# Patient Record
Sex: Female | Born: 1970 | Race: White | Hispanic: No | Marital: Single | Smoking: Current every day smoker
Health system: Southern US, Community
[De-identification: ages and names within clinical notes are randomized; demographics above are authoritative.]

## PROBLEM LIST (undated history)

## (undated) DIAGNOSIS — K7460 Unspecified cirrhosis of liver: Secondary | ICD-10-CM

## (undated) DIAGNOSIS — B1920 Unspecified viral hepatitis C without hepatic coma: Secondary | ICD-10-CM

## (undated) DIAGNOSIS — E785 Hyperlipidemia, unspecified: Secondary | ICD-10-CM

## (undated) DIAGNOSIS — J45909 Unspecified asthma, uncomplicated: Secondary | ICD-10-CM

## (undated) DIAGNOSIS — D649 Anemia, unspecified: Secondary | ICD-10-CM

## (undated) DIAGNOSIS — J449 Chronic obstructive pulmonary disease, unspecified: Secondary | ICD-10-CM

## (undated) DIAGNOSIS — J439 Emphysema, unspecified: Secondary | ICD-10-CM

## (undated) DIAGNOSIS — I10 Essential (primary) hypertension: Secondary | ICD-10-CM

## (undated) DIAGNOSIS — K219 Gastro-esophageal reflux disease without esophagitis: Secondary | ICD-10-CM

## (undated) DIAGNOSIS — F32A Depression, unspecified: Secondary | ICD-10-CM

## (undated) DIAGNOSIS — T7840XA Allergy, unspecified, initial encounter: Secondary | ICD-10-CM

## (undated) DIAGNOSIS — R011 Cardiac murmur, unspecified: Secondary | ICD-10-CM

## (undated) DIAGNOSIS — F419 Anxiety disorder, unspecified: Secondary | ICD-10-CM

## (undated) DIAGNOSIS — F329 Major depressive disorder, single episode, unspecified: Secondary | ICD-10-CM

## (undated) HISTORY — PX: BREAST CYST ASPIRATION: SHX578

---

## 2003-09-07 ENCOUNTER — Other Ambulatory Visit: Payer: Self-pay

## 2007-03-15 ENCOUNTER — Emergency Department: Payer: Self-pay | Admitting: Emergency Medicine

## 2007-03-18 ENCOUNTER — Emergency Department: Payer: Self-pay | Admitting: Emergency Medicine

## 2007-10-09 ENCOUNTER — Inpatient Hospital Stay: Payer: Self-pay | Admitting: Obstetrics and Gynecology

## 2007-11-01 ENCOUNTER — Ambulatory Visit: Payer: Self-pay | Admitting: Internal Medicine

## 2008-01-16 ENCOUNTER — Ambulatory Visit: Payer: Self-pay | Admitting: Obstetrics and Gynecology

## 2008-01-31 ENCOUNTER — Ambulatory Visit: Payer: Self-pay | Admitting: Obstetrics and Gynecology

## 2008-07-07 ENCOUNTER — Ambulatory Visit: Payer: Self-pay | Admitting: Internal Medicine

## 2008-07-14 ENCOUNTER — Ambulatory Visit: Payer: Self-pay | Admitting: Internal Medicine

## 2009-11-19 ENCOUNTER — Ambulatory Visit: Payer: Self-pay | Admitting: Internal Medicine

## 2009-12-01 ENCOUNTER — Ambulatory Visit: Payer: Self-pay | Admitting: Internal Medicine

## 2009-12-02 ENCOUNTER — Ambulatory Visit: Payer: Self-pay | Admitting: Internal Medicine

## 2010-01-12 ENCOUNTER — Ambulatory Visit: Payer: Self-pay | Admitting: Gastroenterology

## 2010-05-30 ENCOUNTER — Ambulatory Visit: Payer: Self-pay | Admitting: Unknown Physician Specialty

## 2010-06-10 ENCOUNTER — Ambulatory Visit: Payer: Self-pay | Admitting: Unknown Physician Specialty

## 2012-02-27 ENCOUNTER — Emergency Department: Payer: Self-pay | Admitting: Emergency Medicine

## 2012-02-27 LAB — URINALYSIS, COMPLETE
Bacteria: NONE SEEN
Bilirubin,UR: NEGATIVE
Ketone: NEGATIVE
Nitrite: NEGATIVE
RBC,UR: 1 /HPF (ref 0–5)
Specific Gravity: 1.013 (ref 1.003–1.030)
Squamous Epithelial: 3
WBC UR: 11 /HPF (ref 0–5)

## 2012-07-01 ENCOUNTER — Ambulatory Visit: Payer: Self-pay | Admitting: Internal Medicine

## 2012-07-03 ENCOUNTER — Ambulatory Visit: Payer: Self-pay | Admitting: Internal Medicine

## 2012-07-06 LAB — COMPREHENSIVE METABOLIC PANEL
Albumin: 1.8 g/dL — ABNORMAL LOW (ref 3.4–5.0)
BUN: 2 mg/dL — ABNORMAL LOW (ref 7–18)
Bilirubin,Total: 2.1 mg/dL — ABNORMAL HIGH (ref 0.2–1.0)
Chloride: 101 mmol/L (ref 98–107)
Co2: 24 mmol/L (ref 21–32)
Creatinine: 0.52 mg/dL — ABNORMAL LOW (ref 0.60–1.30)
EGFR (African American): >60
EGFR (Non-African Amer.): >60
Osmolality: 269 (ref 275–301)
Potassium: 2.2 mmol/L — CL (ref 3.5–5.1)
Sodium: 135 mmol/L — ABNORMAL LOW (ref 136–145)

## 2012-07-06 LAB — URINALYSIS, COMPLETE
Blood: NEGATIVE
Ketone: NEGATIVE
Leukocyte Esterase: NEGATIVE
Ph: 7 (ref 4.5–8.0)
Protein: NEGATIVE
RBC,UR: NONE SEEN /HPF (ref 0–5)
Squamous Epithelial: 1

## 2012-07-06 LAB — CK TOTAL AND CKMB (NOT AT ARMC)
CK, Total: 71 U/L (ref 21–215)
CK-MB: 0.6 ng/mL (ref 0.5–3.6)

## 2012-07-06 LAB — CBC
HCT: 35.2 % (ref 35.0–47.0)
MCHC: 34.4 g/dL (ref 32.0–36.0)
MCV: 111 fL — ABNORMAL HIGH (ref 80–100)
Platelet: 156 10*3/uL (ref 150–440)
RBC: 3.17 10*6/uL — ABNORMAL LOW (ref 3.80–5.20)
RDW: 14 % (ref 11.5–14.5)

## 2012-07-06 LAB — ETHANOL
Ethanol %: 0.347 % (ref 0.000–0.080)
Ethanol: 347 mg/dL

## 2012-07-06 LAB — APTT: Activated PTT: 38.7 secs — ABNORMAL HIGH (ref 23.6–35.9)

## 2012-07-06 LAB — PREGNANCY, URINE: Pregnancy Test, Urine: NEGATIVE m[IU]/mL

## 2012-07-06 LAB — MAGNESIUM: Magnesium: 1.5 mg/dL — ABNORMAL LOW

## 2012-07-06 LAB — TROPONIN I: Troponin-I: <0.02 ng/mL

## 2012-07-06 LAB — PROTIME-INR: Prothrombin Time: 15.9 secs — ABNORMAL HIGH (ref 11.5–14.7)

## 2012-07-07 ENCOUNTER — Inpatient Hospital Stay: Payer: Self-pay | Admitting: Internal Medicine

## 2012-07-07 LAB — BODY FLUID CELL COUNT WITH DIFFERENTIAL
Basophil: 0 %
Eosinophil: 0 %
Nucleated Cell Count: 66 /mm3
Other Cells BF: 0 %

## 2012-07-07 LAB — PROTEIN, BODY FLUID: Protein, Body Fluid: 0.8 g/dL

## 2012-07-07 LAB — IRON AND TIBC
Iron Bind.Cap.(Total): 162 ug/dL — ABNORMAL LOW (ref 250–450)
Iron Saturation: 57 %
Iron: 93 ug/dL (ref 50–170)
Unbound Iron-Bind.Cap.: 69 ug/dL

## 2012-07-07 LAB — FERRITIN: Ferritin (ARMC): 1097 ng/mL — ABNORMAL HIGH (ref 8–388)

## 2012-07-07 LAB — ALBUMIN, FLUID (OTHER): Body Fluid Albumin: 0.3 g/dL

## 2012-07-08 LAB — COMPREHENSIVE METABOLIC PANEL
Anion Gap: 7 (ref 7–16)
BUN: 3 mg/dL — ABNORMAL LOW (ref 7–18)
Calcium, Total: 7 mg/dL — CL (ref 8.5–10.1)
Creatinine: 0.43 mg/dL — ABNORMAL LOW (ref 0.60–1.30)
EGFR (African American): >60
EGFR (Non-African Amer.): >60
Osmolality: 268 (ref 275–301)
SGOT(AST): 184 U/L — ABNORMAL HIGH (ref 15–37)

## 2012-07-08 LAB — MAGNESIUM: Magnesium: 2.1 mg/dL

## 2012-07-09 LAB — BASIC METABOLIC PANEL
Anion Gap: 8 (ref 7–16)
BUN: 2 mg/dL — ABNORMAL LOW (ref 7–18)
Calcium, Total: 7.1 mg/dL — ABNORMAL LOW (ref 8.5–10.1)
Co2: 23 mmol/L (ref 21–32)
Creatinine: 0.45 mg/dL — ABNORMAL LOW (ref 0.60–1.30)
Osmolality: 266 (ref 275–301)

## 2012-07-09 LAB — HEPATIC FUNCTION PANEL A (ARMC)
Bilirubin, Direct: 2 mg/dL — ABNORMAL HIGH (ref 0.00–0.20)
SGOT(AST): 132 U/L — ABNORMAL HIGH (ref 15–37)
SGPT (ALT): 39 U/L (ref 12–78)
Total Protein: 5.9 g/dL — ABNORMAL LOW (ref 6.4–8.2)

## 2012-07-09 LAB — CBC WITH DIFFERENTIAL/PLATELET
Basophil #: 0.1 10*3/uL (ref 0.0–0.1)
Basophil %: 0.7 %
HCT: 27.5 % — ABNORMAL LOW (ref 35.0–47.0)
Lymphocyte #: 2.2 10*3/uL (ref 1.0–3.6)
Lymphocyte %: 25.3 %
MCV: 110 fL — ABNORMAL HIGH (ref 80–100)
Monocyte %: 7.9 %
Neutrophil %: 64.1 %
Platelet: 101 10*3/uL — ABNORMAL LOW (ref 150–440)
RBC: 2.51 10*6/uL — ABNORMAL LOW (ref 3.80–5.20)
RDW: 13.8 % (ref 11.5–14.5)
WBC: 8.8 10*3/uL (ref 3.6–11.0)

## 2012-07-09 LAB — PROTIME-INR: INR: 1.8

## 2012-07-10 LAB — CBC WITH DIFFERENTIAL/PLATELET
Eosinophil #: 0.2 10*3/uL (ref 0.0–0.7)
Eosinophil %: 2.6 %
HCT: 27.2 % — ABNORMAL LOW (ref 35.0–47.0)
Lymphocyte #: 2.3 10*3/uL (ref 1.0–3.6)
MCH: 37.2 pg — ABNORMAL HIGH (ref 26.0–34.0)
MCHC: 33.6 g/dL (ref 32.0–36.0)
MCV: 111 fL — ABNORMAL HIGH (ref 80–100)
Monocyte #: 0.6 x10 3/mm (ref 0.2–0.9)
Neutrophil #: 4.8 10*3/uL (ref 1.4–6.5)
Platelet: 110 10*3/uL — ABNORMAL LOW (ref 150–440)
RDW: 13.8 % (ref 11.5–14.5)
WBC: 8 10*3/uL (ref 3.6–11.0)

## 2012-07-10 LAB — BASIC METABOLIC PANEL
Anion Gap: 8 (ref 7–16)
Calcium, Total: 7.2 mg/dL — ABNORMAL LOW (ref 8.5–10.1)
Co2: 23 mmol/L (ref 21–32)
EGFR (African American): >60
EGFR (Non-African Amer.): >60
Glucose: 86 mg/dL (ref 65–99)
Osmolality: 266 (ref 275–301)
Sodium: 135 mmol/L — ABNORMAL LOW (ref 136–145)

## 2012-07-10 LAB — HEPATIC FUNCTION PANEL A (ARMC)
Albumin: 1.4 g/dL — ABNORMAL LOW (ref 3.4–5.0)
Alkaline Phosphatase: 152 U/L — ABNORMAL HIGH (ref 50–136)
SGOT(AST): 107 U/L — ABNORMAL HIGH (ref 15–37)
SGPT (ALT): 34 U/L (ref 12–78)
Total Protein: 5.8 g/dL — ABNORMAL LOW (ref 6.4–8.2)

## 2012-07-10 LAB — CANCER ANTIGEN 19-9: CA 19-9: 362 U/mL — ABNORMAL HIGH (ref 0–35)

## 2012-07-10 LAB — PROTIME-INR
INR: 1.7
Prothrombin Time: 19.9 secs — ABNORMAL HIGH (ref 11.5–14.7)

## 2012-07-10 LAB — CEA: CEA: 4.4 ng/mL (ref 0.0–4.7)

## 2012-07-11 LAB — AFP TUMOR MARKER: AFP-Tumor Marker: 2.6 ng/mL (ref 0.0–8.3)

## 2012-07-12 LAB — CULTURE, BLOOD (SINGLE)

## 2012-07-22 ENCOUNTER — Ambulatory Visit: Payer: Self-pay | Admitting: Emergency Medicine

## 2012-07-23 LAB — PATHOLOGY REPORT

## 2012-08-01 LAB — URINALYSIS, COMPLETE
Bilirubin,UR: NEGATIVE
Ketone: NEGATIVE
Ph: 6 (ref 4.5–8.0)
RBC,UR: 20 /HPF (ref 0–5)
Specific Gravity: 1.017 (ref 1.003–1.030)
Squamous Epithelial: 13
WBC UR: 9 /HPF (ref 0–5)

## 2012-08-01 LAB — COMPREHENSIVE METABOLIC PANEL
Anion Gap: 6 — ABNORMAL LOW (ref 7–16)
Calcium, Total: 7.8 mg/dL — ABNORMAL LOW (ref 8.5–10.1)
Chloride: 105 mmol/L (ref 98–107)
Co2: 27 mmol/L (ref 21–32)
EGFR (African American): >60
EGFR (Non-African Amer.): >60
Potassium: 4.6 mmol/L (ref 3.5–5.1)
SGOT(AST): 63 U/L — ABNORMAL HIGH (ref 15–37)
SGPT (ALT): 19 U/L (ref 12–78)
Total Protein: 7.4 g/dL (ref 6.4–8.2)

## 2012-08-01 LAB — CBC
HGB: 11.2 g/dL — ABNORMAL LOW (ref 12.0–16.0)
MCH: 34.6 pg — ABNORMAL HIGH (ref 26.0–34.0)
RBC: 3.24 10*6/uL — ABNORMAL LOW (ref 3.80–5.20)
WBC: 6.7 10*3/uL (ref 3.6–11.0)

## 2012-08-01 LAB — LIPASE, BLOOD: Lipase: 103 U/L (ref 73–393)

## 2012-08-02 ENCOUNTER — Inpatient Hospital Stay: Payer: Self-pay | Admitting: Internal Medicine

## 2012-08-03 LAB — COMPREHENSIVE METABOLIC PANEL
Albumin: 1.1 g/dL — ABNORMAL LOW (ref 3.4–5.0)
Alkaline Phosphatase: 75 U/L (ref 50–136)
BUN: 4 mg/dL — ABNORMAL LOW (ref 7–18)
Bilirubin,Total: 1.1 mg/dL — ABNORMAL HIGH (ref 0.2–1.0)
Calcium, Total: 7.3 mg/dL — ABNORMAL LOW (ref 8.5–10.1)
Co2: 28 mmol/L (ref 21–32)
EGFR (African American): >60
Glucose: 83 mg/dL (ref 65–99)
Osmolality: 275 (ref 275–301)
Potassium: 2.8 mmol/L — ABNORMAL LOW (ref 3.5–5.1)
Sodium: 140 mmol/L (ref 136–145)

## 2012-08-03 LAB — CBC WITH DIFFERENTIAL/PLATELET
Basophil %: 0.5 %
Eosinophil %: 11.2 %
HCT: 27.2 % — ABNORMAL LOW (ref 35.0–47.0)
HGB: 8.6 g/dL — ABNORMAL LOW (ref 12.0–16.0)
Lymphocyte #: 2.8 10*3/uL (ref 1.0–3.6)
Lymphocyte %: 36.7 %
Neutrophil #: 2.9 10*3/uL (ref 1.4–6.5)
Neutrophil %: 38.6 %
RDW: 14.9 % — ABNORMAL HIGH (ref 11.5–14.5)
WBC: 7.5 10*3/uL (ref 3.6–11.0)

## 2012-08-04 LAB — BASIC METABOLIC PANEL
Anion Gap: 5 — ABNORMAL LOW (ref 7–16)
Calcium, Total: 7.4 mg/dL — ABNORMAL LOW (ref 8.5–10.1)
Chloride: 105 mmol/L (ref 98–107)
Co2: 30 mmol/L (ref 21–32)
Creatinine: 0.44 mg/dL — ABNORMAL LOW (ref 0.60–1.30)
EGFR (African American): >60
EGFR (Non-African Amer.): >60
Osmolality: 276 (ref 275–301)

## 2012-08-05 LAB — BASIC METABOLIC PANEL
Anion Gap: 4 — ABNORMAL LOW (ref 7–16)
Calcium, Total: 7.5 mg/dL — ABNORMAL LOW (ref 8.5–10.1)
Co2: 30 mmol/L (ref 21–32)
EGFR (African American): >60
EGFR (Non-African Amer.): >60
Osmolality: 268 (ref 275–301)

## 2012-08-05 LAB — MAGNESIUM: Magnesium: 1.8 mg/dL

## 2012-08-06 LAB — BASIC METABOLIC PANEL
Anion Gap: 4 — ABNORMAL LOW (ref 7–16)
Calcium, Total: 7.8 mg/dL — ABNORMAL LOW (ref 8.5–10.1)
Co2: 30 mmol/L (ref 21–32)
Creatinine: 0.54 mg/dL — ABNORMAL LOW (ref 0.60–1.30)
EGFR (African American): >60
EGFR (Non-African Amer.): >60
Glucose: 97 mg/dL (ref 65–99)
Potassium: 3.5 mmol/L (ref 3.5–5.1)

## 2012-12-17 ENCOUNTER — Ambulatory Visit: Payer: Self-pay | Admitting: Gastroenterology

## 2013-03-24 ENCOUNTER — Ambulatory Visit: Payer: Self-pay | Admitting: Gastroenterology

## 2013-03-24 LAB — CBC WITH DIFFERENTIAL/PLATELET
Basophil #: 0 10*3/uL (ref 0.0–0.1)
Basophil %: 0.5 %
Eosinophil #: 0.1 10*3/uL (ref 0.0–0.7)
Eosinophil %: 0.7 %
HCT: 45.3 % (ref 35.0–47.0)
HGB: 16 g/dL (ref 12.0–16.0)
Lymphocyte #: 1.4 10*3/uL (ref 1.0–3.6)
MCV: 96 fL (ref 80–100)
Monocyte #: 0.4 x10 3/mm (ref 0.2–0.9)
Neutrophil #: 4.9 10*3/uL (ref 1.4–6.5)

## 2013-03-24 LAB — PROTIME-INR: Prothrombin Time: 14.5 secs (ref 11.5–14.7)

## 2013-04-18 LAB — PATHOLOGY REPORT

## 2013-05-10 ENCOUNTER — Emergency Department: Payer: Self-pay | Admitting: Emergency Medicine

## 2013-05-10 LAB — COMPREHENSIVE METABOLIC PANEL
Albumin: 3.5 g/dL (ref 3.4–5.0)
Anion Gap: 13 (ref 7–16)
Bilirubin,Total: 0.5 mg/dL (ref 0.2–1.0)
Calcium, Total: 8.6 mg/dL (ref 8.5–10.1)
Chloride: 103 mmol/L (ref 98–107)
Co2: 21 mmol/L (ref 21–32)
Creatinine: 0.63 mg/dL (ref 0.60–1.30)
EGFR (African American): >60
EGFR (Non-African Amer.): >60
Glucose: 92 mg/dL (ref 65–99)
SGOT(AST): 31 U/L (ref 15–37)
Sodium: 137 mmol/L (ref 136–145)
Total Protein: 7.4 g/dL (ref 6.4–8.2)

## 2013-05-10 LAB — DRUG SCREEN, URINE
Amphetamines, Ur Screen: NEGATIVE (ref ?–1000)
MDMA (Ecstasy)Ur Screen: NEGATIVE (ref ?–500)
Methadone, Ur Screen: NEGATIVE (ref ?–300)
Phencyclidine (PCP) Ur S: NEGATIVE (ref ?–25)
Tricyclic, Ur Screen: NEGATIVE (ref ?–1000)

## 2013-05-10 LAB — CBC
HCT: 40.4 % (ref 35.0–47.0)
MCH: 34.4 pg — ABNORMAL HIGH (ref 26.0–34.0)
MCHC: 35.7 g/dL (ref 32.0–36.0)
RBC: 4.2 10*6/uL (ref 3.80–5.20)
RDW: 12.7 % (ref 11.5–14.5)
WBC: 8.1 10*3/uL (ref 3.6–11.0)

## 2013-05-10 LAB — ETHANOL: Ethanol: 119 mg/dL

## 2013-12-08 ENCOUNTER — Ambulatory Visit: Payer: Self-pay | Admitting: Internal Medicine

## 2014-06-19 ENCOUNTER — Ambulatory Visit: Payer: Self-pay | Admitting: Gastroenterology

## 2014-06-19 ENCOUNTER — Ambulatory Visit: Payer: Self-pay | Admitting: Internal Medicine

## 2014-10-02 ENCOUNTER — Emergency Department: Payer: Self-pay | Admitting: Emergency Medicine

## 2014-11-24 NOTE — Consult Note (Signed)
Chief Complaint:   Subjective/Chief Complaint seen for cirrhosis/ascites.  paiten doing well, diuresing at apprpriate rate.   still some mild flank abdominal discomfort.   VITAL SIGNS/ANCILLARY NOTES: **Vital Signs.:   30-Dec-13 14:15   Vital Signs Type Routine   Temperature Temperature (F) 98.1   Celsius 36.7   Temperature Source oral   Pulse Pulse 95   Respirations Respirations 20   Systolic BP Systolic BP 282   Diastolic BP (mmHg) Diastolic BP (mmHg) 51   Mean BP 68   Pulse Ox % Pulse Ox % 93   Pulse Ox Activity Level  At rest   Oxygen Delivery Room Air/ 21 %   Brief Assessment:   Cardiac Regular    Respiratory clear BS    Gastrointestinal details normal Soft  Nontender  Nondistended  No masses palpable  Bowel sounds normal   Lab Results: Routine Chem:  30-Dec-13 05:09    Magnesium, Serum 1.8 (1.8-2.4 THERAPEUTIC RANGE: 4-7 mg/dL TOXIC: > 10 mg/dL  -----------------------)   Glucose, Serum 83   BUN  4   Creatinine (comp)  0.46   Sodium, Serum 136   Potassium, Serum  3.3   Chloride, Serum 102   CO2, Serum 30   Calcium (Total), Serum  7.5   Anion Gap  4   Osmolality (calc) 268   eGFR (African American) >60   eGFR (Non-African American) >60 (eGFR values <78m/min/1.73 m2 may be an indication of chronic kidney disease (CKD). Calculated eGFR is useful in patients with stable renal function. The eGFR calculation will not be reliable in acutely ill patients when serum creatinine is changing rapidly. It is not useful in  patients on dialysis. 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  now abstinent for about 3and a half weeks.   2) ascites, edema-hypoalbuminemia/cirrhosis    Plan 1) continue current, I anticipate d/c soon, consider physical therapy.   Electronic  Signatures: SLoistine Simas(MD)  (Signed 30-Dec-13 20:09)  Authored: Chief Complaint, VITAL SIGNS/ANCILLARY NOTES, Brief Assessment, Lab Results, Assessment/Plan   Last Updated: 30-Dec-13 20:09 by SLoistine Simas(MD)

## 2014-11-24 NOTE — Consult Note (Signed)
Chief Complaint:   Subjective/Chief Complaint continues to diurese well, less abdominal pain, mostly to the flanks.  no n/v, tolerating regular diet po.  about 6-7# wt loss c/w I+O.   VITAL SIGNS/ANCILLARY NOTES: **Vital Signs.:   28-Dec-13 06:43   Vital Signs Type Routine   Temperature Temperature (F) 99.6   Celsius 37.5   Temperature Source oral   Pulse Pulse 82   Respirations Respirations 18   Systolic BP Systolic BP 99   Diastolic BP (mmHg) Diastolic BP (mmHg) 62   Mean BP 74   Pulse Ox % Pulse Ox % 92   Pulse Ox Activity Level  At rest   Oxygen Delivery Room Air/ 21 %  *Intake and Output.:   28-Dec-13 05:05   Current Weight (lbs) (lbs) 174.4    07:51   Current Weight (lbs) (lbs) 171   Brief Assessment:   Cardiac Regular    Respiratory clear BS    Gastrointestinal details normal Soft  Bowel sounds normal  No rebound tenderness  less distended, mild fluid wave, mild flan tenderness, mostly left.   Lab Results: Hepatic:  28-Dec-13 04:24    Bilirubin, Total  1.1   Alkaline Phosphatase 75   SGPT (ALT) 12   SGOT (AST) 29   Total Protein, Serum  5.9   Albumin, Serum  1.1  Routine Chem:  28-Dec-13 04:24    Glucose, Serum 83   BUN  4   Creatinine (comp)  0.44   Sodium, Serum 140   Potassium, Serum  2.8   Chloride, Serum 103   CO2, Serum 28   Calcium (Total), Serum  7.3   Osmolality (calc) 275   eGFR (African American) >60   eGFR (Non-African American) >60 (eGFR values <29m/min/1.73 m2 may be an indication of chronic kidney disease (CKD). Calculated eGFR is useful in patients with stable renal function. The eGFR calculation will not be reliable in acutely ill patients when serum creatinine is changing rapidly. It is not useful in  patients on dialysis. The eGFR calculation may not be applicable to patients at the low and high extremes of body sizes, pregnant women, and vegetarians.)   Anion Gap 9  Routine Hem:  28-Dec-13 04:24    WBC (CBC) 7.5   RBC (CBC)   2.75   Hemoglobin (CBC)  8.6   Hematocrit (CBC)  27.2   Platelet Count (CBC) 174   MCV 99   MCH 31.4   MCHC  31.7   RDW  14.9   Neutrophil % 38.6   Lymphocyte % 36.7   Monocyte % 13.0   Eosinophil % 11.2   Basophil % 0.5   Neutrophil # 2.9   Lymphocyte # 2.8   Monocyte #  1.0   Eosinophil #  0.8   Basophil # 0.0 (Result(s) reported on 03 Aug 2012 at 05:41AM.)   Assessment/Plan:  Assessment/Plan:   Assessment 1) 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  abstinent per patient for about 3 weeks.  Patietn with ascites, marked hypoalbuminemia.    Plan 1) will change iv lasix to bid po, continue current aldactone.  recheck electrolytes in am. addition of potassium supplement noted. diuresis goal about 1+1/2 to 2 # a day.   Electronic Signatures: SLoistine Simas(MD)  (Signed 28-Dec-13 13:44)  Authored: Chief Complaint, VITAL SIGNS/ANCILLARY NOTES, Brief Assessment, Lab Results, Assessment/Plan   Last Updated: 28-Dec-13 13:44 by SLoistine Simas(MD)

## 2014-11-24 NOTE — Consult Note (Signed)
Chief Complaint:   Subjective/Chief Complaint seen for cirrhosis.  doing well, continues to diurese, less abdominal distension and discomfort. tolerating po.   VITAL SIGNS/ANCILLARY NOTES: **Vital Signs.:   29-Dec-13 05:04   Vital Signs Type Routine   Temperature Temperature (F) 98.1   Celsius 36.7   Temperature Source Oral   Pulse Pulse 82   Respirations Respirations 18   Systolic BP Systolic BP 94   Diastolic BP (mmHg) Diastolic BP (mmHg) 55   Mean BP 68   Pulse Ox % Pulse Ox % 92   Pulse Ox Activity Level  At rest   Oxygen Delivery Room Air/ 21 %  *Intake and Output.:   29-Dec-13 06:58   Current Weight (lbs) (lbs) 169.6   Brief Assessment:   Cardiac Regular    Respiratory clear BS    Gastrointestinal details normal Soft  Bowel sounds normal  No rebound tenderness  mild discomfort to palpatient in the flanks, left more than right   Lab Results: Routine Chem:  29-Dec-13 04:49    Magnesium, Serum  1.5 (1.8-2.4 THERAPEUTIC RANGE: 4-7 mg/dL TOXIC: > 10 mg/dL  -----------------------)   Glucose, Serum 85   BUN  4   Creatinine (comp)  0.44   Sodium, Serum 140   Potassium, Serum 3.8   Chloride, Serum 105   CO2, Serum 30   Calcium (Total), Serum  7.4   Anion Gap  5   Osmolality (calc) 276   eGFR (African American) >60   eGFR (Non-African American) >60 (eGFR values <40m/min/1.73 m2 may be an indication of chronic kidney disease (CKD). Calculated eGFR is useful in patients with stable renal function. The eGFR calculation will not be reliable in acutely ill patients when serum creatinine is changing rapidly. It is not useful in  patients on dialysis. The eGFR calculation may not be applicable to patients at the low and high extremes of body sizes, pregnant women, and vegetarians.)   Result Comment POTASSIUM/MAGNESIUM - Slight hemolysis, interpret results with  - caution.  Result(s) reported on 04 Aug 2012 at 05:44AM.   Assessment/Plan:  Assessment/Plan:    Assessment 1) childs pugh class b-c cirrhosis secondary to h/o etoh abuse.  diuresisng well, now on oral agents.  will increase aldactone to 100 mg po daily, continue bid lasix at current dose.    Plan 1) as above.  will need GI fu as op about 2 weeks post d/c.  following.   Electronic Signatures: SLoistine Simas(MD)  (Signed 29-Dec-13 13:45)  Authored: Chief Complaint, VITAL SIGNS/ANCILLARY NOTES, Brief Assessment, Lab Results, Assessment/Plan   Last Updated: 29-Dec-13 13:45 by SLoistine Simas(MD)

## 2014-11-24 NOTE — H&P (Signed)
PATIENT NAME:  Susan [name], Susan [name] MR#:  [id number] DAT[facility] Guest OF BIRTH:  [birth year]  DATE OF ADMISSION:  07/07/2012   PRIMARY CARE PROVIDER: [facility]   CHIEF COMPLAINT: Abdominal swelling and bilateral lower extremity swelling.   HISTORY OF PRESENT ILLNESS: The patient is a 44 year old female with history of daily alcohol use and tobacco abuse who presents with increased abdominal girth and bilateral lower extremity swelling she says for at least a month. The patient notes that prior to the increase in abdominal girth she had had a 10 to 15 pound weight loss over the preceding 6 to 7 months but has since regained all that weight in her "stomach and feet". She notes that she now has easy bruising. She admits to abdominal pain described as a tightness at the lower abdomen, nonradiating in nature, as well as some intermittent pains in the right upper quadrant. This pain is dull and intermittent in nature and again is nonradiating. She admits to some hot and cold flashes but denies any frank fevers.   Of note, she had a mammogram done recently which showed a lesion on the right breast and a follow-up ultrasound was obtained which showed a 10 mm lesion so she has been advised to get a tissue biopsy of that site.   Upon evaluation in the Emergency Department, the patient is noted to be tachycardic, mildly jaundiced. Initial abdominal ultrasound obtained showed abdominal ascites with inability to visualize the portal vein as well as multiple metabolic derangements so we are called for admission.   Regarding her drinking, the patient [facility]   PAST MEDICAL HISTORY:  1. Depression, anxiety.  2. Hyperlipidemia.  3. Asthma.   MEDICATIONS:  1. Clonazepam 1 mg twice a day.   2. Lexapro 10 mg daily.  3. Percocet.  4. Of note, the patient admits that she should be on simvastatin 20 mg at bedtime but has been noncompliant with that.  5. Trilipix. She has been noncompliant  with and actually she never started this medication.  6. Amlodipine which she notes due to normal blood pressures has been discontinued for the past year.   ALLERGIES: No known drug allergies but she does say that she had a reaction to over-the-counter flu medicine but she cannot recall the medication nor the reaction.    SOCIAL HISTORY: The patient smokes about a pack a day for at least 15 years. She drinks seven drinks weekly. She notes that it's usually vodka mixed, maybe a glass to a glass and a half daily. She denies illicits. She has two children age 574 and 4911. She is unemployed.   FAMILY HISTORY: Notable for cancer in multiple family members. Mother had lung cancer that metastasized to liver, pancreas, brain. Maternal grandmother had breast cancer. Father and uncles have skin cancer. She has aunts that have skin cancer as well. Paternal grandmother had what sounds like metastasized cancer. Paternal grandfather had possible stroke or heart attack. No family history of blood clots.   REVIEW OF SYSTEMS: CONSTITUTIONAL: Denies frank fevers although she admits to intermittent hot and cold flashes. Admits to fatigue. Admits to about 10 to 15 pound weight loss and then subsequent significant weight gain. EYES: Admits to some blurred vision. Denies eye pain. ENT: Denies tinnitus, ear pain. Admits to postnasal drip. RESPIRATORY: Admits to cough, wheezing. CARDIOVASCULAR: Denies chest pain. Admits to orthopnea, edema. Denies dyspnea on exertion. GI: Admits to nausea and one episode of emesis today,  bilious, nonbloody. Admits to abdominal pain. Denies hematemesis, melena, or rectal bleeding. Denies clay colored stools. GU: Denies dysuria, frequency. HEME: Admits to easy bruising. INTEGUMENTARY: Admits to new skin lesions, mostly bruising. MUSCULOSKELETAL: Denies muscle pain, arthralgias, or joint swelling. NEUROLOGIC: Admits to some headaches. Denies tremors. PSYCH: Admits to anxiety and depression.   PHYSICAL  EXAMINATION:   VITAL SIGNS: Temperature 98, heart rate ranged between 101 to 130, respirations 19, blood pressure 113/80.   GENERAL: Caucasian female in no apparent distress.   EYES: There is scleral icterus bilaterally. Pupils are equal, round, and reactive to light and accommodation.   ENT: Normal external ears and nares. Posterior oropharynx is clear. There is mild sublingual jaundice.   SKIN: There are telangiectasias noted over the cheeks bilaterally. She has mild ecchymoses on bilateral upper extremity particularly on the left forearm. There is no jaundice beyond her eyes and tongue.   SKIN: Warm and dry.   CARDIOVASCULAR: S1, S2, tachycardic. There is 2+ edema bilaterally.   CHEST: Clear to auscultation bilaterally. No wheezes, rales, or rhonchi. Normal effort.   ABDOMEN: Distended. There is ascites noted. No hepatomegaly appreciated.   MUSCULOSKELETAL: No clubbing. No cyanosis. Full range of motion in all extremities.   PSYCH: The patient is awake, alert, oriented x3. Judgment appears intact. She was tearful intermittently during the exam.   LABORATORY DATA: Glucose 154, BUN 2, creatinine 0.52, sodium 135, potassium 3.2, chloride 101, bicarb 24, calcium 7.4, bilirubin 2.1, alkaline phosphatase 196, ALT 57, AST 201, total protein 7.7, albumin 1.8, osmolality 269, anion gap 10, GFR greater than 60, white count 12.7, hemoglobin 12.1, hematocrit 35.2, platelet 156 with an MCV of 111. Troponin is less than 0.02. Urinalysis is negative. Magnesium is low at 1.5.   Pregnancy test is negative. Ethanol level is significantly elevated at 347. Acetaminophen is less than 2.   EKG shows sinus tachycardia at 104 beats per minute. There is low voltage diffusely.  Abdominal ultrasound shows ascites. Gallbladder is normal. Liver demonstrates increased echotexture. No flow is demonstrated in the portal vein.   Bilateral lower extremity Doppler's show no evidence of thrombus within the right or  left femoral or popliteal veins.   INR 1.2 with a PTT of 38.7.   ASSESSMENT AND PLAN: This is a 44 year old female presenting with new onset ascites as well as bilateral lower extremity swelling concerning for acute liver failure versus progressive liver disease versus malignancy.  1. New onset ascites. Will obtain CT of the abdomen as well as diagnostic and therapeutic ultrasound-guided paracentesis. Will check blood cultures. If SBP is noted on ascitic fluid studies, will start antibiotics. Hold antibiotics at this time as the patient is nontoxic and is currently afebrile. Will check a CA-19-9, CEA, and ascitic fluid cytology.  2. Concern for portal vein thrombosis. Will follow-up CT of the abdomen and if thrombosis start full dose anticoagulation and possible Oncology consult.  3. Right breast mass. Given concern for possible underlying malignancy, the patient may need inpatient tissue biopsy for work-up during this hospital stay. Will hold off for now until abdominal studies are completed.  4. Alcohol dependence. Will initiate CIWA and replace vitamins.  5. Hyperbilirubinemia. This is mild. Will follow-up the abdominal CT. 6. Transaminitis, suggests chronic alcohol use. The patient does minimize her alcohol use. Again, will continue CIWA protocol.  7. Hypokalemia. This has been repleted.  8. Hypomagnesemia, repleted. This is most likely result of chronic alcohol use.  9. Leukocytosis. There is the possibility of underlying  SBP. Will follow-up studies as above and initiate antibiotics as discussed.   10. Hypoalbuminemia.  11. Macrocytic anemia most likely secondary to chronic alcohol. Will check iron studies and replace as indicated.  12. Anxiety/depression. Resume home medications.   13. Tobacco abuse. Nicotine patch.  14. Hyperlipidemia. Statin.  15. Prophylaxis. Lovenox for DVT prophylaxis. If portal vein thrombosis is diagnosis, will change to therapeutic anticoagulation.   CODE STATUS:  FULL CODE. Surrogate decision maker is her father, Rondell Reams, phone number [phone].   Disposition. Patient is being admitted inpatient for management of new onset ascites.      TIME SPENT COORDINATING ADMISSION: Greater than 60 minutes.   ____________________________ Aurther Loft, DO aeo:drc D: 07/07/2012 01:24:08 ET T: 07/07/2012 10:29:04 ET JOB#: [id number]  cc: Aurther Loft, DO, <Dictator> [facility], [facility] Terese Heier E Lama Narayanan DO ELECTRONICALLY SIGNED 07/08/2012 4:43

## 2014-11-24 NOTE — Consult Note (Signed)
Brief Consult Note: Diagnosis: Cirrhosis, secondary to etoh abuse.   Patient was seen by consultant.   Recommend further assessment or treatment.   Comments: Patient seen and examined, full consult to follow.  Patient known to me.  She was seen in tghe outpatient clinic last week after a hospitalization in early Dec 2013, for etoh related hepatopathy and edema.  Patient has probable childs pugh class b-c cirrhosis, whis hypoalbuminemia.  She came to the er with c/o abdominal pain.  I had given her a limited rx of tramadol as o/p last week.  On examination today, she looks improved from last week, although she contin ue to have marked peripheral edema and moderate (less than last week) ascites.  No asterixis.  Patient non-compliant with medication change last week.  Continue current, will recheck electrolytes tomorrow am, discussed compliance issues.  No paracentesis at present as she is actually improved frpm the last time I saw her.   Following.  Electronic Signatures for Addendum Section:  Barnetta ChapelSkulskie, Martin (MD) (Signed Addendum 27-Dec-13 11:12)  see consult # [phone]   Electronic Signatures: Barnetta ChapelSkulskie, Martin (MD)  (Signed 27-Dec-13 10:04)  Authored: Brief Consult Note   Last Updated: 27-Dec-13 11:12 by Barnetta ChapelSkulskie, Martin (MD)

## 2014-11-24 NOTE — Consult Note (Signed)
Brief Consult Note: Diagnosis: Ascites, lower extremity swelling.   Patient was seen by consultant.   Consult note dictated.   Comments: Appreciate consult for 44 y/o caucasian woman with history of chronic daily alcohol use of 2-3 mixed drinks/d  for multiple years, for evaluation of cirrhosis and abdominal pain. Patient reports this is the first time she has ever been jaundiced and had ascities/lower extremity swelling, and has no known history of liver disease. 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 Denies illicits, foreign travel, blood transfusions, military service, history of viral hepatitis, and family history of liver disease.  Does have some elevation of liver enzymes, with AST> ALT but less than 300.  Bilirubin elevated. Platelet count normal. Some elevation to PT. Has undergone paracentesis, US, CT since admission. Paracentesis suggestive of portal htn.  Spironolactone started. patient states she is  some better since paracentesis: breathing improved, and has less abdominal pain- states it is located in the RUQ. Does report intermittent mild nausea, had vomited some at home, but this is better as well. Takes Tums sometimes for acid reflux symptoms.  States no trouble swallowing, and that she has a bowel movement most every day. Denies black/tarry/bloody stools. Also reports  fatigue, intermittent weakness. EGD 2011 with no varices.  Impression and plan. Cirrhosis: likely alcoholic. Did strongly recommend sobriety. States she has help with this at home. Use AA as adjunct. Of note, in chart review, patient w/ at least 2 prior admissions to psych regarding etoh and substance abuse.   Will also start SBP prophylaxis, daily wts, I&0, daily liver panel/pt/inr.  Will add liver serologies to include viral hepatitis, autoimmune, & inherited liver disorder, AFP.                                Ascites: Agree w/ Spironolactone therapy and 2gm Na  diet. Would maximize diuretic therapy and consider adding Lasix as patient tolerates: Needs to have electrolytes corrected & start with low dose. Received K+ twice today. May need taps in future.  Electronic Signatures: Susan [name], Susan [name] (NP)  (Signed 02-Dec-13 17:48)  Authored: Brief Consult Note   Last Updated: 02-Dec-13 17:48 by Keturah BarreLondon, Perel Hauschild [name] (NP)

## 2014-11-24 NOTE — H&P (Signed)
PATIENT NAME:  Susan [name], Susan [name] MR#:  [id number] DATE OF BIRTH:  [birth year]  DATE OF ADMISSION:  08/02/2012  REFERRING PHYSICIAN: Dr. Lenise Arena.   PRIMARY CARE PHYSICIAN: Dr. Lamonte Sakai.    CHIEF COMPLAINT: Anasarca.   HISTORY OF PRESENT ILLNESS: This is a 44 year old female with recent diagnosis of alcoholic liver disease resulting in ascites and anasarca who was discharged from [facility] December 4 status post ultrasound-guided thoracentesis with results negative for spontaneous bacterial peritonitis. The patient was discharged home. The patient reports she has increase in her swelling and prompted her to come to the ED. The patient saw Dr. Gustavo Lah on December 18 where he increased her Aldactone from 25 once a day to 50 once a day and her Lasix from 20 to 40 once a day. The patient reports she only started taking the new dosage yesterday. The patient reports she quit drinking since her last hospitalization. Since then, has no alcohol intake at all. The patient complains of mild abdominal pain. She reported this is something that has been going on for a while. It did not increase recently. The patient has mildly elevated AST at 63. The patient denies any chest pain, shortness of breath, coffee-ground emesis, nausea, vomiting, diarrhea, constipation. Reports she has chronic lower extremity swelling which has gotten worse recently. The patient had complete workup done by Dr. Gustavo Lah where she is hep C antibody negative. She has high RNP antibodies but negative Smith and other antibodies, including antiscleroderma, Sjogren A and B and antichromatin   anticentromere, and hepatitis B antibody was nonreactive, as well as negative hepatitis A antibody. Hospitalist service was requested to admit the patient for further aggressive diuresis and workup for the patient's worsening anasarca.   PAST MEDICAL HISTORY:  1. Depression.  2. Anxiety.  3. Hyperlipidemia.  4. Asthma.  5. Congenital  heart murmur.  6. Portal hypertension with imaging studies consistent with cirrhosis of liver. most likely due to alcohol.   SURGICAL HISTORY:  1. Uterine ablation.  2. Fractured pelvis.  3. C-section.  4. Tubes in her ears, myringotomy.   HOME MEDICATIONS:  1. Prilosec 40 mg oral daily.  2. Xanax 0.5 twice a day as needed.  3. Aldactone 50 mg oral daily. The patient starting taking this dose only yesterday.  4. Trilipix 145 mg oral daily.  5. Potassium chloride 20 mEq oral daily.  6. Lasix 40 mg oral daily, recently started this dose yesterday.  7. Citalopram 20 mg oral daily.  8. Cipro 750 mg oral once a week.  9. Acetaminophen/hydrocodone 325/5 one 1 tablet every 6 hours as needed for pain.   ALLERGIES: No known drug allergies.   SOCIAL HISTORY: The patient smokes 1 pack per day. The patient quit drinking during the last hospitalization. She denies any illicit drug use.   FAMILY HISTORY: Significant for cancer in multiple family members, lung cancer in her mother, father and uncles have skin cancer.   REVIEW OF SYSTEMS:  CONSTITUTIONAL: The patient denies any fever or chills. Complains of generalized weakness and fatigue.  EYES: Denies blurry vision, double vision or pain.  ENT: Denies tinnitus, ear pain, hearing loss.  RESPIRATORY: Denies cough, wheezing, hemoptysis or dyspnea.  CARDIOVASCULAR: Denies chest pain, arrhythmia, palpitations, syncope. Has lower extremity edema.  GASTROINTESTINAL: Denies nausea, vomiting, diarrhea, hematemesis, melena, jaundice. Complains of mild abdominal pain.  GENITOURINARY: Denies dysuria, hematuria, renal colic.  ENDOCRINE: Denies polyuria, polydipsia, heat or cold intolerance.  HEMATOLOGY: Denies anemia, easy bruising, bleeding diathesis.  INTEGUMENTARY: Denies acne, rash or skin lesions.  MUSCULOSKELETAL: Denies gout, limited activity, arthritis or cramps.  NEUROLOGICAL: Denies numbness, dysarthria, epilepsy, tremors, vertigo, CVA, TIA or  seizures.  PSYCHIATRIC: Denies insomnia, schizophrenia, nervousness. Has history of depression. Had alcohol abuse but none currently.   PHYSICAL EXAMINATION:  VITAL SIGNS: Temperature 98.1, pulse 95, respiratory rate 20, blood pressure 104/53, saturating 97% on room air.  GENERAL: Well-nourished female, looks comfortable with no apparent distress.  HEENT: Head atraumatic, normocephalic. Pupils equal, reactive to light. Pink conjunctivae. Anicteric sclerae. Moist oral mucosa.  NECK: Supple. No thyromegaly. No JVD.  CHEST: Good air entry bilaterally. No wheezing, rales, rhonchi.  CARDIOVASCULAR: S1, S2 heard. No rubs, murmurs or gallops.  ABDOMEN: Has ascites. No organomegaly. Good bowel sounds. No tenderness. No rebound. No guarding.  EXTREMITIES: Edema +3 bilaterally.  PSYCHIATRIC: Appropriate affect. Awake, alert, oriented x3. Intact judgment and insight. NEUROLOGIC: Cranial nerves grossly intact. Motor 5/5 in all extremities.  SKIN: Normal skin turgor. Warm and dry.   PERTINENT LABS: Glucose 95, BUN 4, creatinine 0.57, sodium 138, potassium 4.6, chloride 105, CO2 27. Total protein 7.4. Albumin 1.5. Total bili 1, alk phos 97, AST 63, ALT 19. White blood cells 6.7, hemoglobin 11.2, hematocrit 32.8, platelets 205. Urinalysis was contaminated as showing epithelial cells 13.    ASSESSMENT AND PLAN: A 44 year old female who presents with worsening anasarca and ascites. Noncompliance with recently increased diuretic dosage. She is known to have portal hypertension and alcoholic liver disease.  1. Anasarca and ascites: This is due to her alcoholic liver disease. The patient will be continued on 50 mg of Aldactone daily. Will start her on intravenous Lasix 40 mg every 12 hours. It is unlikely the patient is having spontaneous bacterial peritonitis as she is afebrile, has no leukocytosis, abdominal examination is benign and had recent paracentesis with negative workup for spontaneous bacterial peritonitis  (SBP). The patient will be on strict ins and outs and will consult Dr. Gustavo Lah for gastroenterology to see if the patient needs paracentesis or not.  2. Tobacco use: The patient was counseled. Will start on nicotine patch.  3. History of alcohol abuse: The patient to quit drinking during her last hospitalization earlier this month.  4.depression: Continue with home meds.  5. Hyperlipidemia: Continue with Trilipix.  6. Deep vein thrombosis prophylaxis: Continue with sequential compression device for the time being. Will avoid starting any subcutaneous heparin as she might need ultrasound-guided paracentesis once evaluated by gastroenterology.   CODE STATUS: The patient is FULL CODE.   TOTAL TIME SPENT ON ADMISSION AND PATIENT CARE: 45 minutes.   ____________________________ Albertine Patricia, MD dse:gb D: 08/02/2012 02:20:02 ET T: 08/02/2012 06:00:06 ET JOB#: [id number]  cc: Albertine Patricia, MD, <Dictator> Mako Pelfrey Graciela Husbands MD ELECTRONICALLY SIGNED 08/03/2012 0:35

## 2014-11-24 NOTE — Discharge Summary (Signed)
PATIENT NAME:  Susan [name], [name] MR#:  [id number] DATE OF BIRTH:  [birth year]  DATE OF ADMISSION:  07/07/2012 DATE OF DISCHARGE:  07/10/2012   DISCHARGE DIAGNOSES:  1. Alcoholic liver cirrhosis.  2. Portal hypertension.  3. Ascites.  4. Electrolyte abnormalities.   PROCEDURES:  1. Abdominal paracentesis, ultrasound-guided.  2. Abdominal ultrasound.  3. CT scan of the abdomen and pelvis x2.   CONSULTATION: Gastroenterology, Dr. Marva PandaSkulskie    DISCHARGE MEDICATIONS: The patient is to resume home medications. Additional medications prescribed are:  1. Aldactone 25 mg daily.  2. Ciprofloxacin 500 mg once weekly.  3. Lasix 10 mg daily.  4. Potassium chloride 20 mEq twice a day x3 days.   HOSPITAL COURSE: This lady was admitted through the Emergency Room where she presented with abdominal pain, distention, and bilateral leg swelling. Please refer to the history and physical for full details. She was essentially diagnosed with ascites, liver cirrhosis, and admitted to the hospital by the hospitalist service for further treatment. She underwent ultrasound-guided paracentesis during which a liter of fluid was removed. Subsequent biochemical cytological analysis was negative for spontaneous bacterial peritonitis as suggested portal hypertension as the etiology. The patient'Soley Harriss symptoms improved following her paracentesis. I subsequently added spironolactone for additional diuresis. I consulted Gastroenterology, Dr. Marva PandaSkulskie, whose clinical impression was alcoholic liver cirrhosis with portal hypertension. Recommendations were to add Lasix and agreed with the line of management. The patient continued to improve clinically. She did exhibit marked hypokalemia which was addressed with potassium supplementation without any complications. The patient was advised on withdrawal from alcohol consumption to which she agreed. She states that she had started seeing [facility] prior to her hospitalization. The  patient was discharged home in satisfactory condition.   DIET: Low sodium.   ACTIVITY: No restrictions.   FOLLOW-UP:  1. Follow-up with Dr. Yves DillNeelam Khan in 1 to 2 weeks. 2. Follow-up with Dr. Marva PandaSkulskie in 1 to 2 weeks.   DISCHARGE PROCESS TIME SPENT: 35 minutes.   ____________________________ Silas FloodSheikh A. Ellsworth Lennoxejan-Sie, MD sat:drc D: 07/10/2012 13:46:36 ET T: 07/10/2012 14:08:18 ET JOB#: [id number]  cc: Marland McalpineSheikh A. Ellsworth Lennoxejan-Sie, MD, <Dictator> Charlesetta GaribaldiSHEIKH A TEJAN-SIE MD ELECTRONICALLY SIGNED 07/13/2012 8:58

## 2014-11-24 NOTE — Consult Note (Signed)
Chief Complaint:   Subjective/Chief Complaint seen for cirrhosis.  doing well, continues to diurese.  no change, tolerating po.   VITAL SIGNS/ANCILLARY NOTES: **Vital Signs.:   31-Dec-13 13:24   Vital Signs Type Routine   Temperature Temperature (F) 98.1   Celsius 36.7   Temperature Source AdultAxillary   Pulse Pulse 85   Respirations Respirations 18   Systolic BP Systolic BP 100   Diastolic BP (mmHg) Diastolic BP (mmHg) 66   Mean BP 77   Pulse Ox % Pulse Ox % 95   Pulse Ox Activity Level  At rest   Oxygen Delivery Room Air/ 21 %   Brief Assessment:   Cardiac Regular    Respiratory clear BS    Gastrointestinal details normal Soft  Nondistended  No masses palpable  Bowel sounds normal  minimal tenderness on abdominal flanks.   Assessment/Plan:  Assessment/Plan:   Assessment 1)  childs pugh class b-c cirrhosis-ascites, hypoalbuminemia.  diuresing.  2) h/o etoh abuse-no etoh for about 4 weeks.    Plan 1) continue current diuretics, ok to d/c, may need Physical therapy.  FU GI in 1-2 weeks, will need to check electrolytes.   Electronic Signatures: Barnetta ChapelSkulskie, Jamari Diana (MD)  (Signed 31-Dec-13 14:14)  Authored: Chief Complaint, VITAL SIGNS/ANCILLARY NOTES, Brief Assessment, Assessment/Plan   Last Updated: 31-Dec-13 14:14 by Barnetta ChapelSkulskie, Man Bonneau (MD)

## 2014-11-24 NOTE — Consult Note (Signed)
Chief Complaint:   Subjective/Chief Complaint stable, some diarrhea today,  probably from CT contrast.  abdomen still distended with ascites.  less discomfort.   VITAL SIGNS/ANCILLARY NOTES: **Vital Signs.:   03-Dec-13 13:34   Vital Signs Type Routine   Temperature Temperature (F) 98   Celsius 36.6   Temperature Source Oral   Pulse Pulse 94   Respirations Respirations 18   Systolic BP Systolic BP 96   Diastolic BP (mmHg) Diastolic BP (mmHg) 68   Mean BP 77   Pulse Ox % Pulse Ox % 98   Pulse Ox Activity Level  At rest   Oxygen Delivery Room Air/ 21 %  *Intake and Output.:   03-Dec-13 15:05   Current Weight (lbs) (lbs) 161.7   Brief Assessment:   Cardiac Regular    Respiratory clear BS    Gastrointestinal details normal No rebound tenderness  protuberant, postiive fluid wave, bs positive, mild discomfort to palpation.   Lab Results: Hepatic:  03-Dec-13 04:33    Bilirubin, Total  3.6   Bilirubin, Direct  2.0 (Result(s) reported on 09 Jul 2012 at 05:24AM.)   Alkaline Phosphatase  158   SGPT (ALT) 39   SGOT (AST)  132   Total Protein, Serum  5.9   Albumin, Serum  1.4  General Ref:  02-Dec-13 18:31    ANA Comprehensive Panel ========== TEST NAME ==========  ========= RESULTS =========  = REFERENCE RANGE =  ANA COMPREHENSIVE PANEL  ANA Comprehensive Panel Anti-DNA (DS) Ab Qn             [   2 IU/mL              ]               0-9               Negative      <5                                                   Equivocal  5 - 9                                                   Positive      >9 RNP Antibodies                  [H  3.1 AI               ]           0.0-0.9 Smith Antibodies                [   <0.2 AI              ]           0.0-0.9 Antiscleroderma-70 Antibodies   [   <0.2 AI              ]           0.0-0.9 Sjogren's Anti-SS-A             [   <0.2 AI              ]  0.0-0.9 Sjogren's Anti-SS-B          [   0.2 AI               ]            0.0-0.9 Antichromatin Antibodies        [   <0.2 AI              ]           0.0-0.9 Anti-Jo-1                       [   <0.2 AI              ]           0.0-0.9 Anti-Centromere B Antibodies    [   <0.2 AI              ]           0.0-0.9               [facility]            No: [id number]           [address], [place], [place] [address]           Lindon Romp, MD         [phone]   Result(s) reported on 09 Jul 2012 at 02:48PM.   Anti-Smooth Muscle Antibody ========== TEST NAME ==========  ========= RESULTS =========  = REFERENCE RANGE =  ANTI-SMOOTH MUSCLE ABS  Actin (Smooth Muscle) Antibody Actin (Smooth Muscle) Antibody  [H  26 Units             ]              0-19   Negative                     0 - 19                                 Weak positive               20 - 30                                 Moderate to strong positive     >30                                                                     .              Actin Antibodies are found in 52-85% of patients with                 autoimmune hepatitis or chronic active hepatitis and                 in 22% of patients with primary biliary cirrhosis.               [facility] No: [id number]           [address],  [place], [place] [address]           Lindon Romp, MD         [phone]   Result(s) reported on 09 Jul 2012 at 02:48PM.   Mitochondrial (M2) Antibody ========== TEST NAME ==========  ========= RESULTS =========  = REFERENCE RANGE =  MITOCHONDRIAL (M2) AB  Mitochondrial (M2) Antibody Mitochondrial (M2) Antibody     [   7.6 Units            ]          0.0-20.0              Negative    0.0 - 20.0                                                Equivocal  20.1 - 24.9                                                Positive         >24.9                                                                    .              Mitochondrial (M2) Antibodies are found in 90-96% of                 patients with primary biliary cirrhosis.               [facility]            No: [id number]           [address] [place], [place] [address] Lindon Romp, MD         [phone]   Result(s) reported on 09 Jul 2012 at 02:48PM.   Alpha-1 Antitrypsin Phenotyping ========== TEST NAME ==========  ========= RESULTS =========  = REFERENCE RANGE =  ALPHA1-ANTITRYPSIN PHENO  Alpha-1-Antitrypsin Phenotyp Alpha-1-Antitrypsin, Serum      [   125 mg/dL            ]            90-200 Phenotype (PI)  [   Result Pending       ]                                 [facility]            No: [id number]           [address], [place], [place] [address]           Lindon Romp, MD         [phone]   Result(s) reported on 09 Jul 2012 at 07:48AM.   Hepatitis A Antibody, Total ========== TEST NAME ==========  ========= RESULTS =========  = REFERENCE RANGE =  HEPATITIS A ANTIBDY,TOTL  Hep A  Ab, Total Hep A Ab, Total                 [   Negative             ]          Negative               [facility]   No: [id number]           [address], [place], [place] [address]           Lindon Romp, MD         [phone]   Result(s) reported on 09 Jul 2012 at 12:22PM.   Hepatitis B Surface Antibody, Qual ========== TEST NAME ==========  ========= RESULTS =========  = REFERENCE RANGE =  HEPATITIS B SURF.AB,QUAL  Hep B Surface Ab Hep B Surface Ab, Qual          [   Non Reactive         ]                                                Non Reactive: Inconsistent with immunity,                                            less than 10 mIU/mL                              Reactive:     Consistent with immunity,                                            greater than 9.9 mIU/mL               [facility]            No: [id number]           [address], [place], [place] [address]           Lindon Romp, MD          [phone]   Result(s) reported on 09 Jul 2012 at 12:22PM.   HBsAg ========== TEST NAME ==========  ========= RESULTS =========  = REFERENCE RANGE =  HEPATITIS B SURFACE AG  HBsAg Screen HBsAg Screen                    [   Negative             ]          Negative               [facility]            No: [id number]           [address], [place],  [address]           Lindon Romp, MD         [phone]   Result(s) reported on 09 Jul 2012 at 12:22PM.   Hepatitis C Virus Antibody ========== TEST NAME ==========  ========= RESULTS =========  =  REFERENCE RANGE =  HCV ANTIBODY  HCV Antibody Hep C Virus Ab                  [   0.1 s/co ratio       ]           0.0-0.9                                                  Negative:     < 0.8                                              Indeterminate 0.8 - 0.9                                                  Positive:     > 0.9                                                                      .                  In order to reduce the incidence of a false positive                  result, the CDC recommends that all s/co ratios                  between 1.0 and 10.9 be confirmed by a more specific                  supplemental or PCR testing. [facility] offers HCV Ab                 w/Reflex to Verification test [phone].               [facility]            No: [id number]           [address], [place], [place] [address]           Lindon Romp, MD         [phone]   Result(s) reportedon [phone] Dec 2013 at 12:22PM.  Routine Chem:  03-Dec-13 04:33    Glucose, Serum 92   BUN  2   Creatinine (comp)  0.45   Sodium, Serum  135   Potassium, Serum  3.0   Chloride, Serum 104   CO2, Serum 23   Calcium (Total), Serum  7.1   Anion Gap 8   Osmolality (calc) 266   eGFR (African American) >60   eGFR (Non-African American) >60 (eGFR values <22m/min/1.73 m2 may be an indication of chronic kidney  disease (CKD). Calculated eGFR is useful in patients with stable renal function. The eGFR calculation will not be reliable in acutely ill patients when serum creatinine is  changing rapidly. It is not useful in  patients on dialysis. The eGFR calculation may not be applicable to patients at the low and high extremes of body sizes, pregnant women, and vegetarians.)  Routine Coag:  03-Dec-13 04:33    Prothrombin  20.9   INR 1.8 (INR reference interval applies to patients on anticoagulant therapy. A single INR therapeutic range for coumarins is not optimal for all indications; however, the suggested range for most indications is 2.0 - 3.0. Exceptions to the INR Reference Range may include: Prosthetic heart valves, acute myocardial infarction, prevention of myocardial infarction, and combinations of aspirin and anticoagulant. The need for a higher or lower target INR must be assessed individually. Reference: The Pharmacology and Management of the Vitamin K  antagonists: the seventh ACCP Conference on Antithrombotic and Thrombolytic Therapy. TFTDD.2202 Sept:126 (3suppl): N9146842. A HCT value >55% may artifactually increase the PT.  In one study,  the increase was an average of 25%. Reference:  "Effect on Routine and Special Coagulation Testing Values of Citrate Anticoagulant Adjustment in Patients with High HCT Values." American Journal of Clinical Pathology 2006;126:400-405.)  Routine Hem:  03-Dec-13 04:33    WBC (CBC) 8.8   RBC (CBC)  2.51   Hemoglobin (CBC)  9.1   Hematocrit (CBC)  27.5   Platelet Count (CBC)  101   MCV  110   MCH  36.5   MCHC 33.2   RDW 13.8   Neutrophil % 64.1   Lymphocyte % 25.3   Monocyte % 7.9   Eosinophil % 2.0   Basophil % 0.7   Neutrophil # 5.7   Lymphocyte # 2.2   Monocyte # 0.7   Eosinophil # 0.2   Basophil # 0.1 (Result(s) reported on 09 Jul 2012 at 05:24AM.)   Assessment/Plan:  Assessment/Plan:   Assessment 1) childs pugh classb-c  cirrhosis.  etiology likely due to etoh.  lab testing negative so far, slight elevation of asma probably secondary to cirrhosis/liver inflammation.   2) etoh related hepatopathy.    Plan 1) continue current.  may be able to increase aldactone in a week or 2.  minimal peripheral edema.  counselled etoh abstinance.   Electronic Signatures: Loistine Simas (MD)  (Signed 03-Dec-13 17:01)  Authored: Chief Complaint, VITAL SIGNS/ANCILLARY NOTES, Brief Assessment, Lab Results, Assessment/Plan   Last Updated: 03-Dec-13 17:01 by Loistine Simas (MD)

## 2014-11-24 NOTE — Consult Note (Signed)
Chief Complaint:   Subjective/Chief Complaint Patient seen and exaomine, chart reviewed.  Please see full Gi consult.   Patient admitted with new onset ascites in the setting of daily long term etoh use.  Multiple labs drawn to further assess etiology.  Abdomen more comfortable after paracentesis earlier today.  Ascitic fluid c/w portal htn.    Labs c/w etoh use.  No evidence of portal vein thrombosis on repeat ct.  Counseled cessation of etoh. Following.   VITAL SIGNS/ANCILLARY NOTES: **Vital Signs.:   02-Dec-13 17:15   Vital Signs Type Q 4hr   Temperature Temperature (F) 98.1   Celsius 36.7   Temperature Source Oral   Pulse Pulse 95   Respirations Respirations 18   Systolic BP Systolic BP 95   Diastolic BP (mmHg) Diastolic BP (mmHg) 66   Mean BP 75   Pulse Ox % Pulse Ox % 97   Pulse Ox Activity Level  At rest   Oxygen Delivery Room Air/ 21 %   Electronic Signatures: Barnetta ChapelSkulskie, Amarisa Wilinski (MD)  (Signed 02-Dec-13 19:29)  Authored: Chief Complaint, VITAL SIGNS/ANCILLARY NOTES   Last Updated: 02-Dec-13 19:29 by Barnetta ChapelSkulskie, Kiley Solimine (MD)

## 2014-11-24 NOTE — Consult Note (Signed)
PATIENT NAME:  Susan [name], Susan [name] MR#:  [id number] DATE OF BIRTH:  Aug 22, 1970  DATE OF CONSULTATION:  08/02/2012  REFERRING PHYSICIAN:  Bluford MainSheikh Tejan-Sie, MD CONSULTING PHYSICIAN:  Christena DeemMartin U. Jannine Abreu, MD  REASON FOR CONSULTATION: Cirrhosis of the liver.   HISTORY OF PRESENT ILLNESS: Ms. Susan [name] is a 44 year old Caucasian female with whom I am familiar. I saw her initially in consultation at [facility] in regards to ascites and abnormal liver enzymes approximately 3 to 4 weeks ago. Subsequent to that hospitalization, I saw her in consultation follow-up in the office last week. At that time, she presented with 2+ lower extremity edema as well as ascites. I had increased her diuretics, both Aldactone and Lasix, at that time. However, the patient did not fill those until the day before coming to the hospital. She called with complaints of abdominal pain and was sent to the ER for further evaluation. Currently she is feeling much better. She denies any nausea or vomiting. There is some lateral muscle wall discomfort in the abdomen. Her abdomen is actually less tense than it was last week. Her lower extremity edema is less than it was last week. She states that the abdominal pain is a 7 out of 10. Her last bowel movement was this morning that was soft. She has seen no black stools, blood in the stools or slimy stools. There are no problems with dysphagia or heartburn. It is of note that the patient denies the use of any alcohol since her last hospitalization.   PAST MEDICAL HISTORY: 1. Depression.  2. Anxiety.  3. Hyperlipidemia.  4. Uterine ablation.  5. Asthma.  6. History of fractured pelvis.  7. C-section x 1.  8. Myringotomy.  9. Congenital heart murmur.  10. History of portal hypertension. 11. Imaging studies consistent with cirrhosis of the liver. Evaluation on her last hospitalization was negative for other etiologies with the exception of chronic alcohol abuse.     OUTPATIENT  MEDICATIONS: 1. Acetaminophen/hydrocodone 325/5 mg one every 6 hours p.r.n. for pain. 2. Cipro 500 mg once a week. 3. Escitalopram 20 mg daily. 4. Lasix. She was possibly taking 20 mg a day however should have been taking 40 mg.  5. Potassium chloride 20 mEq daily. 6. Prilosec 40 mg a day. 7. Spironolactone. She was possibly taking 25 mg and should have been taking 50 mg.  8. Trilipix 45 mg once a day.  9. Xanax 0.5 mg twice a day.   ALLERGIES: She has no known drug allergies.   PHYSICAL EXAMINATION:  VITAL SIGNS: Temperature is 98.8, pulse 93, respirations 20, blood pressure 97/63 and pulse oximetry 98%.   GENERAL: She is a 44 year old Caucasian female in no acute distress. She seems to be more mentally alert than she has been at any other occasion that I have seen her.   HEAD/FACE: Normocephalic, atraumatic.   EYES: Anicteric.   NOSE: Septum midline. No lesions.   OROPHARYNX: Fair dentition.   NECK: Supple. No JVD.   HEART: Regular rate and rhythm.   LUNGS: Clear.   ABDOMEN: Soft. She does have ascites. There is a mild fluid wave, however, it is not near as tense as she has been in the past. Bowel sounds positive, normoactive. There is some minimal discomfort to palpation, particularly on the flanks. There are no masses or rebound.   EXTREMITIES: 1+ lower extremity edema.   NEUROLOGICAL: Cranial nerves II through XII grossly intact. It is of note that she shows pitting edema on the lower torso  posteriorly and down the backs of her legs more so.   NEUROLOGICAL: Cranial nerves II through XII grossly intact. Muscle strength bilaterally equal and symmetric, 5 out 5. DTRs bilaterally equal and symmetric. There is no asterixis.  LABS:  Last night on admission to the hospital she had a glucose of 95, BUN 4, creatinine 0.57, sodium 138, potassium 4.6, chloride 105, bicarbonate 27, calcium 7.8 and lipase 103. There was slight hemolysis noted on her sample. She had a hemogram with a  white count of 6.7, hemoglobin and hematocrit 11.2/32.8, platelet count of 205, MCV is 101 and RDW 14.6.   Urinalysis showed trace leukocyte esterase, 20 red cells per high-power field and 9 white cells per high-power field.   There were no repeat labs done this morning, no imaging studies.   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 She has had extensive laboratory evaluation, on her last evaluation as well as outpatient. Alpha-fetoprotein has been normal and she has been negative for other etiologies such as chronic hepatitis or autoimmune diseases. She claims abstinence from alcohol over the past 3 to 4 weeks. We discussed extensively the need to continue abstinence from alcohol and also the need to be compliant with medications as prescribed. The patient is currently doing better than on admission and certainly even seems to be doing somewhat better than a week ago when I saw her in the office. Would continue current medications. Will need to drop the Lasix a bit to a p.o. dosing prior to discharge. We will need to do daily weights and strict I and O for the next day or so. She will need close followup in the GI outpatient clinic and encouragement to continue alcohol abstinence and medication compliance. At this point, I would not pursue           paracentesis as her ascitic volume has decreased markedly. She does not have tense ascites. Previous paracentesis was consistent with portal hypertension.  We will follow with you. Thank you for this consult and assistance. ____________________________ Christena Deem, MD mus:sb D: 08/02/2012 11:13:20 ET T: 08/02/2012 11:52:54 ET JOB#: [id number]  cc: Christena Deem, MD, <Dictator> Christena Deem MD ELECTRONICALLY SIGNED 08/04/2012 17:28

## 2014-11-24 NOTE — Consult Note (Signed)
PATIENT NAME:  Susan [name], Susan [name] MR#:  [id number] DATE OF BIRTH:  01/10/1971  DATE OF CONSULTATION:  07/08/2012  REFERRING PHYSICIAN:  Dr. Ellsworth Lennoxejan-Sie CONSULTING PHYSICIAN:  Keturah Barrehristiane H. Selwyn Reason, NP  HISTORY OF PRESENT ILLNESS: I appreciate consult from Dr. Ellsworth Lennoxejan-Sie regarding a 44 year old Caucasian woman with a history of chronic daily alcohol use of at least 2 to 3 mixed drinks a day for multiple years for evaluation of cirrhosis and abdominal pain. The patient reports this is the first time she has ever been jaundiced and had ascites and lower extremity swelling and has no known history of liver disease. 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 She denies illicits, foreign travel, blood transfusions, military service, history of viral hepatitis, and family history of liver disease. She also does have some elevation of liver enzymes with AST greater than ALT but less than 300, bilirubin elevated, platelet count normal, some elevation to the PT. She has undergone paracentesis, ultrasound, and CT since admission. Paracentesis is suggestive of portal hypertension. Spironolactone has been started.  She states she is feeling some better since her paracentesis. Her breathing has improved. She has less abdominal pain and states it is now just mostly in the right upper quadrant and is not nearly as severe. She does report intermittent mild nausea and had vomited some at home, but this is better as well. She takes TUMS sometimes for acid reflux symptoms. No trouble swallowing. States she has a bowel movement most every other day. Denies black, tarry, bloody stools. Also reports fatigue and intermittent weakness. She did undergo EGD in 2011 with no varices, had a colonoscopy in 2011 with a couple of polyps.   PAST MEDICAL HISTORY:  1. Depression.  2. Anxiety.  3. Hyperlipidemia.  4. Asthma.  5. Uterine ablation.  6. Fractured pelvis.   7. C-section. 8. Tubes in ears.  9. Congenital heart murmur.   MEDICATIONS:  1. Clonazepam 1 mg p.o. b.i.d.  2. Lexapro 10 mg p.o. daily.   ALLERGIES: Over-the-counter flu medicine. She cannot recall the name of the medication.   SOCIAL HISTORY: 15 pack-year cigarette smoking history, 2 to 3 mixed alcohol drinks most every day. Denies illicits. Currently unemployed.   FAMILY HISTORY: Mother with lung cancer, metastasized to the liver, pancreas, and brain. Significant also for breast cancer, skin cancer, stroke or heart attack. No known history of liver disease. No known history of colorectal cancer.   REVIEW OF SYSTEMS: Ten-point review negative other than what is noted above with the exception of fluctuating weight, intermittent hot and cold flashes. Reports some blurry vision at times.  MOST RECENT LAB WORK: Glucose 91, serum iron 93, bilirubin 3, creatinine 0.43, sodium 136, potassium 2.8, folic acid 3.9, chloride 101, GFR greater than 60, TIBC 162, iron saturation 57%. Calcium 7, magnesium 2.1, ferritin 1097, total serum protein 6.3, albumin 1.4, total bilirubin 2.8, alkaline phosphatase 152, AST 184, ALT 48, PT 15.9, INR 1.2.   Paracentesis with white-yellow clear fluid, 66 nucleated cells, 2 neutrophils, 37 lymphocytes, 61 macrophage, no eosinophils, basophils, or other cells. Albumin 0.3, glucose 139, protein 0.8. No growth to blood cultures so far. No growth to paracentesis fluid culture so far. Did note 0.347 EtOH content on admission. B12 greater than 1999. CT  12/01 with a shrunken, dense heterogenous liver. No discrete mass or ductal dilation, large amount of ascites. Portal vein opacifies well. Splenic vein appears normal. Spleen is not enlarged.  No evidence of small or large bowel obstruction or other intraabdominal/pelvic mass or lymphadenopathy noted. Ultrasound on 11/30 was with ascites, normal gallbladder, increased echotexture of the liver.   PHYSICAL EXAMINATION:  VITAL  SIGNS: Most recent vital signs: Temperature 98.1, pulse 95, respiratory rate 18, blood pressure 95/66, oxygen saturation 97% on room air. She has been in normal sinus rhythm per monitor.   GENERAL: Pleasant Caucasian woman sitting in bed in no acute distress.   HEENT: Normocephalic, atraumatic. Sclerae with mild icterus bilaterally. No redness, drainage, or erythema to the nares, mouth, or eyes.    NECK: No abnormalities. No lymphadenopathy, JVD, or thyromegaly.   CHEST: Respirations eupneic. Lungs with faint crackles to the bottom on inspiration, otherwise without abnormality.   CARDIAC: S1, S2. Regular rate and rhythm. Grade 2/6 murmur to the pulmonic area. No gallop. 1+ edema to bilateral lower extremities. Peripheral pulses 2+.   ABDOMEN: Protuberant, consistent with ascites, soft. Bowel sounds times four. Mild tenderness over the right upper quadrant. No hepatosplenomegaly, masses, rebound tenderness, peritoneal signs, guarding, or rigidity appreciated.   RECTAL: Deferred.   GU: Deferred.   SKIN: Warm, dry, and pink. A few scattered bruises. No erythema, lesion, or rash.   EXTREMITIES: Moves all extremities well times four. Gait steady. No clubbing or cyanosis.   PSYCHIATRIC: Cooperative. Limited insight into her alcohol behaviors, pleasant.   NEURO: Alert, oriented times three. Speech clear. No facial droop. Cranial nerves II through XII grossly intact. Mild asterixis.   IMPRESSION AND PLAN:  1. Cirrhosis: Likely alcoholic, however, we will assess for other etiology as well due to elevated bilirubin and ALT.  I did strongly recommend sobriety. She states she has help with this at home. May use AA as adjunct. Of note, in chart review the patient is with at least two prior admissions to psych regarding alcohol and substance abuse. We will also start SBP prophylaxis, daily weights, strict Is and Os, daily liver panel, and PT-INR. We will also add liver serologies to include viral  hepatitis, autoimmune and inherited liver disorders, and AFP. 2. Ascites: Agree with spironolactone therapy and 2-gram sodium diet. Would maximize  diuretic therapy and consider adding Lasix if the patient tolerates. She should have her electrolytes corrected. Start with low-dose. She may need paracentesis in the future to help alleviate the ascites.   Thank you for this consult. We will follow with you. These services were provided by Vevelyn Pat, MSN, [facility] in collaboration with Barnetta Chapel, M.D. with whom I have discussed this patient in full.     ____________________________ Keturah Barre, NP chl:bjt D: 07/08/2012 18:01:00 ET T: 07/09/2012 06:08:08 ET JOB#: [id number]  cc: Keturah Barre, NP, <Dictator> Eustaquio Maize Beaumont Austad FNP ELECTRONICALLY SIGNED 07/09/2012 7:26

## 2014-11-27 NOTE — Discharge Summary (Signed)
PATIENT NAME:  Susan [name], Susan [name] MR#:  [id number] DATE OF BIRTH:  [birth year]  DATE OF ADMISSION:  08/02/2012 DATE OF DISCHARGE:  08/06/2012  PRIMARY CARE PHYSICIAN:  Yves DillNeelam Khan, MD  DISCHARGE DIAGNOSES: 1.  Alcoholic liver cirrhosis.  2.  Ascites.  3.  Electrolyte abnormalities.   DISCHARGE MEDICATIONS: Spironolactone 100 mg daily. Trilipix 45 mg 1 daily. Prilosec 40 mg daily.  Potassium chloride 20 mEq twice a day. Cipro 500 mg once a week on Saturdays. Escitalopram 20 mg daily.  Furosemide 40 mg daily.  Xanax 0.5 mg b.i.d.  Vicodin 5/225 p.r.n. q.6 hours.  HOSPITAL COURSE: The patient was admitted through the Emergency Room where she presented with worsening abdominal distention, abdominal pain and perceived increased peripheral edema. Please refer to the history and physical for full details. The patient'[name] evaluation revealed deterioration in her ascites and peripheral edema. She was admitted for diuresis. Gastroenterology was consulted and their evaluation was that her ascites did not require paracentesis and could be managed by increased diuresis. Her spironolactone was increased to 100 mg daily. She was briefly placed on intravenous Lasix with significant weight loss and improvement in her symptoms. Subsequent to her brisk diuresis, the patient did experience hypokalemia, hypomagnesemia that were corrected with potassium and magnesium supplementation, respectively. The patient states that she has abstained from alcohol for the last several weeks and reports that this will remain permanent. She was once again advised on the  benefits of avoiding alcohol due to stage B liver cirrhosis.   CONDITION ON DISCHARGE:  The patient was discharged to home in satisfactory condition with home health services.   DIET: Low sodium.   ACTIVITY: As tolerated.   FOLLOWUP: Follow up with Dr. Ellsworth Lennoxejan-Sie in 1 to 2 weeks, with Dr. Marva PandaSkulskie 1 to 2 weeks.   Discharge process time spent: 37 minutes.      ____________________________ Silas FloodSheikh A. Ellsworth Lennoxejan-Sie, MD sat:ct D: 08/17/2012 12:01:20 ET T: 08/18/2012 11:30:29 ET JOB#: [id number]  cc: Sheikh A. Ellsworth Lennoxejan-Sie, MD, <Dictator> Charlesetta GaribaldiSHEIKH A TEJAN-SIE MD ELECTRONICALLY SIGNED 08/21/2012 9:24

## 2014-12-02 ENCOUNTER — Other Ambulatory Visit: Payer: Self-pay | Admitting: Gastroenterology

## 2014-12-02 DIAGNOSIS — K7031 Alcoholic cirrhosis of liver with ascites: Secondary | ICD-10-CM

## 2014-12-07 ENCOUNTER — Ambulatory Visit: Payer: Medicaid Other

## 2014-12-09 ENCOUNTER — Encounter: Payer: Self-pay | Admitting: Emergency Medicine

## 2014-12-09 ENCOUNTER — Observation Stay
Admission: EM | Admit: 2014-12-09 | Discharge: 2014-12-12 | Disposition: A | Discharge status: Home / Self Care | Payer: Medicaid Other | Attending: Internal Medicine | Admitting: Internal Medicine

## 2014-12-09 ENCOUNTER — Emergency Department: Payer: Medicaid Other

## 2014-12-09 DIAGNOSIS — F1010 Alcohol abuse, uncomplicated: Secondary | ICD-10-CM | POA: Diagnosis not present

## 2014-12-09 DIAGNOSIS — I10 Essential (primary) hypertension: Secondary | ICD-10-CM | POA: Diagnosis not present

## 2014-12-09 DIAGNOSIS — R1084 Generalized abdominal pain: Secondary | ICD-10-CM | POA: Diagnosis not present

## 2014-12-09 DIAGNOSIS — K649 Unspecified hemorrhoids: Secondary | ICD-10-CM | POA: Insufficient documentation

## 2014-12-09 DIAGNOSIS — K709 Alcoholic liver disease, unspecified: Principal | ICD-10-CM | POA: Insufficient documentation

## 2014-12-09 DIAGNOSIS — Z888 Allergy status to other drugs, medicaments and biological substances status: Secondary | ICD-10-CM | POA: Diagnosis not present

## 2014-12-09 DIAGNOSIS — R1013 Epigastric pain: Secondary | ICD-10-CM | POA: Insufficient documentation

## 2014-12-09 DIAGNOSIS — R197 Diarrhea, unspecified: Secondary | ICD-10-CM | POA: Diagnosis not present

## 2014-12-09 DIAGNOSIS — K625 Hemorrhage of anus and rectum: Secondary | ICD-10-CM | POA: Insufficient documentation

## 2014-12-09 DIAGNOSIS — K219 Gastro-esophageal reflux disease without esophagitis: Secondary | ICD-10-CM | POA: Insufficient documentation

## 2014-12-09 DIAGNOSIS — F419 Anxiety disorder, unspecified: Secondary | ICD-10-CM | POA: Insufficient documentation

## 2014-12-09 DIAGNOSIS — K766 Portal hypertension: Secondary | ICD-10-CM | POA: Diagnosis not present

## 2014-12-09 DIAGNOSIS — J45909 Unspecified asthma, uncomplicated: Secondary | ICD-10-CM | POA: Diagnosis not present

## 2014-12-09 DIAGNOSIS — Z86010 Personal history of colonic polyps: Secondary | ICD-10-CM | POA: Insufficient documentation

## 2014-12-09 DIAGNOSIS — R1033 Periumbilical pain: Secondary | ICD-10-CM | POA: Insufficient documentation

## 2014-12-09 DIAGNOSIS — F17210 Nicotine dependence, cigarettes, uncomplicated: Secondary | ICD-10-CM | POA: Insufficient documentation

## 2014-12-09 DIAGNOSIS — K7030 Alcoholic cirrhosis of liver without ascites: Secondary | ICD-10-CM | POA: Insufficient documentation

## 2014-12-09 DIAGNOSIS — Z91040 Latex allergy status: Secondary | ICD-10-CM | POA: Diagnosis not present

## 2014-12-09 DIAGNOSIS — L299 Pruritus, unspecified: Secondary | ICD-10-CM | POA: Diagnosis not present

## 2014-12-09 DIAGNOSIS — K859 Acute pancreatitis, unspecified: Secondary | ICD-10-CM | POA: Diagnosis present

## 2014-12-09 DIAGNOSIS — E785 Hyperlipidemia, unspecified: Secondary | ICD-10-CM | POA: Diagnosis not present

## 2014-12-09 DIAGNOSIS — Z881 Allergy status to other antibiotic agents status: Secondary | ICD-10-CM | POA: Diagnosis not present

## 2014-12-09 DIAGNOSIS — J439 Emphysema, unspecified: Secondary | ICD-10-CM | POA: Insufficient documentation

## 2014-12-09 DIAGNOSIS — K921 Melena: Secondary | ICD-10-CM | POA: Diagnosis present

## 2014-12-09 DIAGNOSIS — F329 Major depressive disorder, single episode, unspecified: Secondary | ICD-10-CM | POA: Insufficient documentation

## 2014-12-09 DIAGNOSIS — R109 Unspecified abdominal pain: Secondary | ICD-10-CM | POA: Diagnosis present

## 2014-12-09 DIAGNOSIS — K7460 Unspecified cirrhosis of liver: Secondary | ICD-10-CM

## 2014-12-09 DIAGNOSIS — J449 Chronic obstructive pulmonary disease, unspecified: Secondary | ICD-10-CM

## 2014-12-09 HISTORY — DX: Major depressive disorder, single episode, unspecified: F32.9

## 2014-12-09 HISTORY — DX: Cardiac murmur, unspecified: R01.1

## 2014-12-09 HISTORY — DX: Allergy, unspecified, initial encounter: T78.40XA

## 2014-12-09 HISTORY — DX: Emphysema, unspecified: J43.9

## 2014-12-09 HISTORY — DX: Chronic obstructive pulmonary disease, unspecified: J44.9

## 2014-12-09 HISTORY — DX: Anemia, unspecified: D64.9

## 2014-12-09 HISTORY — DX: Gastro-esophageal reflux disease without esophagitis: K21.9

## 2014-12-09 HISTORY — DX: Unspecified asthma, uncomplicated: J45.909

## 2014-12-09 HISTORY — DX: Depression, unspecified: F32.A

## 2014-12-09 HISTORY — DX: Essential (primary) hypertension: I10

## 2014-12-09 HISTORY — DX: Unspecified cirrhosis of liver: K74.60

## 2014-12-09 HISTORY — DX: Anxiety disorder, unspecified: F41.9

## 2014-12-09 HISTORY — DX: Hyperlipidemia, unspecified: E78.5

## 2014-12-09 LAB — CBC WITH DIFFERENTIAL/PLATELET
BASOS PCT: 0 %
Basophils Absolute: 0 10*3/uL (ref 0–0.1)
EOS ABS: 0.2 10*3/uL (ref 0–0.7)
EOS PCT: 2 %
HCT: 45.9 % (ref 35.0–47.0)
Hemoglobin: 15.8 g/dL (ref 12.0–16.0)
Lymphocytes Relative: 39 %
Lymphs Abs: 3.7 10*3/uL — ABNORMAL HIGH (ref 1.0–3.6)
MCH: 33.2 pg (ref 26.0–34.0)
MCHC: 34.5 g/dL (ref 32.0–36.0)
MCV: 96.2 fL (ref 80.0–100.0)
Monocytes Absolute: 0.7 10*3/uL (ref 0.2–0.9)
Monocytes Relative: 7 %
Neutro Abs: 5 10*3/uL (ref 1.4–6.5)
Neutrophils Relative %: 52 %
PLATELETS: 207 10*3/uL (ref 150–440)
RBC: 4.77 MIL/uL (ref 3.80–5.20)
RDW: 13.7 % (ref 11.5–14.5)
WBC: 9.7 10*3/uL (ref 3.6–11.0)

## 2014-12-09 LAB — URINALYSIS COMPLETE WITH MICROSCOPIC (ARMC ONLY)
Bacteria, UA: NONE SEEN
Bilirubin Urine: NEGATIVE
Glucose, UA: NEGATIVE mg/dL
Hgb urine dipstick: NEGATIVE
Ketones, ur: NEGATIVE mg/dL
LEUKOCYTES UA: NEGATIVE
Nitrite: NEGATIVE
Protein, ur: NEGATIVE mg/dL
RBC / HPF: NONE SEEN RBC/hpf (ref 0–5)
Specific Gravity, Urine: 1.002 — ABNORMAL LOW (ref 1.005–1.030)
Squamous Epithelial / LPF: NONE SEEN
WBC, UA: NONE SEEN WBC/hpf (ref 0–5)
pH: 6 (ref 5.0–8.0)

## 2014-12-09 LAB — COMPREHENSIVE METABOLIC PANEL
ALBUMIN: 4.2 g/dL (ref 3.5–5.0)
ALT: 35 U/L (ref 14–54)
AST: 45 U/L — ABNORMAL HIGH (ref 15–41)
Alkaline Phosphatase: 80 U/L (ref 38–126)
Anion gap: 11 (ref 5–15)
BUN: 7 mg/dL (ref 6–20)
CHLORIDE: 98 mmol/L — AB (ref 101–111)
CO2: 23 mmol/L (ref 22–32)
CREATININE: 0.55 mg/dL (ref 0.44–1.00)
Calcium: 8.7 mg/dL — ABNORMAL LOW (ref 8.9–10.3)
GFR calc non Af Amer: >60 mL/min (ref >60)
GLUCOSE: 101 mg/dL — AB (ref 65–99)
Potassium: 4 mmol/L (ref 3.5–5.1)
Sodium: 132 mmol/L — ABNORMAL LOW (ref 135–145)
Total Bilirubin: 0.9 mg/dL (ref 0.3–1.2)
Total Protein: 7.9 g/dL (ref 6.5–8.1)

## 2014-12-09 LAB — LIPASE, BLOOD: Lipase: 118 U/L — ABNORMAL HIGH (ref 22–51)

## 2014-12-09 LAB — HEMOGLOBIN: Hemoglobin: 14.8 g/dL (ref 12.0–16.0)

## 2014-12-09 MED ORDER — MORPHINE SULFATE 4 MG/ML IJ SOLN
INTRAMUSCULAR | Status: AC
  Administered 2014-12-09: 4 mg via INTRAVENOUS
  Filled 2014-12-09: qty 1

## 2014-12-09 MED ORDER — MORPHINE SULFATE 4 MG/ML IJ SOLN
4.0000 mg | Freq: Once | INTRAMUSCULAR | Status: AC
  Administered 2014-12-09: 4 mg via INTRAVENOUS

## 2014-12-09 MED ORDER — NICOTINE 10 MG IN INHA
1.0000 | RESPIRATORY_TRACT | Status: DC | PRN
  Administered 2014-12-09: 20:00:00 1 via RESPIRATORY_TRACT
  Filled 2014-12-09 (×2): qty 33

## 2014-12-09 MED ORDER — CLONAZEPAM 1 MG PO TABS
1.0000 mg | ORAL_TABLET | Freq: Two times a day (BID) | ORAL | Status: DC | PRN
  Administered 2014-12-09 – 2014-12-11 (×5): 1 mg via ORAL
  Filled 2014-12-09 (×5): qty 1

## 2014-12-09 MED ORDER — ALBUTEROL SULFATE (2.5 MG/3ML) 0.083% IN NEBU: 2.5000 mg | INHALATION_SOLUTION | RESPIRATORY_TRACT | Status: DC | PRN

## 2014-12-09 MED ORDER — LORATADINE 10 MG PO TABS
10.0000 mg | ORAL_TABLET | Freq: Every day | ORAL | Status: DC
  Administered 2014-12-10 – 2014-12-12 (×3): 10 mg via ORAL
  Filled 2014-12-09 (×3): qty 1

## 2014-12-09 MED ORDER — ESCITALOPRAM OXALATE 10 MG PO TABS
20.0000 mg | ORAL_TABLET | Freq: Every day | ORAL | Status: DC
  Administered 2014-12-09 – 2014-12-12 (×4): 20 mg via ORAL
  Filled 2014-12-09 (×4): qty 2

## 2014-12-09 MED ORDER — OXYCODONE HCL 5 MG PO TABS
10.0000 mg | ORAL_TABLET | Freq: Three times a day (TID) | ORAL | Status: AC | PRN
  Administered 2014-12-09 – 2014-12-11 (×6): 10 mg via ORAL
  Filled 2014-12-09 (×3): qty 2
  Filled 2014-12-09: qty 1
  Filled 2014-12-09 (×7): qty 2
  Filled 2014-12-09: qty 1
  Filled 2014-12-09: qty 2

## 2014-12-09 MED ORDER — SPIRONOLACTONE 25 MG PO TABS
25.0000 mg | ORAL_TABLET | Freq: Every day | ORAL | Status: DC
  Administered 2014-12-10 – 2014-12-12 (×3): 25 mg via ORAL
  Filled 2014-12-09 (×3): qty 1

## 2014-12-09 MED ORDER — PANTOPRAZOLE SODIUM 40 MG PO TBEC
40.0000 mg | DELAYED_RELEASE_TABLET | Freq: Every day | ORAL | Status: DC
  Administered 2014-12-09 – 2014-12-12 (×4): 40 mg via ORAL
  Filled 2014-12-09 (×4): qty 1

## 2014-12-09 MED ORDER — IOHEXOL 240 MG/ML SOLN
25.0000 mL | Freq: Once | INTRAMUSCULAR | Status: AC | PRN
  Administered 2014-12-09: 25 mL via ORAL

## 2014-12-09 MED ORDER — PROMETHAZINE HCL 25 MG/ML IJ SOLN
INTRAMUSCULAR | Status: AC
  Administered 2014-12-09: 25 mg via INTRAVENOUS
  Filled 2014-12-09: qty 1

## 2014-12-09 MED ORDER — IOHEXOL 350 MG/ML SOLN
100.0000 mL | Freq: Once | INTRAVENOUS | Status: AC | PRN
  Administered 2014-12-09: 100 mL via INTRAVENOUS

## 2014-12-09 MED ORDER — ALBUTEROL SULFATE HFA 108 (90 BASE) MCG/ACT IN AERS: 1.0000 | INHALATION_SPRAY | RESPIRATORY_TRACT | Status: DC | PRN

## 2014-12-09 MED ORDER — BUDESONIDE-FORMOTEROL FUMARATE 80-4.5 MCG/ACT IN AERO
2.0000 | INHALATION_SPRAY | Freq: Two times a day (BID) | RESPIRATORY_TRACT | Status: DC
  Administered 2014-12-09 – 2014-12-12 (×6): 2 via RESPIRATORY_TRACT
  Filled 2014-12-09: qty 6.9

## 2014-12-09 MED ORDER — PROMETHAZINE HCL 25 MG/ML IJ SOLN
25.0000 mg | Freq: Once | INTRAMUSCULAR | Status: AC
  Administered 2014-12-09: 25 mg via INTRAVENOUS

## 2014-12-09 NOTE — ED Provider Notes (Signed)
[facility] Emergency Department Provider Note    ____________________________________________  Time seen: 9:30 AM  I have reviewed the triage vital signs and the nursing notes.   HISTORY  Chief Complaint Diarrhea       HPI Susan [name] is a 44 y.o. female who complains of abdominal pain and bloody diarrhea for about 2 weeks. Symptoms have been worsening over that time. She has not seen her primary care doctor for this. Does report a history of external hemorrhoids although they are not getting her problem at this time. Pain in abdomen is diffuse. Does not report any fevers. She is reported bright red blood with loose bowel movements. She does have a history of cirrhosis and bruises easily.     Past Medical History  Diagnosis Date  . Hypertension   . Cirrhosis   . Hyperlipidemia     There are no active problems to display for this patient.   History reviewed. No pertinent past surgical history.  No current outpatient prescriptions on file.  Allergies Doxycycline and Latex  History reviewed. No pertinent family history.  Social History History  Substance Use Topics  . Smoking status: Current Every Day Smoker -- 0.50 packs/day    Types: Cigarettes  . Smokeless tobacco: Not on file  . Alcohol Use: Yes    Review of Systems  Constitutional: Negative for fever. Eyes: Negative for visual changes. ENT: Negative for sore throat. Cardiovascular: Negative for chest pain. Respiratory: Negative for shortness of breath. Gastrointestinal: Referred history of present illness Genitourinary: Negative for dysuria. Musculoskeletal: Negative for pain. Skin: Negative for rash. Neurological: Negative for headaches, focal weakness or numbness.   10-point ROS otherwise negative.  ____________________________________________   PHYSICAL EXAM:  VITAL SIGNS: ED Triage Vitals  Enc Vitals Group     BP 12/09/14 0924 126/80 mmHg     Pulse Rate  12/09/14 0924 106     Resp 12/09/14 0924 17     Temp 12/09/14 0924 98.4 F (36.9 C)     Temp Source 12/09/14 0924 Oral     SpO2 12/09/14 0924 93 %     Weight 12/09/14 0924 160 lb (72.576 kg)     Height 12/09/14 0924 5\' 2"  (1.575 m)     Head Cir --      Peak Flow --      Pain Score 12/09/14 0929 8     Pain Loc --      Pain Edu? --      Excl. in GC? --      Constitutional: Alert and oriented. Well appearing and in no distress. Eyes: Conjunctivae are normal. PERRL. Normal extraocular movements. ENT   Head: Normocephalic and atraumatic.   Nose: No congestion/rhinnorhea.   Mouth/Throat: Mucous membranes are moist.   Neck: No stridor. Hematological/Lymphatic/Immunilogical: No obvious bruising Cardiovascular: Normal rate, regular rhythm. Normal and symmetric distal pulses are present in all extremities. No murmurs, rubs, or gallops. Respiratory: Normal respiratory effort without tachypnea nor retractions. Breath sounds are clear and equal bilaterally. No wheezes/rales/rhonchi. Gastrointestinal: Soft and diffusely tender moderate. No distention. No abdominal bruits. Mildly tender rectal exam 1 non-irritated external hemorrhoid. Secretions tested heme positive mild Genitourinary: Deferred Musculoskeletal: Nontender with normal range of motion in all extremities. No joint effusions.  No lower extremity tenderness nor edema. Neurologic:  Normal speech and language. No gross focal neurologic deficits are appreciated. Speech is normal. No gait instability. Skin:  Skin is warm, dry and intact. No rash noted. Psychiatric: Mood and  affect are normal. Speech and behavior are normal. Patient exhibits appropriate insight and judgment.  ____________________________________________    LABS (pertinent positives/negatives)  Lipase is 118 Sodium 132 Urinalysis negative WBC and hemoglobin are unremarkable  _________________________ Normal sinus rhythm 93 bpm narrow QRS normal axis  normal ST and T-wave  ____________________________________________    RADIOLOGY  Reviewed radiology results the CT scan shows chronic cirrhosis with no inflammatory or acute process found  ____________________________________________   PROCEDURES  Procedure(s) performed: None  Critical Care performed: No  ____________________________________________   INITIAL IMPRESSION / ASSESSMENT AND PLAN / ED COURSE  Pertinent labs & imaging results that were available during my care of the patient were reviewed by me and considered in my medical decision making (see chart for details).  Patient's condition appears to be due to pancreatitis. Patient is alert and oxycodone at home and states she is having continued pain I'll give her additional IV pain medication and she'll need to come into the hospital for observation and management. I discussed with the hospitalist for admission.  ____________________________________________   FINAL CLINICAL IMPRESSION(S) / ED DIAGNOSES  Final diagnoses:  Acute pancreatitis, unspecified pancreatitis type     Governor Rooksebecca Deannie Resetar, MD 12/09/14 1436

## 2014-12-09 NOTE — ED Notes (Signed)
Patient is resting comfortably. 

## 2014-12-09 NOTE — ED Notes (Signed)
Pt reports diarrhea for two weeks and blood in stool for two days. Pt also states "white things" in her stool.

## 2014-12-09 NOTE — ED Notes (Signed)
Protocols initiated.

## 2014-12-09 NOTE — ED Notes (Signed)
Pt finished contrast and ct notified.

## 2014-12-09 NOTE — H&P (Addendum)
[facility] - [place] at [facility]   PATIENT NAME: Susan [name]    MR#:  [id number]  DATE OF BIRTH:  [birth year]  DATE OF ADMISSION:  12/09/2014  PRIMARY CARE PHYSICIAN: Luna Fuse, MD   REQUESTING/REFERRING PHYSICIAN: Governor Rooks  Primary GI - Dr. Ricki Rodriguez  CHIEF COMPLAINT:   Chief Complaint  Patient presents with  . Diarrhea    HISTORY OF PRESENT ILLNESS: Susan [name]  is a 44 y.o. female with a known history of Liver cirrhosis, COPD, Hyperlipidemia- had diarrhea for last 2 weeks have diarrhea- but since yesterday- noted blood on tissues papers on wiping. Also have diffuse abdominal pain- 7-8/10. Feels little better after getting injection in ER. No similar complains in past. No vomiting. No fever.  In ER- Lipase mild high, CT abd- without ac findings.  PAST MEDICAL HISTORY:   Past Medical History  Diagnosis Date  . Hypertension   . Cirrhosis   . Hyperlipidemia   . COPD (chronic obstructive pulmonary disease) 12/09/2014    PAST SURGICAL HISTORY: History reviewed. No pertinent past surgical history.  SOCIAL HISTORY:  History  Substance Use Topics  . Smoking status: Current Every Day Smoker -- 1.00 packs/day    Types: Cigarettes  . Smokeless tobacco: Not on file  . Alcohol Use: No     Comment: Quit drinking few years ago.    FAMILY HISTORY:  Family History  Problem Relation Age of Onset  . Other Mother   . Lung cancer Mother   . Skin cancer Father     DRUG ALLERGIES:  Allergies  Allergen Reactions  . Doxycycline Hives  . Latex Hives  . Promethazine Diarrhea  . Ranitidine Nausea Only  . Zofran [Ondansetron Hcl] Itching    REVIEW OF SYSTEMS:   CONSTITUTIONAL: No fever, fatigue or weakness.  EYES: No blurred or double vision.  EARS, NOSE, AND THROAT: No tinnitus or ear pain.  RESPIRATORY: No cough, shortness of breath, wheezing or hemoptysis.  CARDIOVASCULAR: No chest pain, orthopnea, edema.  GASTROINTESTINAL:  mild nausea, No vomiting, have diarrhea with some blood and abdominal pain.  GENITOURINARY: No dysuria, hematuria.  ENDOCRINE: No polyuria, nocturia,  HEMATOLOGY: No anemia, easy bruising or bleeding SKIN: No rash or lesion. MUSCULOSKELETAL: No joint pain or arthritis.   NEUROLOGIC: No tingling, numbness, weakness.  PSYCHIATRY: No anxiety or depression.   MEDICATIONS AT HOME:  Prior to Admission medications   Medication Sig Start Date End Date Taking? Authorizing Provider  albuterol (PROVENTIL HFA;VENTOLIN HFA) 108 (90 BASE) MCG/ACT inhaler Inhale 1 puff into the lungs every 4 (four) hours as needed.  11/16/14  Yes Historical Provider, MD  budesonide-formoterol (SYMBICORT) 80-4.5 MCG/ACT inhaler Inhale 2 puffs into the lungs daily. 2 (two) times daily. 04/21/14  Yes Historical Provider, MD  ciprofloxacin (CIPRO) 250 MG tablet Take 250 mg by mouth once a week.  11/12/14  Yes Historical Provider, MD  clonazePAM (KLONOPIN) 1 MG tablet Take 1 mg by mouth 2 (two) times daily as needed.    Yes Historical Provider, MD  escitalopram (LEXAPRO) 20 MG tablet Take 20 mg by mouth daily.    Yes Historical Provider, MD  furosemide (LASIX) 40 MG tablet TAKE 1 TABLET BY MOUTH TWICE DAILY FOR FLUID 01/13/14  Yes Historical Provider, MD  ipratropium-albuterol (DUONEB) 0.5-2.5 (3) MG/3ML SOLN once daily as needed. 08/18/14  Yes Historical Provider, MD  loratadine (CLARITIN) 10 MG tablet Take 10 mg by mouth daily.    Yes Historical Provider, MD  omeprazole (  PRILOSEC) 40 MG capsule Take 40 mg by mouth daily.    Yes Historical Provider, MD  Oxycodone HCl 10 MG TABS Take 10 mg by mouth 3 (three) times daily as needed.  11/16/14  Yes Historical Provider, MD  Potassium Chloride ER 20 MEQ TBCR Take 20 mEq by mouth daily.  09/28/14  Yes Historical Provider, MD  spironolactone (ALDACTONE) 100 MG tablet TAKE 1 TABLET BY MOUTH EVERY DAY 11/13/14  Yes Historical Provider, MD      PHYSICAL EXAMINATION:   VITAL SIGNS: Blood  pressure 99/66, pulse 77, temperature 98.4 F (36.9 C), temperature source Oral, resp. rate 18, height 5\' 2"  (1.575 m), weight 160 lb (72.576 kg), SpO2 96 %.  GENERAL:  44 y.o.-year-old patient lying in the bed with no acute distress.  EYES: Pupils equal, round, reactive to light and accommodation. No scleral icterus. Extraocular muscles intact.  HEENT: Head atraumatic, normocephalic. Oropharynx and nasopharynx clear.  NECK:  Supple, no jugular venous distention. No thyroid enlargement, no tenderness.  LUNGS: Normal breath sounds bilaterally, no wheezing, rales,rhonchi or crepitation. No use of accessory muscles of respiration.  CARDIOVASCULAR: S1, S2 normal. No murmurs, rubs, or gallops.  ABDOMEN: Soft, mild tender- generalized , nondistended. Bowel sounds present. No organomegaly or mass.  EXTREMITIES: No pedal edema, cyanosis, or clubbing.  NEUROLOGIC: Cranial nerves II through XII are intact. Muscle strength 5/5 in all extremities. Sensation intact. Gait not checked.  PSYCHIATRIC: The patient is alert and oriented x 3.  SKIN: No obvious rash, lesion, or ulcer.   LABORATORY PANEL:   CBC  Recent Labs Lab 12/09/14 0947  WBC 9.7  HGB 15.8  HCT 45.9  PLT 207  MCV 96.2  MCH 33.2  MCHC 34.5  RDW 13.7  LYMPHSABS 3.7*  MONOABS 0.7  EOSABS 0.2  BASOSABS 0.0   ------------------------------------------------------------------------------------------------------------------  Chemistries   Recent Labs Lab 12/09/14 1155  NA 132*  K 4.0  CL 98*  CO2 23  GLUCOSE 101*  BUN 7  CREATININE 0.55  CALCIUM 8.7*  AST 45*  ALT 35  ALKPHOS 80  BILITOT 0.9   ------------------------------------------------------------------------------------------------------------------ estimated creatinine clearance is 86.3 mL/min (by C-G formula based on Cr of 0.55). ------------------------------------------------------------------------------------------------------------------ No results for  input(s): TSH, T4TOTAL, T3FREE, THYROIDAB in the last 72 hours.  Invalid input(s): FREET3   Coagulation profile No results for input(s): INR, PROTIME in the last 168 hours. ------------------------------------------------------------------------------------------------------------------- No results for input(s): DDIMER in the last 72 hours. -------------------------------------------------------------------------------------------------------------------  Cardiac Enzymes No results for input(s): CKMB, TROPONINI, MYOGLOBIN in the last 168 hours.  Invalid input(s): CK ------------------------------------------------------------------------------------------------------------------ Invalid input(s): POCBNP  ---------------------------------------------------------------------------------------------------------------  Urinalysis    Component Value Date/Time   COLORURINE COLORLESS* 12/09/2014 1030   APPEARANCEUR CLEAR* 12/09/2014 1030   LABSPEC 1.002* 12/09/2014 1030   PHURINE 6.0 12/09/2014 1030   GLUCOSEU NEGATIVE 12/09/2014 1030   HGBUR NEGATIVE 12/09/2014 1030   BILIRUBINUR NEGATIVE 12/09/2014 1030   KETONESUR NEGATIVE 12/09/2014 1030   PROTEINUR NEGATIVE 12/09/2014 1030   NITRITE NEGATIVE 12/09/2014 1030   LEUKOCYTESUR NEGATIVE 12/09/2014 1030     RADIOLOGY: Ct Abdomen Pelvis W Contrast  12/09/2014   CLINICAL DATA:  44 year old female with rectal bleeding diarrhea and right greater than left periumbilical pain. Symptoms for 2 weeks. Initial encounter.  EXAM: CT ABDOMEN AND PELVIS WITH CONTRAST  TECHNIQUE: Multidetector CT imaging of the abdomen and pelvis was performed using the standard protocol following bolus administration of intravenous contrast.  CONTRAST:  100mL OMNIPAQUE IOHEXOL 350 MG/ML SOLN  COMPARISON:  CT  Abdomen and Pelvis 07/08/2012. Abdomen ultrasound 06/19/2014.  FINDINGS: Negative lung bases.  No pericardial or pleural effusion.  No acute osseous abnormality  identified. Chronic pubic rami fractures. Progressed right hip joint degeneration with subchondral sclerosis and lucency since 2013.  No pelvic free fluid. Negative uterus and adnexa. Numerous chronic pelvic phleboliths. Negative distal colon. Unremarkable bladder.  Mildly redundant but otherwise negative sigmoid colon. Fluid level in the distal descending colon. Oral contrast has reached the descending colon. No descending or transverse colonic inflammation. Negative hepatic flexure. Negative right colon and appendix. Negative terminal ileum. No dilated or inflamed small bowel is identified.  Negative stomach except for mild perigastric varices. Minimal distal esophageal varices. Negative duodenum.  Nodular liver contour in keeping with cirrhosis. No discrete liver lesion. Small recannulized paraumbilical vein. Negative gallbladder. No splenomegaly. Negative pancreas and adrenal glands. The portal venous system is patent. Aortoiliac calcified atherosclerosis noted. Major arterial structures are patent. No abdominal free fluid. Renal enhancement and contrast excretion within normal limits. No lymphadenopathy.  IMPRESSION: Chronic cirrhosis with no acute or inflammatory process identified in the abdomen or pelvis.   Electronically Signed   By: Odessa FlemingH  Hall M.D.   On: 12/09/2014 13:30    EKG: Orders placed or performed during the hospital encounter of 12/09/14  . ED EKG  . ED EKG    IMPRESSION AND PLAN:  * Diarrhea   Likely ac gastroenteritis- as CT abdomen negative  * GI bleed   Likely hemorrhoidal bleed- as she had in past.    Hb stable    WIll check Hb 8 hrly, called GI as she have cirrhosis also.  * COPD   Stable- no wheezing. Cont home inhalers.  * Cirrhosis   Stable, Follows with Dr. Ricki RodriguezSkulski.  * abdominal pain   Likely due to above   Oxycodon to help pain.  * Smoker-     Councelled for 4 in to quit.    Nicotin inhalers.  All the records are reviewed and case discussed with ED  provider. Management plans discussed with the patient, family and they are in agreement.  CODE STATUS: full code    TOTAL TIME TAKING CARE OF THIS PATIENT: 50 minutes.    Altamese DillingVACHHANI, Aaren Krog M.D on 12/09/2014 at 3:39 PM  Between 7am to 6pm - Pager - [phone]  After 6pm go to [email or web address] - password [email or web address] [facility]  [facility]  Office  [phone]  CC: Primary care physician; Luna FuseEJAN-SIE, SHEIKH AHMED, MD

## 2014-12-09 NOTE — Plan of Care (Signed)
Problem: Discharge Progression Outcomes Goal: Discharge plan in place and appropriate Outcome: Progressing 1. Likes to be called Susan [name] 2. Lives at home with 2 children 3. H/O hypertension, hyperlipidemia, COPD and cirrhosis controlled by meds at home;  4. Adm dx diarrhea; followed MD orders in [facility], review results; monitor pt's stool output

## 2014-12-09 NOTE — ED Notes (Signed)
MD at bedside. 

## 2014-12-09 NOTE — ED Notes (Signed)
Vital signs stable. 

## 2014-12-10 ENCOUNTER — Ambulatory Visit: Admission: RE | Admit: 2014-12-10 | Payer: Medicaid Other | Source: Ambulatory Visit

## 2014-12-10 LAB — C DIFFICILE QUICK SCREEN W PCR REFLEX
C DIFFICILE (CDIFF) TOXIN: NEGATIVE
C Diff antigen: NEGATIVE
C Diff interpretation: NEGATIVE

## 2014-12-10 LAB — BASIC METABOLIC PANEL WITH GFR
Anion gap: 8 (ref 5–15)
BUN: 10 mg/dL (ref 6–20)
CO2: 26 mmol/L (ref 22–32)
Calcium: 9.3 mg/dL (ref 8.9–10.3)
Chloride: 99 mmol/L — ABNORMAL LOW (ref 101–111)
Creatinine, Ser: 0.58 mg/dL (ref 0.44–1.00)
GFR calc Af Amer: >60 mL/min
GFR calc non Af Amer: >60 mL/min
Glucose, Bld: 99 mg/dL (ref 65–99)
Potassium: 3.8 mmol/L (ref 3.5–5.1)
Sodium: 133 mmol/L — ABNORMAL LOW (ref 135–145)

## 2014-12-10 LAB — HEMOGLOBIN
Hemoglobin: 14.4 g/dL (ref 12.0–16.0)
Hemoglobin: 14 g/dL (ref 12.0–16.0)
Hemoglobin: 13.5 g/dL (ref 12.0–16.0)

## 2014-12-10 LAB — CBC
HCT: 42.2 % (ref 35.0–47.0)
Hemoglobin: 14.4 g/dL (ref 12.0–16.0)
MCH: 33 pg (ref 26.0–34.0)
MCHC: 34 g/dL (ref 32.0–36.0)
MCV: 97.1 fL (ref 80.0–100.0)
PLATELETS: 147 10*3/uL — AB (ref 150–440)
RBC: 4.35 MIL/uL (ref 3.80–5.20)
RDW: 13.5 % (ref 11.5–14.5)
WBC: 7.8 10*3/uL (ref 3.6–11.0)

## 2014-12-10 MED ORDER — NICOTINE 14 MG/24HR TD PT24
14.0000 mg | MEDICATED_PATCH | Freq: Every day | TRANSDERMAL | Status: DC
  Administered 2014-12-10 – 2014-12-12 (×3): 14 mg via TRANSDERMAL
  Filled 2014-12-10 (×3): qty 1

## 2014-12-10 MED ORDER — HYDROCORTISONE ACETATE 25 MG RE SUPP
25.0000 mg | Freq: Two times a day (BID) | RECTAL | Status: DC
  Administered 2014-12-10 – 2014-12-12 (×5): 25 mg via RECTAL
  Filled 2014-12-10 (×9): qty 1

## 2014-12-10 NOTE — H&P (Addendum)
[facility] - [place] at [facility]  Progress note by me for May 5th,not H&P PATIENT NAME: Susan CapriceChristy Nodarse    MR#:  [id number]  DATE OF BIRTH:  11/06/1970  SUBJECTIVE:  CHIEF COMPLAINT:   Chief Complaint  Patient presents with  . Diarrhea   admitted for diarrhea, blood in her stool. Two Episodes of diarrhea since admission but no blood is noted. Denies any nausea or vomiting. Hemodynamic is stable.  patient has history of fall, with liver cirrhosis, COPD, hyperlipidemia. Sees Dr. Marva PandaSkulskie  as an outpatient. The patient saw Dr. Marva PandaSkulskie on April 27th . She is on Cipro once a week for SBP . Prophylaxis, on Lasix and Aldactone at home. Patient has been having diarrhea for 2 weeks associated with some midepigastric abdominal pain. No nausea no vomiting no fever. Patient noticed some blood in urine or blood in her stool but her hemoglobin has been stable. GI consult is pending.  REVIEW OF SYSTEMS:    Review of Systems  Constitutional: Negative for fever, chills, weight loss, malaise/fatigue and diaphoresis.  HENT: Positive for hearing loss. Negative for congestion.   Eyes: Negative for blurred vision and pain.  Respiratory: Negative for cough.   Cardiovascular: Negative for chest pain and leg swelling.  Gastrointestinal: Positive for abdominal pain. Negative for nausea, vomiting and diarrhea.  Genitourinary: Negative for dysuria.  Musculoskeletal: Negative for back pain.  Skin: Negative for itching and rash.  Neurological: Negative for dizziness, tremors, weakness and headaches.  Endo/Heme/Allergies: Does not bruise/bleed easily.  Psychiatric/Behavioral: Negative for depression and memory loss.    Nutrition: Tolerating Liquid diet, she is tolerating it and we will advance to soft diet. Tolerating Diet: Tolerating PT:      DRUG ALLERGIES:   Allergies  Allergen Reactions  . Doxycycline Hives  . Latex Hives  . Promethazine Diarrhea  . Ranitidine Nausea  Only  . Zofran [Ondansetron Hcl] Itching    VITALS:  Blood pressure 107/45, pulse 63, temperature 97.7 F (36.5 C), temperature source Oral, resp. rate 20, height 5\' 2"  (1.575 m), weight 72.576 kg (160 lb), SpO2 97 %.  PHYSICAL EXAMINATION:   Physical Exam  GENERAL:  44 y.o.-year-old patient lying in the bed with no acute distress.  EYES: Pupils equal, round, reactive to light and accommodation. No scleral icterus. Extraocular muscles intact.  HEENT: Head atraumatic, normocephalic. Oropharynx and nasopharynx clear.  NECK:  Supple, no jugular venous distention. No thyroid enlargement, no tenderness.  LUNGS: Normal breath sounds bilaterally, no wheezing, rales,rhonchi or crepitation. No use of accessory muscles of respiration.  CARDIOVASCULAR: S1, S2 normal. No murmurs, rubs, or gallops.  ABDOMEN: Midepigastric tenderness present no rebound tenderness nondistended . Bowel sounds present. No organomegaly or mass.  EXTREMITIES: No pedal edema, cyanosis, or clubbing.  NEUROLOGIC: Cranial nerves II through XII are intact. Muscle strength 5/5 in all extremities. Sensation intact. Gait not checked.  PSYCHIATRIC: The patient is alert and oriented x 3.  SKIN: No obvious rash, lesion, or ulcer.    LABORATORY PANEL:   CBC  Recent Labs Lab 12/10/14 0241  WBC 7.8  HGB 14.4  14.4  HCT 42.2  PLT 147*   ------------------------------------------------------------------------------------------------------------------  Chemistries   Recent Labs Lab 12/09/14 1155 12/10/14 0241  NA 132* 133*  K 4.0 3.8  CL 98* 99*  CO2 23 26  GLUCOSE 101* 99  BUN 7 10  CREATININE 0.55 0.58  CALCIUM 8.7* 9.3  AST 45*  --   ALT 35  --  ALKPHOS 80  --   BILITOT 0.9  --    ------------------------------------------------------------------------------------------------------------------  Cardiac Enzymes No results for input(s): TROPONINI in the last 168  hours. ------------------------------------------------------------------------------------------------------------------  RADIOLOGY:  Ct Abdomen Pelvis W Contrast  12/09/2014   CLINICAL DATA:  44 year old female with rectal bleeding diarrhea and right greater than left periumbilical pain. Symptoms for 2 weeks. Initial encounter.  EXAM: CT ABDOMEN AND PELVIS WITH CONTRAST  TECHNIQUE: Multidetector CT imaging of the abdomen and pelvis was performed using the standard protocol following bolus administration of intravenous contrast.  CONTRAST:  100mL OMNIPAQUE IOHEXOL 350 MG/ML SOLN  COMPARISON:  CT Abdomen and Pelvis 07/08/2012. Abdomen ultrasound 06/19/2014.  FINDINGS: Negative lung bases.  No pericardial or pleural effusion.  No acute osseous abnormality identified. Chronic pubic rami fractures. Progressed right hip joint degeneration with subchondral sclerosis and lucency since 2013.  No pelvic free fluid. Negative uterus and adnexa. Numerous chronic pelvic phleboliths. Negative distal colon. Unremarkable bladder.  Mildly redundant but otherwise negative sigmoid colon. Fluid level in the distal descending colon. Oral contrast has reached the descending colon. No descending or transverse colonic inflammation. Negative hepatic flexure. Negative right colon and appendix. Negative terminal ileum. No dilated or inflamed small bowel is identified.  Negative stomach except for mild perigastric varices. Minimal distal esophageal varices. Negative duodenum.  Nodular liver contour in keeping with cirrhosis. No discrete liver lesion. Small recannulized paraumbilical vein. Negative gallbladder. No splenomegaly. Negative pancreas and adrenal glands. The portal venous system is patent. Aortoiliac calcified atherosclerosis noted. Major arterial structures are patent. No abdominal free fluid. Renal enhancement and contrast excretion within normal limits. No lymphadenopathy.  IMPRESSION: Chronic cirrhosis with no acute or  inflammatory process identified in the abdomen or pelvis.   Electronically Signed   By: Odessa FlemingH  Hall M.D.   On: 12/09/2014 13:30     ASSESSMENT AND PLAN:   Chronic diarrhea: Likely due to chronic liver disease. Her stool cultures are pending., Advance her diet to soft diet today. GI consult with Dr.Skulskie requested  2. LIVER cirrhosis. Supposed to get ultrasound of the outpatient we will request GI to decide on that. Continue Lasix, Aldactone, weekly Cipro. Patient noted to have mild portal hypertension by CT abdomen. #3 history: Of alcohol abuse cut  down drinking but still using alcohol. #4 history of anxiety, depression continue Klonopin, Lexapro. #5. history of COPD continue Symbicort patient is not at  wheezingthis time. possible discharge tomorrow.     All the records are reviewed and case discussed with Care Management/Social Workerr. Management plans discussed with the patient, family and they are in agreement.  CODE STATUS:full code  TOTAL TIME TAKING CARE OF THIS PATIENT: 35  minutes.   POSSIBLE D/C IN 1-2  DAYS, DEPENDING ON CLINICAL CONDITION.   Katha HammingKONIDENA,Denise Washburn M.D on 12/10/2014 at 9:56 AM  Between 7am to 6pm - Pager - [phone]  After 6pm go to [email or web address] - password [email or web address] [facility]  [facility]  Office  [phone]  CC: Primary care physician; Luna FuseEJAN-SIE, SHEIKH AHMED, MD

## 2014-12-10 NOTE — Consult Note (Signed)
Consultation  Referring Provider: Dr. Desiree HaneVachani  Primary Care Physician:  Luna FuseEJAN-SIE, SHEIKH AHMED, MD Consulting  Gastroenterologist: Dr. Lynnae Prudeobert Elliott MD Reason for Consultation: Bloody diarrhea          HPI:   Susan [name] is a 44 y.o. female with known alcoholic liver disease. She reports mild diarrhea that started last week described as  once or twice a day. She noted bright red blood per rectum 48 hours ago. She had blood on the toilet tissue similar to her menstrual cycle. She started to see some blood in the water when she voided. She had 3-4 episodes of rectal bleeding Tuesday, less yesterday. She  had some crampiness relieved with bowel movement. Today she had one small bowel movement no blood was seen. She denies any abdominal pain, rectal pain, positive some rectal burning. No use of Lactulose or laxatives.  She denies constipation.  She underwent a colonoscopy 03/24/2013 for personal history of colon polyps. There was one small tubular adenomatous polyp, no rectal or anal abnormalities. No mention of diverticulosis or hemorrhoids.  Past Medical History  Diagnosis Date  . Hypertension   . Cirrhosis   . Hyperlipidemia   . COPD (chronic obstructive pulmonary disease) 12/09/2014  . Allergy   . Anemia   . Anxiety   . Asthma   . Depression   . Emphysema of lung   . GERD (gastroesophageal reflux disease)   . Heart murmur     Past Surgical History  Procedure Laterality Date  . Cesarean section  2009    Family History  Problem Relation Age of Onset  . Other Mother   . Lung cancer Mother   . Skin cancer Father      History  Substance Use Topics  . Smoking status: Current Every Day Smoker -- 1.00 packs/day for 20 years    Types: Cigarettes  . Smokeless tobacco: Not on file  . Alcohol Use: No     Comment: Quit drinking few years ago.    Prior to Admission medications   Medication Sig Start Date End Date Taking? Authorizing Provider  albuterol (PROVENTIL  HFA;VENTOLIN HFA) 108 (90 BASE) MCG/ACT inhaler Inhale 1 puff into the lungs every 4 (four) hours as needed.  11/16/14  Yes Historical Provider, MD  budesonide-formoterol (SYMBICORT) 80-4.5 MCG/ACT inhaler Inhale 2 puffs into the lungs daily. 2 (two) times daily. 04/21/14  Yes Historical Provider, MD  ciprofloxacin (CIPRO) 250 MG tablet Take 250 mg by mouth once a week.  11/12/14  Yes Historical Provider, MD  clonazePAM (KLONOPIN) 1 MG tablet Take 1 mg by mouth 2 (two) times daily as needed.    Yes Historical Provider, MD  escitalopram (LEXAPRO) 20 MG tablet Take 20 mg by mouth daily.    Yes Historical Provider, MD  furosemide (LASIX) 40 MG tablet TAKE 1 TABLET BY MOUTH TWICE DAILY FOR FLUID 01/13/14  Yes Historical Provider, MD  ipratropium-albuterol (DUONEB) 0.5-2.5 (3) MG/3ML SOLN once daily as needed. 08/18/14  Yes Historical Provider, MD  loratadine (CLARITIN) 10 MG tablet Take 10 mg by mouth daily.    Yes Historical Provider, MD  omeprazole (PRILOSEC) 40 MG capsule Take 40 mg by mouth daily.    Yes Historical Provider, MD  Oxycodone HCl 10 MG TABS Take 10 mg by mouth 3 (three) times daily as needed.  11/16/14  Yes Historical Provider, MD  Potassium Chloride ER 20 MEQ TBCR Take 20 mEq by mouth daily.  09/28/14  Yes Historical Provider, MD  spironolactone (  ALDACTONE) 100 MG tablet TAKE 1 TABLET BY MOUTH EVERY DAY 11/13/14  Yes Historical Provider, MD    Current Facility-Administered Medications  Medication Dose Route Frequency Provider Last Rate Last Dose  . albuterol (PROVENTIL) (2.5 MG/3ML) 0.083% nebulizer solution 2.5 mg  2.5 mg Nebulization Q4H PRN Altamese Dilling, MD      . budesonide-formoterol (SYMBICORT) 80-4.5 MCG/ACT inhaler 2 puff  2 puff Inhalation BID Altamese Dilling, MD   2 puff at 12/10/14 1000  . clonazePAM (KLONOPIN) tablet 1 mg  1 mg Oral BID PRN Altamese Dilling, MD   1 mg at 12/10/14 1333  . escitalopram (LEXAPRO) tablet 20 mg  20 mg Oral Daily Altamese Dilling,  MD   20 mg at 12/10/14 0957  . hydrocortisone (ANUSOL-HC) suppository 25 mg  25 mg Rectal BID Theadore Nan, NP      . loratadine (CLARITIN) tablet 10 mg  10 mg Oral Daily Altamese Dilling, MD   10 mg at 12/10/14 0957  . nicotine (NICODERM CQ - dosed in mg/24 hours) patch 14 mg  14 mg Transdermal Daily Katha Hamming, MD   14 mg at 12/10/14 1323  . nicotine (NICOTROL) 10 MG inhaler 1 continuous puffing  1 continuous puffing Inhalation PRN Altamese Dilling, MD   1 continuous puffing at 12/09/14 1946  . oxyCODONE (Oxy IR/ROXICODONE) immediate release tablet 10 mg  10 mg Oral TID PRN Altamese Dilling, MD   10 mg at 12/10/14 1049  . pantoprazole (PROTONIX) EC tablet 40 mg  40 mg Oral Daily Altamese Dilling, MD   40 mg at 12/10/14 0957  . spironolactone (ALDACTONE) tablet 25 mg  25 mg Oral Daily Altamese Dilling, MD   25 mg at 12/10/14 0957    Allergies as of 12/09/2014 - Review Complete 12/09/2014  Allergen Reaction Noted  . Doxycycline Hives 12/09/2014  . Latex Hives 12/09/2014  . Promethazine Diarrhea 12/09/2014  . Ranitidine Nausea Only 12/09/2014  . Zofran [ondansetron hcl] Itching 12/09/2014     Review of Systems:    A 12 system review was obtained and positive for pruritus that is chronic. Her son has a rash between his legs. She lives with one cat and five kittens. Denies change in medication. She takes  chronic oxycodone for leg and back pain. She has been off of all alcohol for at least one year. Denies NSAID use. She denies illicit drug use. Remaining systems all negative except where noted in HPI.    Physical Exam:  Vital signs in last 24 hours: Temp:  [97.5 F (36.4 C)-98.4 F (36.9 C)] 97.6 F (36.4 C) (05/05 1339) Pulse Rate:  [63-80] 73 (05/05 1339) Resp:  [18-20] 18 (05/05 1339) BP: (93-107)/(45-68) 93/54 mmHg (05/05 1339) SpO2:  [95 %-97 %] 97 % (05/05 1339) Last BM Date: 12/09/14  General:  Well-developed, well-nourished and in no  acute distress. She appears disheveled  Head:  Head without obvious abnormality, atraumatic  Eyes:   Conjunctiva is injecte, sclera anicteric   ENT:   Mouth free of lesions, mucosa moist, tongue pink, no thrush noted, poor dentition  Neck:   Supple w/o thyromegaly or mass, trachea midline, no adenopathy  Lungs: Clear to auscultation bilaterally, respirations unlabored Heart:     Normal S1S2, no rubs, murmurs, gallops. Abdomen: Soft, non-tender, slight  Hepatomegaly, no splenomegaly, no hernia, or mass and BS normal Rectal: Deferred Lymph:  No cervical or supraclavicular adenopathy. Extremities:   No edema, cyanosis, or clubbing Skin  Skin color sallow, constantly scratching her  arms with tiny red dots and scratch marks on arms- question scabies, versus liver disease. Bilirubin is not high. She takes chronic narcotics which may be the cause. Back with scratch marks- no drug rash.  Neuro:  A&O x 3. CNII-XII intact, normal strength Psych:  Appropriate mood and affect, but looks a little anxious. Denies any recent alcohol use in 1 year.   Data Reviewed:  LAB RESULTS:  Recent Labs  12/09/14 0947  12/10/14 0241 12/10/14 0951  WBC 9.7  --  7.8  --   HGB 15.8  < > 14.4  14.4 14.0  HCT 45.9  --  42.2  --   PLT 207  --  147*  --   < > = values in this interval not displayed. BMET  Recent Labs  12/09/14 1155 12/10/14 0241  NA 132* 133*  K 4.0 3.8  CL 98* 99*  CO2 23 26  GLUCOSE 101* 99  BUN 7 10  CREATININE 0.55 0.58  CALCIUM 8.7* 9.3   LFT  Recent Labs  12/09/14 1155  PROT 7.9  ALBUMIN 4.2  AST 45*  ALT 35  ALKPHOS 80  BILITOT 0.9   PT/INR No results for input(s): LABPROT, INR in the last 72 hours.  STUDIES: Ct Abdomen Pelvis W Contrast  12/09/2014   CLINICAL DATA:  44 year old female with rectal bleeding diarrhea and right greater than left periumbilical pain. Symptoms for 2 weeks. Initial encounter.  EXAM: CT ABDOMEN AND PELVIS WITH CONTRAST  TECHNIQUE:  Multidetector CT imaging of the abdomen and pelvis was performed using the standard protocol following bolus administration of intravenous contrast.  CONTRAST:  100mL OMNIPAQUE IOHEXOL 350 MG/ML SOLN  COMPARISON:  CT Abdomen and Pelvis 07/08/2012. Abdomen ultrasound 06/19/2014.  FINDINGS: Negative lung bases.  No pericardial or pleural effusion.  No acute osseous abnormality identified. Chronic pubic rami fractures. Progressed right hip joint degeneration with subchondral sclerosis and lucency since 2013.  No pelvic free fluid. Negative uterus and adnexa. Numerous chronic pelvic phleboliths. Negative distal colon. Unremarkable bladder.  Mildly redundant but otherwise negative sigmoid colon. Fluid level in the distal descending colon. Oral contrast has reached the descending colon. No descending or transverse colonic inflammation. Negative hepatic flexure. Negative right colon and appendix. Negative terminal ileum. No dilated or inflamed small bowel is identified.  Negative stomach except for mild perigastric varices. Minimal distal esophageal varices. Negative duodenum.  Nodular liver contour in keeping with cirrhosis. No discrete liver lesion. Small recannulized paraumbilical vein. Negative gallbladder. No splenomegaly. Negative pancreas and adrenal glands. The portal venous system is patent. Aortoiliac calcified atherosclerosis noted. Major arterial structures are patent. No abdominal free fluid. Renal enhancement and contrast excretion within normal limits. No lymphadenopathy.  IMPRESSION: Chronic cirrhosis with no acute or inflammatory process identified in the abdomen or pelvis.   Electronically Signed   By: Odessa FlemingH  Hall M.D.   On: 12/09/2014 13:30     Assessment:  Susan CannerChristy P [name] is a 44 y.o. with history of alcoholic cirrhosis and adenomatous colon polyps, resents with right red blood and mild diarrhea. It is no abdominal pain or tenderness. He has decreased. She does have a history of colonoscopy and upper  endoscopy 2014. Etiology of her bleeding could certainly be from internal hemorrhoids. She also has pruritus with red  rash on her arms.  Plan:  Continue to monitor with serial hemoglobin. With Anusol before meals suppository recommended a dermatology consult for her pruritus is out of proportion to her bilirubin. No recommendation for  luminal evaluation at this time.   This case was discussed with Dr. Scot Jun in collaboration of care. Thank you for the consultation.  These services provided by Amedeo Kinsman RN, MSN, ANP-BC under collaborative practice agreement with Scot Jun, MD.  12/10/2014, 3:29 PM

## 2014-12-11 LAB — HEMOGLOBIN: Hemoglobin: 13.3 g/dL (ref 12.0–16.0)

## 2014-12-11 MED ORDER — FLUCONAZOLE IN SODIUM CHLORIDE 200-0.9 MG/100ML-% IV SOLN
200.0000 mg | INTRAVENOUS | Status: DC
  Filled 2014-12-11: qty 100

## 2014-12-11 MED ORDER — FLUCONAZOLE IN SODIUM CHLORIDE 200-0.9 MG/100ML-% IV SOLN
200.0000 mg | INTRAVENOUS | Status: DC
Start: 2014-12-11 — End: 2014-12-11
  Filled 2014-12-11 (×2): qty 100

## 2014-12-11 MED ORDER — FLUCONAZOLE IN SODIUM CHLORIDE 200-0.9 MG/100ML-% IV SOLN
200.0000 mg | INTRAVENOUS | Status: DC
  Administered 2014-12-11: 15:00:00 200 mg via INTRAVENOUS
  Filled 2014-12-11 (×2): qty 100

## 2014-12-11 MED ORDER — OXYCODONE HCL 5 MG PO TABS
10.0000 mg | ORAL_TABLET | Freq: Three times a day (TID) | ORAL | Status: DC | PRN
  Administered 2014-12-11 – 2014-12-12 (×3): 10 mg via ORAL
  Filled 2014-12-11 (×3): qty 2

## 2014-12-11 MED ORDER — HYDROXYZINE HCL 25 MG PO TABS
25.0000 mg | ORAL_TABLET | Freq: Three times a day (TID) | ORAL | Status: DC | PRN
Start: 2014-12-11 — End: 2014-12-12
  Administered 2014-12-11 – 2014-12-12 (×3): 25 mg via ORAL
  Filled 2014-12-11 (×4): qty 1

## 2014-12-11 NOTE — H&P (Signed)
[facility] [place] [facility]  Progress note by me for May 5th,not H&P PATIENT NAME: Susan [name]    MR#:  [id number]  DATE OF BIRTH:  07/11/1971  SUBJECTIVE:  CHIEF COMPLAINT:   Chief Complaint  Patient presents with  . Diarrhea   admitted for diarrhea, blood in her stool. Still has loose  Stools but no blood.c/o vaginal itching and yeat like symptoms.has general;ized pruritus.  patient has history of alcoholic , with liver cirrhosis, COPD, hyperlipidemia. Sees Dr. Marva PandaSkulskie  as an outpatient. The patient saw Dr. Marva PandaSkulskie on April 27th . She is on Cipro once a week for SBP . Prophylaxis, on Lasix and Aldactone at home. Patient has been having diarrhea for 2 weeks associated with some midepigastric abdominal pain. No nausea no vomiting no fever. Patient noticed some blood in urine or blood in her stool but her hemoglobin has been stable. GI consult is pending.  REVIEW OF SYSTEMS:    Review of Systems  Constitutional: Negative for fever and chills.  HENT: Negative for ear pain and hearing loss.   Eyes: Negative for blurred vision and photophobia.  Respiratory: Positive for cough. Negative for hemoptysis and sputum production.   Cardiovascular: Negative for chest pain and palpitations.  Gastrointestinal: Positive for diarrhea. Negative for heartburn, nausea and vomiting.  Genitourinary: Negative for dysuria.  Musculoskeletal: Negative for myalgias and neck pain.  Neurological: Negative for dizziness, tingling and headaches.  Endo/Heme/Allergies: Does not bruise/bleed easily.  Psychiatric/Behavioral: Negative for depression and suicidal ideas.    Nutrition: Tolerating Liquid diet, she is tolerating it and we will advance to soft diet. Tolerating Diet: Tolerating PT:      DRUG ALLERGIES:   Allergies  Allergen Reactions  . Doxycycline Hives  . Latex Hives  . Promethazine Diarrhea  . Ranitidine Nausea Only  . Zofran [Ondansetron Hcl] Itching     VITALS:  Blood pressure 110/66, pulse 77, temperature 97.9 F (36.6 C), temperature source Oral, resp. rate 18, height 5\' 2"  (1.575 m), weight 72.576 kg (160 lb), SpO2 100 %.  PHYSICAL EXAMINATION:   Physical Exam  GENERAL:  44 y.o.-year-old patient lying in the bed with no acute distress.  EYES: Pupils equal, round, reactive to light and accommodation. No scleral icterus. Extraocular muscles intact.  HEENT: Head atraumatic, normocephalic. Oropharynx and nasopharynx clear.  NECK:  Supple, no jugular venous distention. No thyroid enlargement, no tenderness.  LUNGS: Normal breath sounds bilaterally, no wheezing, rales,rhonchi or crepitation. No use of accessory muscles of respiration.  CARDIOVASCULAR: S1, S2 normal. No murmurs, rubs, or gallops.  ABDOMEN: Midepigastric tenderness present no rebound tenderness nondistended . Bowel sounds present. No organomegaly or mass.  EXTREMITIES: No pedal edema, cyanosis, or clubbing.  NEUROLOGIC: Cranial nerves II through XII are intact. Muscle strength 5/5 in all extremities. Sensation intact. Gait not checked.  PSYCHIATRIC: The patient is alert and oriented x 3.  SKIN: No obvious rash, lesion, or ulcer.    LABORATORY PANEL:   CBC  Recent Labs Lab 12/10/14 0241  12/11/14 0045  WBC 7.8  --   --   HGB 14.4  14.4  < > 13.3  HCT 42.2  --   --   PLT 147*  --   --   < > = values in this interval not displayed. ------------------------------------------------------------------------------------------------------------------  Chemistries   Recent Labs Lab 12/09/14 1155 12/10/14 0241  NA 132* 133*  K 4.0 3.8  CL 98* 99*  CO2 23 26  GLUCOSE 101* 99  BUN 7 10  CREATININE 0.55 0.58  CALCIUM 8.7* 9.3  AST 45*  --   ALT 35  --   ALKPHOS 80  --   BILITOT 0.9  --    ------------------------------------------------------------------------------------------------------------------  Cardiac Enzymes No results for input(s): TROPONINI  in the last 168 hours. ------------------------------------------------------------------------------------------------------------------  RADIOLOGY:  Ct Abdomen Pelvis W Contrast  12/09/2014   CLINICAL DATA:  44 year old female with rectal bleeding diarrhea and right greater than left periumbilical pain. Symptoms for 2 weeks. Initial encounter.  EXAM: CT ABDOMEN AND PELVIS WITH CONTRAST  TECHNIQUE: Multidetector CT imaging of the abdomen and pelvis was performed using the standard protocol following bolus administration of intravenous contrast.  CONTRAST:  100mL OMNIPAQUE IOHEXOL 350 MG/ML SOLN  COMPARISON:  CT Abdomen and Pelvis 07/08/2012. Abdomen ultrasound 06/19/2014.  FINDINGS: Negative lung bases.  No pericardial or pleural effusion.  No acute osseous abnormality identified. Chronic pubic rami fractures. Progressed right hip joint degeneration with subchondral sclerosis and lucency since 2013.  No pelvic free fluid. Negative uterus and adnexa. Numerous chronic pelvic phleboliths. Negative distal colon. Unremarkable bladder.  Mildly redundant but otherwise negative sigmoid colon. Fluid level in the distal descending colon. Oral contrast has reached the descending colon. No descending or transverse colonic inflammation. Negative hepatic flexure. Negative right colon and appendix. Negative terminal ileum. No dilated or inflamed small bowel is identified.  Negative stomach except for mild perigastric varices. Minimal distal esophageal varices. Negative duodenum.  Nodular liver contour in keeping with cirrhosis. No discrete liver lesion. Small recannulized paraumbilical vein. Negative gallbladder. No splenomegaly. Negative pancreas and adrenal glands. The portal venous system is patent. Aortoiliac calcified atherosclerosis noted. Major arterial structures are patent. No abdominal free fluid. Renal enhancement and contrast excretion within normal limits. No lymphadenopathy.  IMPRESSION: Chronic cirrhosis with  no acute or inflammatory process identified in the abdomen or pelvis.   Electronically Signed   By: Odessa FlemingH  Hall M.D.   On: 12/09/2014 13:30     ASSESSMENT AND PLAN:   Chronic diarrhea: Likely due to chronic liver disease. Her stool cultures are pending., Advance her diet to  Regular diet today. Seen by GI  ,follow stool cultures 2. LIVER cirrhosis. Ultrasound liver in am ordered,, decide on that. Continue Lasix, Aldactone, weekly Cipro. Patient noted to have mild portal hypertension by CT abdomen. #3 history: Of alcohol abuse cut  down drinking but still using alcohol.,has pruritus;likley due to liver disease;dermatology consult pending #4 history of anxiety, depression continue Klonopin, Lexapro. #5. history of COPD continue Symbicort patient is not at  wheezingthis time.  possible discharge tomorrow.  likely d/c am after ultrasound 6.vaginits/yeast;add diflucan    All the records are reviewed and case discussed with Care Management/Social Workerr. Management plans discussed with the patient, family and they are in agreement.  CODE STATUS:full code  TOTAL TIME TAKING CARE OF THIS PATIENT: 35  minutes.   POSSIBLE D/C IN 1-2  DAYS, DEPENDING ON CLINICAL CONDITION.   Katha HammingKONIDENA,Jecenia Leamer M.D on 12/11/2014 at 12:21 PM  Between 7am to 6pm - Pager - [phone]  After 6pm go to [email or web address] - password [email or web address]  [facility]  Office  [phone]  CC: Primary care physician; Luna FuseEJAN-SIE, SHEIKH AHMED, MD

## 2014-12-11 NOTE — Consult Note (Signed)
Pt VSS afeb, O2 sat 100%, hgb 13.3, C. Diff neg. 2 stools during the night, described as brown, some what loose, and had mucus in it.  none today. I think she could go home today or tomorrow and follow up as out pt.

## 2014-12-11 NOTE — Progress Notes (Signed)
Patient requested for oxycodone 10 mg oral  Prn for  Right upper quadrant  abdominal pain. On re-assessment, patient denied pain.  Patient is now resting quietly at this time with her eyes closed, respirations even and unlabored.  Patient is requested for Clonazepam 1 mg for anxiety which was effective per patient.

## 2014-12-11 NOTE — Plan of Care (Signed)
Problem: Discharge Progression Outcomes Goal: Other Discharge Outcomes/Goals Outcome: Progressing Pain meds x2 today. No stools. Possible discharge tomorrow after U/S abd

## 2014-12-12 ENCOUNTER — Observation Stay: Payer: Medicaid Other

## 2014-12-12 DIAGNOSIS — K7460 Unspecified cirrhosis of liver: Secondary | ICD-10-CM

## 2014-12-12 LAB — HEMOGLOBIN
Hemoglobin: 13 g/dL (ref 12.0–16.0)
Hemoglobin: 13.4 g/dL (ref 12.0–16.0)

## 2014-12-12 MED ORDER — HYDROXYZINE HCL 25 MG PO TABS: 25.0000 mg | ORAL_TABLET | Freq: Three times a day (TID) | ORAL | Status: DC | PRN

## 2014-12-12 MED ORDER — HYDROCORTISONE ACETATE 25 MG RE SUPP: 25.0000 mg | Freq: Two times a day (BID) | RECTAL | Status: DC

## 2014-12-12 NOTE — Discharge Summary (Signed)
[facility] - [place] at [facility]  DISCHARGE SUMMARY   PATIENT NAME: Susan [name]    MR#:  [id number]  DATE OF BIRTH:  02/14/1971  DATE OF ADMISSION:  12/09/2014 ADMITTING PHYSICIAN: Altamese DillingVaibhavkumar Vachhani, MD  DATE OF DISCHARGE: 12/12/2014  PRIMARY CARE PHYSICIAN: Luna FuseEJAN-SIE, SHEIKH AHMED, MD    ADMISSION DIAGNOSIS:  Acute pancreatitis, unspecified pancreatitis type [K85.9]  DISCHARGE DIAGNOSIS:  Principal Problem:   Diarrhea Active Problems:   Blood in stool   Abdominal pain   Hepatic cirrhosis  SECONDARY DIAGNOSIS:   Past Medical History  Diagnosis Date  . Hypertension   . Cirrhosis   . Hyperlipidemia   . COPD (chronic obstructive pulmonary disease) 12/09/2014  . Allergy   . Anemia   . Anxiety   . Asthma   . Depression   . Emphysema of lung   . GERD (gastroesophageal reflux disease)   . Heart murmur     HOSPITAL COURSE:   1) Chronic diarrhea:  -  Likely due to chronic liver disease.  - C. Diff negative - tolerating regular diet - GI ok with discharge, she will follow up with Dr. Marva PandaSkulskie as outpatient - on day of dc is having normal BM's and tolerating regular diet  2. LIVER cirrhosis.  - Ultrasound liver today, will review results with Dr. Ricki RodriguezSkulski at follow up - Continue Lasix, Aldactone, weekly Cipro.  - does have mild portal hypertension by CT abdomen. - atarax is helping with has pruritus;likley due to liver disease  3  Alcohol abuse -  cut down drinking but still using alcohol  4  anxiety,  - depression continue Klonopin, Lexapro.  5.COPD  - continue Symbicort patient is not at wheezingthis time.   6.vaginits/yeast - treated with diflucan    DISCHARGE CONDITIONS:   Stable  CONSULTS OBTAINED:    Gastroenterology Dr Markham JordanElliot  DRUG ALLERGIES:   Allergies  Allergen Reactions  . Doxycycline Hives  . Latex Hives  . Promethazine Diarrhea  . Ranitidine Nausea Only  . Zofran [Ondansetron Hcl] Itching     DISCHARGE MEDICATIONS:   Current Discharge Medication List    START taking these medications   Details  hydrocortisone (ANUSOL-HC) 25 MG suppository Place 1 suppository (25 mg total) rectally 2 (two) times daily. Qty: 12 suppository, Refills: 0    hydrOXYzine (ATARAX/VISTARIL) 25 MG tablet Take 1 tablet (25 mg total) by mouth 3 (three) times daily as needed (itching). Qty: 30 tablet, Refills: 0      CONTINUE these medications which have NOT CHANGED   Details  albuterol (PROVENTIL HFA;VENTOLIN HFA) 108 (90 BASE) MCG/ACT inhaler Inhale 1 puff into the lungs every 4 (four) hours as needed.     budesonide-formoterol (SYMBICORT) 80-4.5 MCG/ACT inhaler Inhale 2 puffs into the lungs daily. 2 (two) times daily.    ciprofloxacin (CIPRO) 250 MG tablet Take 250 mg by mouth once a week.  Refills: 5    clonazePAM (KLONOPIN) 1 MG tablet Take 1 mg by mouth 2 (two) times daily as needed.     escitalopram (LEXAPRO) 20 MG tablet Take 20 mg by mouth daily.     furosemide (LASIX) 40 MG tablet TAKE 1 TABLET BY MOUTH TWICE DAILY FOR FLUID    ipratropium-albuterol (DUONEB) 0.5-2.5 (3) MG/3ML SOLN once daily as needed.    loratadine (CLARITIN) 10 MG tablet Take 10 mg by mouth daily.     omeprazole (PRILOSEC) 40 MG capsule Take 40 mg by mouth daily.     Oxycodone HCl 10  MG TABS Take 10 mg by mouth 3 (three) times daily as needed.  Refills: 0    Potassium Chloride ER 20 MEQ TBCR Take 20 mEq by mouth daily.  Refills: 1    spironolactone (ALDACTONE) 100 MG tablet TAKE 1 TABLET BY MOUTH EVERY DAY         DISCHARGE INSTRUCTIONS:   If you experience worsening of your admission symptoms, develop shortness of breath, life threatening emergency, suicidal or homicidal thoughts you must seek medical attention immediately by calling 911 or calling your MD immediately  if symptoms less severe.  You Must read complete instructions/literature along with all the possible adverse reactions/side effects  for all the Medicines you take and that have been prescribed to you. Take any new Medicines after you have completely understood and accept all the possible adverse reactions/side effects.   Please note  You were cared for by a hospitalist during your hospital stay. If you have any questions about your discharge medications or the care you received while you were in the hospital after you are discharged, you can call the unit and asked to speak with the hospitalist on call if the hospitalist that took care of you is not available. Once you are discharged, your primary care physician will handle any further medical issues. Please note that NO REFILLS for any discharge medications will be authorized once you are discharged, as it is imperative that you return to your primary care physician (or establish a relationship with a primary care physician if you do not have one) for your aftercare needs so that they can reassess your need for medications and monitor your lab values.    Today   CHIEF COMPLAINT:   Chief Complaint  Patient presents with  . Diarrhea    HISTORY OF PRESENT ILLNESS:  HISTORY OF PRESENT ILLNESS: Susan [name] is a 44 y.o. female with a known history of Liver cirrhosis, COPD, Hyperlipidemia- had diarrhea for last 2 weeks have diarrhea- but since yesterday- noted blood on tissues papers on wiping. Also have diffuse abdominal pain- 7-8/10. Feels little better after getting injection in ER. No similar complains in past. No vomiting. No fever.  In ER- Lipase mild high, CT abd- without ac findings.   VITAL SIGNS:  Blood pressure 108/82, pulse 69, temperature 98.3 F (36.8 C), temperature source Oral, resp. rate 16, height 5\' 2"  (1.575 m), weight 72.576 kg (160 lb), SpO2 98 %.  I/O:   Intake/Output Summary (Last 24 hours) at 12/12/14 1320 Last data filed at 12/12/14 0900  Gross per 24 hour  Intake    240 ml  Output      0 ml  Net    240 ml    PHYSICAL EXAMINATION:   GENERAL:  44 y.o.-year-old patient lying in the bed with no acute distress, walking in hall earlier this morning LUNGS: Normal breath sounds bilaterally, no wheezing, rales,rhonchi or crepitation. No use of accessory muscles of respiration.  CARDIOVASCULAR: S1, S2 normal. No murmurs, rubs, or gallops.  ABDOMEN: Soft, non-tender, non-distended. Bowel sounds present. No organomegaly or mass.  EXTREMITIES: No pedal edema, cyanosis, or clubbing.  NEUROLOGIC: Cranial nerves II through XII are intact. Muscle strength 5/5 in all extremities. Sensation intact. Gait not checked.  PSYCHIATRIC: The patient is alert and oriented x 3.  SKIN: No obvious rash, lesion, or ulcer.   DATA REVIEW:   CBC  Recent Labs Lab 12/10/14 0241  12/12/14 0916  WBC 7.8  --   --  HGB 14.4  14.4  < > 13.4  HCT 42.2  --   --   PLT 147*  --   --   < > = values in this interval not displayed.  Chemistries   Recent Labs Lab 12/09/14 1155 12/10/14 0241  NA 132* 133*  K 4.0 3.8  CL 98* 99*  CO2 23 26  GLUCOSE 101* 99  BUN 7 10  CREATININE 0.55 0.58  CALCIUM 8.7* 9.3  AST 45*  --   ALT 35  --   ALKPHOS 80  --   BILITOT 0.9  --     Cardiac Enzymes No results for input(s): TROPONINI in the last 168 hours.  Microbiology Results  Results for orders placed or performed during the hospital encounter of 12/09/14  C difficile quick scan w PCR reflex [facility])     Status: None   Collection Time: 12/10/14 12:37 AM  Result Value Ref Range Status   C Diff antigen NEGATIVE  Final   C Diff toxin NEGATIVE  Final   C Diff interpretation NEGATIVE  Final    RADIOLOGY:  US Abdomen Limited Ruq  12/12/2014   CLINICAL DATA:  Alcoholic cirrhosis of the liver.  EXAM: US ABDOMEN LIMITED - RIGHT UPPER QUADRANT  COMPARISON:  Abdomen CT dated 12/09/2014.  FINDINGS: Gallbladder:  No gallstones or wall thickening visualized. No sonographic Murphy sign noted.  Common bile duct:  Diameter: 2.9 mm  Liver:  Nodular contours and  mildly heterogeneous.  IMPRESSION: Mild changes of cirrhosis of the liver.  No ascites.   Electronically Signed   By: Beckie Salts M.D.   On: 12/12/2014 09:16    EKG:   Orders placed or performed during the hospital encounter of 12/09/14  . ED EKG  . ED EKG      Management plans discussed with the patient, family and they are in agreement.  CODE STATUS:     Code Status Orders        Start     Ordered   12/09/14 1714  Full code   Continuous     12/09/14 1713      TOTAL TIME TAKING CARE OF THIS PATIENT: 40 minutes.    Elby Showers M.D on 12/12/2014 at 1:20 PM  Between 7am to 6pm - Pager - [phone]  After 6pm go to [email or web address] - password [email or web address] [facility]  [place] [facility]  Office  [phone]  CC: Primary care physician; Luna Fuse, MD

## 2014-12-12 NOTE — Plan of Care (Signed)
Problem: Discharge Progression Outcomes Goal: Discharge plan in place and appropriate Individualization 1. Likes to be called Susan [name] 2. Lives at home with 2 children 3. H/O hypertension, hyperlipidemia, COPD and cirrhosis controlled by meds at home;   4. Adm dx diarrhea; followed MD orders in [facility], review results; monitor pt's stool output Goal: Other Discharge Outcomes/Goals Outcome: Progressing Plan of Care progressing to goals: -C/o generalized all over/back pain, relieved with PRN pain meds -c/o anxiety, improved with PRN antianxiety medication -c/o itching, improved with PRN anti-itch medication. - Pt voiding up to BR.  -NPO for abd US scheduled for today.

## 2014-12-13 LAB — AFP TUMOR MARKER: AFP-Tumor Marker: 3.3 ng/mL (ref 0.0–8.3)

## 2014-12-15 ENCOUNTER — Other Ambulatory Visit: Payer: Medicaid Other

## 2015-04-02 ENCOUNTER — Inpatient Hospital Stay
Admission: EM | Admit: 2015-04-02 | Discharge: 2015-04-07 | DRG: 433 | Disposition: A | Discharge status: Home / Self Care | Payer: Medicaid Other | Attending: Internal Medicine | Admitting: Internal Medicine

## 2015-04-02 ENCOUNTER — Encounter: Payer: Self-pay | Admitting: Emergency Medicine

## 2015-04-02 ENCOUNTER — Emergency Department: Payer: Medicaid Other

## 2015-04-02 DIAGNOSIS — F329 Major depressive disorder, single episode, unspecified: Secondary | ICD-10-CM | POA: Diagnosis present

## 2015-04-02 DIAGNOSIS — Z91040 Latex allergy status: Secondary | ICD-10-CM

## 2015-04-02 DIAGNOSIS — Z7951 Long term (current) use of inhaled steroids: Secondary | ICD-10-CM | POA: Diagnosis not present

## 2015-04-02 DIAGNOSIS — K7030 Alcoholic cirrhosis of liver without ascites: Secondary | ICD-10-CM

## 2015-04-02 DIAGNOSIS — Z79891 Long term (current) use of opiate analgesic: Secondary | ICD-10-CM

## 2015-04-02 DIAGNOSIS — F10239 Alcohol dependence with withdrawal, unspecified: Secondary | ICD-10-CM | POA: Diagnosis present

## 2015-04-02 DIAGNOSIS — F419 Anxiety disorder, unspecified: Secondary | ICD-10-CM | POA: Diagnosis present

## 2015-04-02 DIAGNOSIS — E876 Hypokalemia: Secondary | ICD-10-CM | POA: Diagnosis not present

## 2015-04-02 DIAGNOSIS — K219 Gastro-esophageal reflux disease without esophagitis: Secondary | ICD-10-CM | POA: Diagnosis present

## 2015-04-02 DIAGNOSIS — I10 Essential (primary) hypertension: Secondary | ICD-10-CM | POA: Diagnosis present

## 2015-04-02 DIAGNOSIS — R740 Nonspecific elevation of levels of transaminase and lactic acid dehydrogenase [LDH]: Secondary | ICD-10-CM | POA: Diagnosis present

## 2015-04-02 DIAGNOSIS — J449 Chronic obstructive pulmonary disease, unspecified: Secondary | ICD-10-CM | POA: Diagnosis present

## 2015-04-02 DIAGNOSIS — Z79899 Other long term (current) drug therapy: Secondary | ICD-10-CM | POA: Diagnosis not present

## 2015-04-02 DIAGNOSIS — F1020 Alcohol dependence, uncomplicated: Secondary | ICD-10-CM

## 2015-04-02 DIAGNOSIS — F17210 Nicotine dependence, cigarettes, uncomplicated: Secondary | ICD-10-CM | POA: Diagnosis present

## 2015-04-02 DIAGNOSIS — E785 Hyperlipidemia, unspecified: Secondary | ICD-10-CM | POA: Diagnosis present

## 2015-04-02 DIAGNOSIS — K7010 Alcoholic hepatitis without ascites: Secondary | ICD-10-CM | POA: Diagnosis present

## 2015-04-02 DIAGNOSIS — R7989 Other specified abnormal findings of blood chemistry: Secondary | ICD-10-CM | POA: Diagnosis present

## 2015-04-02 DIAGNOSIS — R1084 Generalized abdominal pain: Secondary | ICD-10-CM | POA: Diagnosis present

## 2015-04-02 DIAGNOSIS — R945 Abnormal results of liver function studies: Secondary | ICD-10-CM

## 2015-04-02 DIAGNOSIS — R197 Diarrhea, unspecified: Secondary | ICD-10-CM | POA: Diagnosis present

## 2015-04-02 DIAGNOSIS — K766 Portal hypertension: Secondary | ICD-10-CM | POA: Diagnosis present

## 2015-04-02 DIAGNOSIS — Z888 Allergy status to other drugs, medicaments and biological substances status: Secondary | ICD-10-CM | POA: Diagnosis not present

## 2015-04-02 DIAGNOSIS — Z881 Allergy status to other antibiotic agents status: Secondary | ICD-10-CM | POA: Diagnosis not present

## 2015-04-02 DIAGNOSIS — R748 Abnormal levels of other serum enzymes: Secondary | ICD-10-CM

## 2015-04-02 DIAGNOSIS — R7401 Elevation of levels of liver transaminase levels: Secondary | ICD-10-CM

## 2015-04-02 LAB — ACETAMINOPHEN LEVEL

## 2015-04-02 LAB — URINALYSIS COMPLETE WITH MICROSCOPIC (ARMC ONLY)
BACTERIA UA: NONE SEEN
Bilirubin Urine: NEGATIVE
GLUCOSE, UA: NEGATIVE mg/dL
HGB URINE DIPSTICK: NEGATIVE
KETONES UR: NEGATIVE mg/dL
LEUKOCYTES UA: NEGATIVE
NITRITE: NEGATIVE
PROTEIN: NEGATIVE mg/dL
RBC / HPF: NONE SEEN RBC/hpf (ref 0–5)
SPECIFIC GRAVITY, URINE: 1.003 — AB (ref 1.005–1.030)
Squamous Epithelial / LPF: NONE SEEN
WBC UA: NONE SEEN WBC/hpf (ref 0–5)
pH: 6 (ref 5.0–8.0)

## 2015-04-02 LAB — CBC WITH DIFFERENTIAL/PLATELET
Basophils Absolute: 0.1 10*3/uL (ref 0–0.1)
Basophils Relative: 1 %
EOS PCT: 8 %
Eosinophils Absolute: 0.7 10*3/uL (ref 0–0.7)
HCT: 47.9 % — ABNORMAL HIGH (ref 35.0–47.0)
HEMOGLOBIN: 16.2 g/dL — AB (ref 12.0–16.0)
LYMPHS ABS: 3 10*3/uL (ref 1.0–3.6)
Lymphocytes Relative: 34 %
MCH: 32.9 pg (ref 26.0–34.0)
MCHC: 33.8 g/dL (ref 32.0–36.0)
MCV: 97.4 fL (ref 80.0–100.0)
Monocytes Absolute: 0.9 10*3/uL (ref 0.2–0.9)
Monocytes Relative: 11 %
Neutro Abs: 4.1 10*3/uL (ref 1.4–6.5)
Neutrophils Relative %: 46 %
PLATELETS: 203 10*3/uL (ref 150–440)
RBC: 4.92 MIL/uL (ref 3.80–5.20)
RDW: 13.1 % (ref 11.5–14.5)
WBC: 8.7 10*3/uL (ref 3.6–11.0)

## 2015-04-02 LAB — COMPREHENSIVE METABOLIC PANEL
ALK PHOS: 248 U/L — AB (ref 38–126)
ALT: 777 U/L — AB (ref 14–54)
ANION GAP: 14 (ref 5–15)
AST: 813 U/L — ABNORMAL HIGH (ref 15–41)
Albumin: 4.6 g/dL (ref 3.5–5.0)
BUN: 6 mg/dL (ref 6–20)
CO2: 24 mmol/L (ref 22–32)
CREATININE: 0.64 mg/dL (ref 0.44–1.00)
Calcium: 9.1 mg/dL (ref 8.9–10.3)
Chloride: 95 mmol/L — ABNORMAL LOW (ref 101–111)
GFR calc Af Amer: >60 mL/min (ref >60)
Glucose, Bld: 120 mg/dL — ABNORMAL HIGH (ref 65–99)
Potassium: 3.8 mmol/L (ref 3.5–5.1)
Sodium: 133 mmol/L — ABNORMAL LOW (ref 135–145)
Total Bilirubin: 1.7 mg/dL — ABNORMAL HIGH (ref 0.3–1.2)
Total Protein: 8.5 g/dL — ABNORMAL HIGH (ref 6.5–8.1)

## 2015-04-02 LAB — AMMONIA: Ammonia: 51 umol/L — ABNORMAL HIGH (ref 9–35)

## 2015-04-02 LAB — PROTIME-INR
INR: 0.85
Prothrombin Time: 11.8 seconds (ref 11.4–15.0)

## 2015-04-02 LAB — LIPASE, BLOOD: LIPASE: 20 U/L — AB (ref 22–51)

## 2015-04-02 LAB — ETHANOL: Alcohol, Ethyl (B): 85 mg/dL — ABNORMAL HIGH (ref <5)

## 2015-04-02 MED ORDER — LORAZEPAM 2 MG PO TABS
0.0000 mg | ORAL_TABLET | Freq: Two times a day (BID) | ORAL | Status: AC
Start: 2015-04-04 — End: 2015-04-06
  Administered 2015-04-05: 1 mg via ORAL
  Administered 2015-04-05: 2 mg via ORAL
  Filled 2015-04-02 (×2): qty 1

## 2015-04-02 MED ORDER — CIPROFLOXACIN HCL 250 MG PO TABS
250.0000 mg | ORAL_TABLET | ORAL | Status: DC
  Administered 2015-04-02: 250 mg via ORAL
  Filled 2015-04-02: qty 1

## 2015-04-02 MED ORDER — MORPHINE SULFATE (PF) 4 MG/ML IV SOLN
4.0000 mg | Freq: Once | INTRAVENOUS | Status: AC
  Administered 2015-04-02: 4 mg via INTRAVENOUS
  Filled 2015-04-02: qty 1

## 2015-04-02 MED ORDER — CLONAZEPAM 1 MG PO TABS
1.0000 mg | ORAL_TABLET | Freq: Two times a day (BID) | ORAL | Status: DC | PRN
  Administered 2015-04-02 – 2015-04-05 (×6): 1 mg via ORAL
  Filled 2015-04-02 (×6): qty 1

## 2015-04-02 MED ORDER — LORAZEPAM 2 MG/ML IJ SOLN: 1.0000 mg | Freq: Four times a day (QID) | INTRAMUSCULAR | Status: AC | PRN

## 2015-04-02 MED ORDER — SPIRONOLACTONE 25 MG PO TABS
100.0000 mg | ORAL_TABLET | Freq: Every day | ORAL | Status: DC
  Administered 2015-04-03 – 2015-04-06 (×4): 100 mg via ORAL
  Filled 2015-04-02 (×5): qty 4

## 2015-04-02 MED ORDER — VITAMIN B-1 100 MG PO TABS
100.0000 mg | ORAL_TABLET | Freq: Every day | ORAL | Status: DC
  Administered 2015-04-02 – 2015-04-07 (×5): 100 mg via ORAL
  Filled 2015-04-02 (×6): qty 1

## 2015-04-02 MED ORDER — SODIUM CHLORIDE 0.9 % IV SOLN
Freq: Once | INTRAVENOUS | Status: AC
  Administered 2015-04-02: 09:00:00 via INTRAVENOUS

## 2015-04-02 MED ORDER — LORAZEPAM 2 MG PO TABS
0.0000 mg | ORAL_TABLET | Freq: Four times a day (QID) | ORAL | Status: AC
  Administered 2015-04-03 – 2015-04-04 (×3): 1 mg via ORAL

## 2015-04-02 MED ORDER — LORATADINE 10 MG PO TABS
10.0000 mg | ORAL_TABLET | Freq: Every day | ORAL | Status: DC
  Administered 2015-04-03 – 2015-04-07 (×5): 10 mg via ORAL
  Filled 2015-04-02 (×5): qty 1

## 2015-04-02 MED ORDER — NICOTINE 10 MG IN INHA
1.0000 | RESPIRATORY_TRACT | Status: DC | PRN
  Administered 2015-04-02 – 2015-04-07 (×9): 1 via RESPIRATORY_TRACT
  Filled 2015-04-02 (×2): qty 36

## 2015-04-02 MED ORDER — LORAZEPAM 1 MG PO TABS
1.0000 mg | ORAL_TABLET | Freq: Four times a day (QID) | ORAL | Status: AC | PRN
  Administered 2015-04-04 – 2015-04-05 (×3): 1 mg via ORAL
  Filled 2015-04-02 (×6): qty 1

## 2015-04-02 MED ORDER — SENNOSIDES-DOCUSATE SODIUM 8.6-50 MG PO TABS: 1.0000 | ORAL_TABLET | Freq: Every evening | ORAL | Status: DC | PRN

## 2015-04-02 MED ORDER — MORPHINE SULFATE (PF) 4 MG/ML IV SOLN
4.0000 mg | Freq: Once | INTRAVENOUS | Status: AC
  Administered 2015-04-02: 4 mg via INTRAVENOUS

## 2015-04-02 MED ORDER — MORPHINE SULFATE (PF) 4 MG/ML IV SOLN
INTRAVENOUS | Status: AC
  Administered 2015-04-02: 4 mg via INTRAVENOUS
  Filled 2015-04-02: qty 1

## 2015-04-02 MED ORDER — BUDESONIDE-FORMOTEROL FUMARATE 80-4.5 MCG/ACT IN AERO
2.0000 | INHALATION_SPRAY | Freq: Two times a day (BID) | RESPIRATORY_TRACT | Status: DC
  Administered 2015-04-02 – 2015-04-03 (×2): 2 via RESPIRATORY_TRACT
  Filled 2015-04-02: qty 6.9

## 2015-04-02 MED ORDER — ESCITALOPRAM OXALATE 10 MG PO TABS
20.0000 mg | ORAL_TABLET | Freq: Every day | ORAL | Status: DC
  Administered 2015-04-02 – 2015-04-07 (×6): 20 mg via ORAL
  Filled 2015-04-02 (×6): qty 2

## 2015-04-02 MED ORDER — SODIUM CHLORIDE 0.9 % IV SOLN
INTRAVENOUS | Status: DC
  Administered 2015-04-02 – 2015-04-03 (×2): via INTRAVENOUS

## 2015-04-02 MED ORDER — PANTOPRAZOLE SODIUM 40 MG PO TBEC
40.0000 mg | DELAYED_RELEASE_TABLET | Freq: Every day | ORAL | Status: DC
  Administered 2015-04-03 – 2015-04-07 (×5): 40 mg via ORAL
  Filled 2015-04-02 (×5): qty 1

## 2015-04-02 MED ORDER — HEPARIN SODIUM (PORCINE) 5000 UNIT/ML IJ SOLN
5000.0000 [IU] | Freq: Three times a day (TID) | INTRAMUSCULAR | Status: DC
  Administered 2015-04-02 – 2015-04-07 (×14): 5000 [IU] via SUBCUTANEOUS
  Filled 2015-04-02 (×14): qty 1

## 2015-04-02 MED ORDER — NICOTINE 14 MG/24HR TD PT24
14.0000 mg | MEDICATED_PATCH | Freq: Every day | TRANSDERMAL | Status: DC
  Administered 2015-04-02 – 2015-04-07 (×6): 14 mg via TRANSDERMAL
  Filled 2015-04-02 (×6): qty 1

## 2015-04-02 MED ORDER — ONDANSETRON HCL 4 MG/2ML IJ SOLN: 4.0000 mg | Freq: Four times a day (QID) | INTRAMUSCULAR | Status: DC | PRN

## 2015-04-02 MED ORDER — ALUM & MAG HYDROXIDE-SIMETH 200-200-20 MG/5ML PO SUSP: 30.0000 mL | Freq: Four times a day (QID) | ORAL | Status: DC | PRN

## 2015-04-02 MED ORDER — OXYCODONE HCL 5 MG PO TABS
10.0000 mg | ORAL_TABLET | Freq: Three times a day (TID) | ORAL | Status: DC | PRN
  Administered 2015-04-02 – 2015-04-07 (×13): 10 mg via ORAL
  Filled 2015-04-02 (×13): qty 2

## 2015-04-02 MED ORDER — HYDROCODONE-ACETAMINOPHEN 5-325 MG PO TABS
1.0000 | ORAL_TABLET | ORAL | Status: DC | PRN
  Filled 2015-04-02: qty 2

## 2015-04-02 MED ORDER — THIAMINE HCL 100 MG/ML IJ SOLN
100.0000 mg | Freq: Every day | INTRAMUSCULAR | Status: DC
  Administered 2015-04-06: 100 mg via INTRAVENOUS
  Filled 2015-04-02: qty 2

## 2015-04-02 MED ORDER — FOLIC ACID 1 MG PO TABS
1.0000 mg | ORAL_TABLET | Freq: Every day | ORAL | Status: DC
  Administered 2015-04-02 – 2015-04-07 (×6): 1 mg via ORAL
  Filled 2015-04-02 (×6): qty 1

## 2015-04-02 MED ORDER — ONDANSETRON HCL 4 MG PO TABS: 4.0000 mg | ORAL_TABLET | Freq: Four times a day (QID) | ORAL | Status: DC | PRN

## 2015-04-02 MED ORDER — ADULT MULTIVITAMIN W/MINERALS CH
1.0000 | ORAL_TABLET | Freq: Every day | ORAL | Status: DC
  Administered 2015-04-02 – 2015-04-07 (×6): 1 via ORAL
  Filled 2015-04-02 (×6): qty 1

## 2015-04-02 NOTE — ED Notes (Signed)
Gave pt a soda.  

## 2015-04-02 NOTE — ED Notes (Signed)
Pt to ed with c/o upper abd pain intermittent x 1 year.  Pt states last few days much worse and increasing in frequency.  Pt reports hx of cirrhosis.  Reports vomiting of mucus, and diarrhea.

## 2015-04-02 NOTE — Progress Notes (Signed)
Pt requesting for a nicotine patch and smokes 1.5 packs/day. Dr. Cherlynn Kaiser ordered nicotine patch  daily.

## 2015-04-02 NOTE — ED Provider Notes (Signed)
[facility] Emergency Department Provider Note     Time seen: ----------------------------------------- 8:30 AM on 04/02/2015 -----------------------------------------    I have reviewed the triage vital signs and the nursing notes.   HISTORY  Chief Complaint Abdominal Pain    HPI Susan [name] is a 44 y.o. female who presents ER with upper abdominal pain for the last year. Patient states she has history of cirrhosis of liver in the last few days the abdominal pain has been worse with increased frequency. She reports vomiting up some mucus, diarrhea. Patient states she called her GI doctor today who advised her to get an ultrasound or CT of her abdomen. She denies any fever or chills.     Past Medical History  Diagnosis Date  . Hypertension   . Cirrhosis   . Hyperlipidemia   . COPD (chronic obstructive pulmonary disease) 12/09/2014  . Allergy   . Anemia   . Anxiety   . Asthma   . Depression   . Emphysema of lung   . GERD (gastroesophageal reflux disease)   . Heart murmur     Patient Active Problem List   Diagnosis Date Noted  . Hepatic cirrhosis 12/12/2014  . Diarrhea 12/09/2014  . Blood in stool 12/09/2014  . Abdominal pain 12/09/2014  . COPD (chronic obstructive pulmonary disease) 12/09/2014    Past Surgical History  Procedure Laterality Date  . Cesarean section  2009    Allergies Doxycycline; Latex; Promethazine; Ranitidine; and Zofran  Social History Social History  Substance Use Topics  . Smoking status: Current Every Day Smoker -- 1.00 packs/day for 20 years    Types: Cigarettes  . Smokeless tobacco: None  . Alcohol Use: No     Comment: Quit drinking few years ago.    Review of Systems Constitutional: Negative for fever. Eyes: Negative for visual changes. ENT: Negative for sore throat. Cardiovascular: Negative for chest pain. Respiratory: Negative for shortness of breath. Gastrointestinal: Positive for abdominal  pain, vomiting and diarrhea Genitourinary: Negative for dysuria. Musculoskeletal: Negative for back pain. Skin: Negative for rash. Neurological: Negative for headaches, positive for weakness  10-point ROS otherwise negative.  ____________________________________________   PHYSICAL EXAM:  VITAL SIGNS: ED Triage Vitals  Enc Vitals Group     BP 04/02/15 0822 140/80 mmHg     Pulse Rate 04/02/15 0822 120     Resp 04/02/15 0822 20     Temp 04/02/15 0822 98.2 F (36.8 C)     Temp Source 04/02/15 0822 Oral     SpO2 04/02/15 0822 96 %     Weight 04/02/15 0822 160 lb (72.576 kg)     Height 04/02/15 0822 5\' 3"  (1.6 m)     Head Cir --      Peak Flow --      Pain Score 04/02/15 0823 8     Pain Loc --      Pain Edu? --      Excl. in GC? --     Constitutional: Alert and oriented. Somewhat drowsy, no acute distress.. Eyes: Conjunctivae are normal. PERRL. Normal extraocular movements. ENT   Head: Normocephalic and atraumatic.   Nose: No congestion/rhinnorhea.   Mouth/Throat: Mucous membranes are moist.   Neck: No stridor. Cardiovascular: Normal rate, regular rhythm. Normal and symmetric distal pulses are present in all extremities. No murmurs, rubs, or gallops. Respiratory: Normal respiratory effort without tachypnea nor retractions. Breath sounds are clear and equal bilaterally. No wheezes/rales/rhonchi. Gastrointestinal: Abdomen is distended, mild right upper quadrant  tenderness. Normal bowel sounds. Musculoskeletal: Nontender with normal range of motion in all extremities. No joint effusions.  No lower extremity tenderness nor edema. Neurologic:  Normal speech and language. No gross focal neurologic deficits are appreciated. Speech is normal. No gait instability. Skin:  Skin is warm, dry and intact. No rash noted. Psychiatric: Mood and affect are normal. Speech and behavior are normal. Patient exhibits appropriate insight and  judgment. ____________________________________________  ED COURSE:  Pertinent labs & imaging results that were available during my care of the patient were reviewed by me and considered in my medical decision making (see chart for details). Patient with likely progressive cirrhosis. Patient states she no longer drinks. ____________________________________________    LABS (pertinent positives/negatives)  Labs Reviewed  CBC WITH DIFFERENTIAL/PLATELET - Abnormal; Notable for the following:    Hemoglobin 16.2 (*)    HCT 47.9 (*)    All other components within normal limits  COMPREHENSIVE METABOLIC PANEL - Abnormal; Notable for the following:    Sodium 133 (*)    Chloride 95 (*)    Glucose, Bld 120 (*)    Total Protein 8.5 (*)    AST 813 (*)    ALT 777 (*)    Alkaline Phosphatase 248 (*)    Total Bilirubin 1.7 (*)    All other components within normal limits  LIPASE, BLOOD - Abnormal; Notable for the following:    Lipase 20 (*)    All other components within normal limits  URINALYSIS COMPLETEWITH MICROSCOPIC ([facility] ONLY) - Abnormal; Notable for the following:    Color, Urine STRAW (*)    APPearance CLEAR (*)    Specific Gravity, Urine 1.003 (*)    All other components within normal limits  AMMONIA - Abnormal; Notable for the following:    Ammonia 51 (*)    All other components within normal limits  ETHANOL - Abnormal; Notable for the following:    Alcohol, Ethyl (B) 85 (*)    All other components within normal limits  ACETAMINOPHEN LEVEL - Abnormal; Notable for the following:    Acetaminophen (Tylenol), Serum <10 (*)    All other components within normal limits  PROTIME-INR  HEPATITIS PANEL, ACUTE    RADIOLOGY Images were viewed by me  Abdominal ultrasound IMPRESSION: No acute hepatobiliary abnormality is observed. There are parenchymal changes in the liver compatible with known cirrhosis. ____________________________________________  FINAL ASSESSMENT AND  PLAN  Abdominal pain, vomiting and diarrhea, cirrhosis, chronic alcoholism  Plan: Patient with labs and imaging as dictated above. Patient with market LFT elevation. Acute hepatitis panel was sent off. Case is discussed with Dr. Vilinda Blanks who recommends observation and recheck of her labs with possible liver biopsy. Patient agrees with plan.  Emily Filbert, MD   Emily Filbert, MD 04/02/15 [phone]

## 2015-04-02 NOTE — H&P (Signed)
[facility] - [place] at [facility]   PATIENT NAME: Susan [name]    MR#:  [id number]  DATE OF BIRTH:  05/22/1971  DATE OF ADMISSION:  04/02/2015  PRIMARY CARE PHYSICIAN: Luna Fuse, MD   REQUESTING/REFERRING PHYSICIAN: Dr. Mayford Knife  CHIEF COMPLAINT:  Abdominal pain HISTORY OF PRESENT ILLNESS:  Susan [name]  is a 44 y.o. female with a known history of liver cirrhosis and COPD with ongoing tobacco abuse and alcohol abuse who presents with above complaint. Patient reports over the past 3 days she's had generalized abdominal pain which has increased in frequency. She also reports nausea and vomiting some mucus. She denies diarrhea. She called her GI physician this morning and they recommended that she come to the emergency room for further evaluation. In the emergency room she was noted to have elevated LFTs including AST, ALT and bilirubin. Her abdominal ultrasound shows no evidence of gallstones. She says she drinks 8-9 cans of beer a week.  PAST MEDICAL HISTORY:   Past Medical History  Diagnosis Date  . Hypertension   . Cirrhosis   . Hyperlipidemia   . COPD (chronic obstructive pulmonary disease) 12/09/2014  . Allergy   . Anemia   . Anxiety   . Asthma   . Depression   . Emphysema of lung   . GERD (gastroesophageal reflux disease)   . Heart murmur     PAST SURGICAL HISTORY:   Past Surgical History  Procedure Laterality Date  . Cesarean section  2009    SOCIAL HISTORY:   Social History  Substance Use Topics  . Smoking status: Current Every Day Smoker -- 1.00 packs/day for 20 years    Types: Cigarettes  . Smokeless tobacco: Not on file  . Alcohol Use: No     Comment: Quit drinking few years ago.    FAMILY HISTORY:   Family History  Problem Relation Age of Onset  . Other Mother   . Lung cancer Mother   . Skin cancer Father     DRUG ALLERGIES:   Allergies  Allergen Reactions  . Doxycycline Hives  . Latex Hives  .  Promethazine Diarrhea  . Ranitidine Nausea Only  . Zofran [Ondansetron Hcl] Itching     REVIEW OF SYSTEMS:  CONSTITUTIONAL: No fever, fatigue or weakness.  EYES: No blurred or double vision.  EARS, NOSE, AND THROAT: No tinnitus or ear pain.  RESPIRATORY: No cough, shortness of breath, wheezing or hemoptysis.  CARDIOVASCULAR: No chest pain, orthopnea, edema.  GASTROINTESTINAL: ++ nausea, vomiting, NOdiarrhea ++ right sided abdominal pain.  GENITOURINARY: No dysuria, hematuria.  ENDOCRINE: No polyuria, nocturia,  HEMATOLOGY: No anemia, easy bruising or bleeding SKIN: No rash or lesion. MUSCULOSKELETAL: No joint pain or arthritis.   NEUROLOGIC: No tingling, numbness, weakness.  PSYCHIATRY: ++ anxiety/ depression.   MEDICATIONS AT HOME:   Prior to Admission medications   Medication Sig Start Date End Date Taking? Authorizing Provider  albuterol (PROVENTIL HFA;VENTOLIN HFA) 108 (90 BASE) MCG/ACT inhaler Inhale 1 puff into the lungs every 4 (four) hours as needed.  11/16/14  Yes Historical Provider, MD  budesonide-formoterol (SYMBICORT) 80-4.5 MCG/ACT inhaler Inhale 2 puffs into the lungs daily. 2 (two) times daily. 04/21/14  Yes Historical Provider, MD  ciprofloxacin (CIPRO) 250 MG tablet Take 250 mg by mouth once a week.  11/12/14  Yes Historical Provider, MD  clonazePAM (KLONOPIN) 1 MG tablet Take 1 mg by mouth 2 (two) times daily as needed.    Yes Historical Provider, MD  escitalopram (LEXAPRO) 20 MG tablet Take 20 mg by mouth daily.    Yes Historical Provider, MD  furosemide (LASIX) 40 MG tablet TAKE 1 TABLET BY MOUTH TWICE DAILY FOR FLUID 01/13/14  Yes Historical Provider, MD  ipratropium-albuterol (DUONEB) 0.5-2.5 (3) MG/3ML SOLN once daily as needed. 08/18/14  Yes Historical Provider, MD  loratadine (CLARITIN) 10 MG tablet Take 10 mg by mouth daily.    Yes Historical Provider, MD  omeprazole (PRILOSEC) 40 MG capsule Take 40 mg by mouth daily.    Yes Historical Provider, MD  Oxycodone HCl  10 MG TABS Take 10 mg by mouth 3 (three) times daily as needed.  11/16/14  Yes Historical Provider, MD  Potassium Chloride ER 20 MEQ TBCR Take 40 mEq by mouth daily.  09/28/14  Yes Historical Provider, MD  spironolactone (ALDACTONE) 100 MG tablet TAKE 1 TABLET BY MOUTH EVERY DAY 11/13/14  Yes Historical Provider, MD  hydrocortisone (ANUSOL-HC) 25 MG suppository Place 1 suppository (25 mg total) rectally 2 (two) times daily. 12/12/14   Gale Journey, MD  hydrOXYzine (ATARAX/VISTARIL) 25 MG tablet Take 1 tablet (25 mg total) by mouth 3 (three) times daily as needed (itching). 12/12/14   Gale Journey, MD      VITAL SIGNS:  Blood pressure 104/72, pulse 80, temperature 98.2 F (36.8 C), temperature source Oral, resp. rate 14, height 5\' 3"  (1.6 m), weight 72.576 kg (160 lb), SpO2 96 %.  PHYSICAL EXAMINATION:  GENERAL:  44 y.o.-year-old patient lying in the bed with no acute distress. anxious EYES: Pupils equal, round, reactive to light and accommodation. No scleral icterus. Extraocular muscles intact.  HEENT: Head atraumatic, normocephalic. Oropharynx and nasopharynx clear.  NECK:  Supple, no jugular venous distention. No thyroid enlargement, no tenderness.  LUNGS: Normal breath sounds bilaterally, no wheezing, rales,rhonchi or crepitation. No use of accessory muscles of respiration.  CARDIOVASCULAR: S1, S2 normal. No murmurs, rubs, or gallops.  ABDOMEN: soft, mild RUQ tenderness without guarding rebound. Bowel sounds present. No organomegaly or mass.  EXTREMITIES: No pedal edema, cyanosis, or clubbing.  NEUROLOGIC: Cranial nerves II through XII are grossly intact. No focal deficits. PSYCHIATRIC: The patient is alert and oriented x 3.  SKIN: No obvious rash, lesion, or ulcer.   LABORATORY PANEL:   CBC  Recent Labs Lab 04/02/15 0850  WBC 8.7  HGB 16.2*  HCT 47.9*  PLT 203    ------------------------------------------------------------------------------------------------------------------  Chemistries   Recent Labs Lab 04/02/15 0850  NA 133*  K 3.8  CL 95*  CO2 24  GLUCOSE 120*  BUN 6  CREATININE 0.64  CALCIUM 9.1  AST 813*  ALT 777*  ALKPHOS 248*  BILITOT 1.7*   ------------------------------------------------------------------------------------------------------------------  Cardiac Enzymes No results for input(s): TROPONINI in the last 168 hours. ------------------------------------------------------------------------------------------------------------------  RADIOLOGY:  US Abdomen Limited Ruq  04/02/2015    IMPRESSION: No acute hepatobiliary abnormality is observed. There are parenchymal changes in the liver compatible with known cirrhosis.   Electronically Signed   By: David  [place] M.D.   On: 04/02/2015 09:23    EKG:    IMPRESSION AND PLAN:  44 year old female with history of liver cirrhosis and alcohol and tobacco dependence who presents with abdominal pain and found to have elevated LFTs.   1. Elevated LFTs including transaminitis and elevated bilirubin: INR is normal along with Tylenol level. Abdominal ultrasound shows no acute hepato-biliary abnormality. She may need further investigation with a liver biopsy. GI has been consulted for further evaluation and management. Hepatitis panel was  ordered in the emergency room.   2. Abdominal pain, right side: This is likely secondary to her elevated LFTs. Further recommendations and management as per GI.  3. Liver cirrhosis: Patient will continue on Aldactone and ciprofloxacin.  4. Tobacco dependence: Patient counseled for 3 minutes regarding stopping smoking. She does want nicotine patch which has been ordered.  5.EtOh abuse: Patient will be placed on CIWA protocol   6. Anxiety: Continue clonazepam when necessary.     All the records are reviewed and case discussed with ED  provider. Management plans discussed with the patient and she is in agreement.  CODE STATUS: full  TOTAL TIME TAKING CARE OF THIS PATIENT: 45 minutes.    Kaidan Harpster M.D on 04/02/2015 at 12:31 PM  Between 7am to 6pm - Pager - [phone] After 6pm go to [email or web address] - password [email or web address] [facility]  [place] [facility]  Office  [phone]  CC: Primary care physician; Luna Fuse, MD

## 2015-04-02 NOTE — Consult Note (Signed)
GI Inpatient Consult Note  Reason for Consult: Elevated LFTs   Attending Requesting Consult: Mody  History of Present Illness: Susan [name] is a 44 y.o. female with h/o HTN, hyperlipidemia, COPD, GERD, and cirrhosis admitted  for abdominal pain.  Patient reported abdominal pain 1 year, worsening x 3 days.  Associated symptoms include nausea, vomiting, and diarrhea.  In the ED, RUQ US revealed no acute hepatobiliary abnormality, parenchymal changes in the liver c/w cirrhosis. Labs were significant for AST 813, ALT 777, Alk phos 248, total bilirubin 1.7, total protein 120, lipase 20, ammonia 51, ethanol 85, acetominophen 85.  Hep panel pending.  She was admitted for further evaluation and management.  Today, Susan [name] reports slightly improved abdominal pain and nausea, no further diarrhea.  She has not had further vomiting or diarrhea.  She notes increased bloating over the last few days, but denies other recent liver-related problems like LE swelling, jaundice, itching, confusion, rectal bleeding, and dark stools. She endorses drinking 8-9 beers per week and continues to smoke.  Patient also states her best friend and brother have hepatitis C, she denies risk factors like tattoos, IVDU, or blood transfusions.  She is currently taking Spironolactone 173m and Lasix 496m  She denies additional GI symptoms including fever, chills, dysphagia, reflux, constipation, hematochezia, and melena.  Past Medical History:  Past Medical History  Diagnosis Date  . Hypertension   . Cirrhosis   . Hyperlipidemia   . COPD (chronic obstructive pulmonary disease) 12/09/2014  . Allergy   . Anemia   . Anxiety   . Asthma   . Depression   . Emphysema of lung   . GERD (gastroesophageal reflux disease)   . Heart murmur     Problem List: Patient Active Problem List   Diagnosis Date Noted  . Elevated LFTs 04/02/2015  . Hepatic cirrhosis 12/12/2014  . Diarrhea 12/09/2014  . Blood in stool 12/09/2014  .  Abdominal pain 12/09/2014  . COPD (chronic obstructive pulmonary disease) 12/09/2014    Past Surgical History: Past Surgical History  Procedure Laterality Date  . Cesarean section  2009    Allergies: Allergies  Allergen Reactions  . Doxycycline Hives  . Latex Hives  . Promethazine Diarrhea  . Ranitidine Nausea Only  . Zofran [Ondansetron Hcl] Itching    Home Medications: Prescriptions prior to admission  Medication Sig Dispense Refill Last Dose  . albuterol (PROVENTIL HFA;VENTOLIN HFA) 108 (90 BASE) MCG/ACT inhaler Inhale 1 puff into the lungs every 4 (four) hours as needed.    PRN at PRN  . budesonide-formoterol (SYMBICORT) 80-4.5 MCG/ACT inhaler Inhale 2 puffs into the lungs daily. 2 (two) times daily.   PRN at PRN  . ciprofloxacin (CIPRO) 250 MG tablet Take 250 mg by mouth once a week.   5 04/01/2015 at Unknown time  . clonazePAM (KLONOPIN) 1 MG tablet Take 1 mg by mouth 2 (two) times daily as needed.    04/02/2015 at Unknown time  . escitalopram (LEXAPRO) 20 MG tablet Take 20 mg by mouth daily.    04/01/2015 at Unknown time  . furosemide (LASIX) 40 MG tablet TAKE 1 TABLET BY MOUTH TWICE DAILY FOR FLUID   04/02/2015 at Unknown time  . ipratropium-albuterol (DUONEB) 0.5-2.5 (3) MG/3ML SOLN once daily as needed.   PRN at PRN  . loratadine (CLARITIN) 10 MG tablet Take 10 mg by mouth daily.    04/02/2015 at Unknown time  . omeprazole (PRILOSEC) 40 MG capsule Take 40 mg by mouth daily.  04/02/2015 at Unknown time  . Oxycodone HCl 10 MG TABS Take 10 mg by mouth 3 (three) times daily as needed.   0 04/01/2015 at Unknown time  . Potassium Chloride ER 20 MEQ TBCR Take 40 mEq by mouth daily.   1 04/02/2015 at Unknown time  . spironolactone (ALDACTONE) 100 MG tablet TAKE 1 TABLET BY MOUTH EVERY DAY   04/02/2015 at Unknown time  . hydrocortisone (ANUSOL-HC) 25 MG suppository Place 1 suppository (25 mg total) rectally 2 (two) times daily. 12 suppository 0   . hydrOXYzine (ATARAX/VISTARIL) 25 MG  tablet Take 1 tablet (25 mg total) by mouth 3 (three) times daily as needed (itching). 30 tablet 0    Home medication reconciliation was completed with the patient.   Scheduled Inpatient Medications:   . budesonide-formoterol  2 puff Inhalation BID  . ciprofloxacin  250 mg Oral Weekly  . escitalopram  20 mg Oral Daily  . folic acid  1 mg Oral Daily  . heparin  5,000 Units Subcutaneous 3 times per day  . loratadine  10 mg Oral Daily  . LORazepam  0-4 mg Oral Q6H   Followed by  . [START ON 04/04/2015] LORazepam  0-4 mg Oral Q12H  . multivitamin with minerals  1 tablet Oral Daily  . [START ON 04/03/2015] pantoprazole  40 mg Oral Daily  . [START ON 04/03/2015] spironolactone  100 mg Oral Daily  . thiamine  100 mg Oral Daily   Or  . thiamine  100 mg Intravenous Daily    Continuous Inpatient Infusions:   . sodium chloride      PRN Inpatient Medications:  alum & mag hydroxide-simeth, clonazePAM, HYDROcodone-acetaminophen, LORazepam **OR** LORazepam, nicotine, ondansetron **OR** ondansetron (ZOFRAN) IV, senna-docusate  Family History: family history includes Lung cancer in her mother; Other in her mother; Skin cancer in her father.  The patient's family history is negative for inflammatory bowel disorders, GI malignancy, or solid organ transplantation.  Social History:   reports that she has been smoking Cigarettes.  She has a 20 pack-year smoking history. She does not have any smokeless tobacco history on file. She reports that she does not drink alcohol or use illicit drugs.   Review of Systems: Constitutional: Weight is stable.  Eyes: No changes in vision. ENT: No oral lesions, sore throat, no jaundice.  GI: see HPI.  Heme/Lymph: No easy bruising.  CV: No chest pain.  GU: No hematuria.  Integumentary: No rashes.  Neuro: No headaches.  Psych: No depression/anxiety.  Endocrine: No heat/cold intolerance.  Allergic/Immunologic: No urticaria.  Resp: No SOB: + cough.   Musculoskeletal: No joint swelling.    Physical Examination: BP 121/89 mmHg  Pulse 70  Temp(Src) 98 F (36.7 C) (Oral)  Resp 17  Ht 5' 3"  (1.6 m)  Wt 72.576 kg (160 lb)  BMI 28.35 kg/m2  SpO2 100% Gen: NAD, alert and oriented x 4 HEENT: PEERLA, EOMI, Neck: supple, no JVD or thyromegaly Chest: CTA bilaterally, mild wheezes in lung field bilaterally, crackles, or other adventitious sounds CV: RRR, no m/g/c/r Abd: soft, NT, +TTP RUQ >> epigastric and RLQ, +BS in all four quadrants; no HSM, guarding, ridigity, or rebound tenderness Ext: no edema, well perfused with 2+ pulses, Skin: no rash or lesions noted Lymph: no LAD  Data: Lab Results  Component Value Date   WBC 8.7 04/02/2015   HGB 16.2* 04/02/2015   HCT 47.9* 04/02/2015   MCV 97.4 04/02/2015   PLT 203 04/02/2015    Recent Labs  Lab 04/02/15 0850  HGB 16.2*   Lab Results  Component Value Date   NA 133* 04/02/2015   K 3.8 04/02/2015   CL 95* 04/02/2015   CO2 24 04/02/2015   BUN 6 04/02/2015   CREATININE 0.64 04/02/2015   Lab Results  Component Value Date   ALT 777* 04/02/2015   AST 813* 04/02/2015   ALKPHOS 248* 04/02/2015   BILITOT 1.7* 04/02/2015    Recent Labs Lab 04/02/15 0850  INR 0.85   Assessment/Plan: Ms. Boltz is a 44 y.o. female with h/o HTN, hyperlipidemia, COPD, GERD, and cirrhosis admitted  for abdominal pain.  She reports pain and nausea are improving with pain medications, no further vomiting or diarrhea.  Patient notes she continues to drink 8-9 beers per week despite cirrhosis.  LFTs are significantly elevated, which is atypical for cirrhosis.  I will order a full serologic workup in addition to hepatitis panel already pending. I also recommend liver US with dopplers to r/o portal vein thrombosis or hepatic vein thrombosis.  Recommendations: 1. Elevated LFTs, h/o cirrhosis - Liver US w/ doppler - Serologic work-up ordered, labs drawn tomorrow morning - Continue Lasix 48m and  Spironolactone 1090m - Continue low salt diet  Thank you for the consult. We will follow along with you. Please call with questions or concerns.  MiLavera GuisePA-C [facility]The [facility] Phone: [phone]: [phone]

## 2015-04-02 NOTE — Progress Notes (Signed)
Agree with Susan [name]'s a/p.   Significant elevation in AST and ALT with spared t.bili and INR.   ? ETOH hepatitis.  Does not meet criteria for prednisolone.    Recs:  - liver u/s with dopplers - serologic w/u to eval for other causes of transaminitis other than ETOH hepatitis.  - monitor INR, lft daily.  - no sedating meds.

## 2015-04-03 LAB — COMPREHENSIVE METABOLIC PANEL
ALT: 572 U/L — AB (ref 14–54)
AST: 570 U/L — ABNORMAL HIGH (ref 15–41)
Albumin: 3.5 g/dL (ref 3.5–5.0)
Alkaline Phosphatase: 193 U/L — ABNORMAL HIGH (ref 38–126)
Anion gap: 8 (ref 5–15)
BUN: 8 mg/dL (ref 6–20)
CHLORIDE: 102 mmol/L (ref 101–111)
CO2: 27 mmol/L (ref 22–32)
CREATININE: 0.55 mg/dL (ref 0.44–1.00)
Calcium: 8.9 mg/dL (ref 8.9–10.3)
Glucose, Bld: 97 mg/dL (ref 65–99)
Potassium: 3.7 mmol/L (ref 3.5–5.1)
Sodium: 137 mmol/L (ref 135–145)
Total Bilirubin: 2.1 mg/dL — ABNORMAL HIGH (ref 0.3–1.2)
Total Protein: 6.6 g/dL (ref 6.5–8.1)

## 2015-04-03 LAB — IRON AND TIBC
IRON: 198 ug/dL — AB (ref 28–170)
SATURATION RATIOS: 55 % — AB (ref 10.4–31.8)
TIBC: 357 ug/dL (ref 250–450)
UIBC: 159 ug/dL

## 2015-04-03 LAB — HEPATITIS PANEL, ACUTE
HCV AB: 7.3 {s_co_ratio} — AB (ref 0.0–0.9)
HEP A IGM: NEGATIVE
HEP B C IGM: NEGATIVE
Hepatitis B Surface Ag: NEGATIVE

## 2015-04-03 LAB — CBC
HCT: 41.2 % (ref 35.0–47.0)
Hemoglobin: 13.7 g/dL (ref 12.0–16.0)
MCH: 32.7 pg (ref 26.0–34.0)
MCHC: 33.2 g/dL (ref 32.0–36.0)
MCV: 98.6 fL (ref 80.0–100.0)
PLATELETS: 129 10*3/uL — AB (ref 150–440)
RBC: 4.17 MIL/uL (ref 3.80–5.20)
RDW: 12.8 % (ref 11.5–14.5)
WBC: 4.6 10*3/uL (ref 3.6–11.0)

## 2015-04-03 LAB — FERRITIN: FERRITIN: 415 ng/mL — AB (ref 11–307)

## 2015-04-03 MED ORDER — FUROSEMIDE 40 MG PO TABS
40.0000 mg | ORAL_TABLET | Freq: Two times a day (BID) | ORAL | Status: DC
  Administered 2015-04-04 – 2015-04-07 (×7): 40 mg via ORAL
  Filled 2015-04-03 (×7): qty 1

## 2015-04-03 MED ORDER — HYDROXYZINE HCL 10 MG/5ML PO SYRP
10.0000 mg | ORAL_SOLUTION | Freq: Three times a day (TID) | ORAL | Status: DC | PRN
  Filled 2015-04-03: qty 5

## 2015-04-03 MED ORDER — BUDESONIDE-FORMOTEROL FUMARATE 160-4.5 MCG/ACT IN AERO
2.0000 | INHALATION_SPRAY | Freq: Two times a day (BID) | RESPIRATORY_TRACT | Status: DC
  Administered 2015-04-03 – 2015-04-07 (×8): 2 via RESPIRATORY_TRACT
  Filled 2015-04-03: qty 6

## 2015-04-03 MED ORDER — HYDROXYZINE HCL 10 MG PO TABS
10.0000 mg | ORAL_TABLET | Freq: Three times a day (TID) | ORAL | Status: DC | PRN
  Administered 2015-04-03 – 2015-04-07 (×11): 10 mg via ORAL
  Filled 2015-04-03 (×11): qty 1

## 2015-04-03 MED ORDER — IPRATROPIUM-ALBUTEROL 0.5-2.5 (3) MG/3ML IN SOLN
3.0000 mL | Freq: Four times a day (QID) | RESPIRATORY_TRACT | Status: DC
  Administered 2015-04-03 – 2015-04-07 (×12): 3 mL via RESPIRATORY_TRACT
  Filled 2015-04-03 (×13): qty 3

## 2015-04-03 NOTE — Progress Notes (Signed)
[facility] - [place] at [facility]   PATIENT NAME: Susan [name]    MR#:  [id number]  DATE OF BIRTH:  [birth year]  SUBJECTIVE: Seen today, patient complains of itching, abdominal pain requesting more pain medications. Ulcer has some cough and wheezing. requesting detox medicine for alcohol.   CHIEF COMPLAINT:   Chief Complaint  Patient presents with  . Abdominal Pain    REVIEW OF SYSTEMS:    Review of Systems  Constitutional: Negative for fever and chills.  HENT: Negative for hearing loss.   Eyes: Negative for blurred vision, double vision and photophobia.  Respiratory: Positive for cough and wheezing. Negative for hemoptysis and shortness of breath.   Cardiovascular: Negative for palpitations, orthopnea and leg swelling.  Gastrointestinal: Positive for abdominal pain. Negative for vomiting and diarrhea.  Genitourinary: Negative for dysuria and urgency.  Musculoskeletal: Negative for myalgias and neck pain.  Skin: Negative for rash.  Neurological: Negative for dizziness, focal weakness, seizures, weakness and headaches.  Psychiatric/Behavioral: Negative for memory loss. The patient does not have insomnia.     Nutrition:  Tolerating Diet: Tolerating PT:      DRUG ALLERGIES:   Allergies  Allergen Reactions  . Doxycycline Hives  . Latex Hives  . Promethazine Diarrhea  . Ranitidine Nausea Only  . Zofran [Ondansetron Hcl] Itching    VITALS:  Blood pressure 101/50, pulse 49, temperature 98.4 F (36.9 C), temperature source Oral, resp. rate 20, height  (1.6 m), weight 72.576 kg (160 lb), SpO2 100 %.  PHYSICAL EXAMINATION:   Physical Exam  GENERAL:  44 y.o.-year-old patient lying in the bed with no acute distress.  EYES: Pupils equal, round, reactive to light and accommodation. No scleral icterus. Extraocular muscles intact.  HEENT: Head atraumatic, normocephalic. Oropharynx and nasopharynx clear.  NECK:  Supple, no jugular venous  distention. No thyroid enlargement, no tenderness.  LUNGS: Bilateral expiratory wheeze in all lung fields. CARDIOVASCULAR: S1, S2 normal. No murmurs, rubs, or gallops.  ABDOMEN: Mild right upper quadrant tenderness present, no rebound tenderness. EXTREMITIES: No pedal edema, cyanosis, or clubbing.  NEUROLOGIC: Cranial nerves II through XII are intact. Muscle strength 5/5 in all extremities. Sensation intact. Gait not checked.  PSYCHIATRIC: The patient is alert and oriented x 3.  SKIN: No obvious rash, lesion, or ulcer.    LABORATORY PANEL:   CBC  Recent Labs Lab 04/03/15 0457  WBC 4.6  HGB 13.7  HCT 41.2  PLT 129*   ------------------------------------------------------------------------------------------------------------------  Chemistries   Recent Labs Lab 04/03/15 0457  NA 137  K 3.7  CL 102  CO2 27  GLUCOSE 97  BUN 8  CREATININE 0.55  CALCIUM 8.9  AST 570*  ALT 572*  ALKPHOS 193*  BILITOT 2.1*   ------------------------------------------------------------------------------------------------------------------  Cardiac Enzymes No results for input(s): TROPONINI in the last 168 hours. ------------------------------------------------------------------------------------------------------------------  RADIOLOGY:  US Abdomen Limited Ruq  04/02/2015   CLINICAL DATA:  Right upper quadrant discomfort intermittently for the past year, increased symptoms for the past few days, history of cirrhosis.  EXAM: US ABDOMEN LIMITED - RIGHT UPPER QUADRANT  COMPARISON:  Abdominal pelvic CT scan of Dec 09, 2014  FINDINGS: Gallbladder:  No gallstones or wall thickening visualized. No sonographic Murphy sign noted.  Common bile duct:  Diameter: 6 mm  Liver:  The hepatic echotexture is heterogeneous. The surface contour of the liver is mildly nodular there is no focal mass nor intrahepatic ductal dilation.  IMPRESSION: No acute hepatobiliary abnormality is observed. There are parenchymal  changes in the liver compatible with known cirrhosis.   Electronically Signed   By: David  [place] M.D.   On: 04/02/2015 09:23     ASSESSMENT AND PLAN:   Active Problems:   Elevated LFTs  1 elevated LFTs secondary to alcoholic hepatitis. LFTs are better today. GI is following. Ultrasound of abdomen negative for portal vein thrombosis. Discontinue IV fluids. Not a candidate for prednisone as per GI. #2 alcoholic liver cirrhosis: With portal hypertension Follows up with Dr. Deliah Goody; as an outpatient. Her abdominal pain is better. Patient is on Cipro once a week for SBP prophylaxis. Restart Lasix 40 mg twice a day, Aldactone 100 MG every day. Advised  to abstain from alcohol. #3 itching secondary to cirrhosis continue Atarax. #4 ; COPD does have some wheezing restart the DuoNeb's, Symbicort. #5 history of depression continue Lexapro    All the records are reviewed and case discussed with Care Management/Social Workerr. Management plans discussed with the patient, family and they are in agreement.  CODE STATUS: full  TOTAL TIME TAKING CARE OF THIS PATIENT: 35 minutes.   POSSIBLE D/C IN 1-2 DAYS, DEPENDING ON CLINICAL CONDITION.   Katha Hamming M.D on 04/03/2015 at 11:27 AM  Between 7am to 6pm - Pager - [phone]  After 6pm go to [email or web address] - password [email or web address] [facility]  [facility] [place] [facility]  Office  [phone]  CC: Primary care physician; Luna Fuse, MD

## 2015-04-03 NOTE — Progress Notes (Signed)
GI Inpatient Follow-up Note  Patient Identification: SANAH KRASKA is a 44 y.o. female with cirrhosis, a/w elevated liver enzymes  Subjective:  Abd pain improved. No f/c, n/v, jaundice, abd swelling, LE Swelling, bleeding.   Scheduled Inpatient Medications:  . budesonide-formoterol  2 puff Inhalation BID  . ciprofloxacin  250 mg Oral Weekly  . escitalopram  20 mg Oral Daily  . folic acid  1 mg Oral Daily  . furosemide  40 mg Oral BID  . heparin  5,000 Units Subcutaneous 3 times per day  . ipratropium-albuterol  3 mL Nebulization Q6H  . loratadine  10 mg Oral Daily  . LORazepam  0-4 mg Oral Q6H   Followed by  . [START ON 04/04/2015] LORazepam  0-4 mg Oral Q12H  . multivitamin with minerals  1 tablet Oral Daily  . nicotine  14 mg Transdermal Daily  . pantoprazole  40 mg Oral Daily  . spironolactone  100 mg Oral Daily  . thiamine  100 mg Oral Daily   Or  . thiamine  100 mg Intravenous Daily    Continuous Inpatient Infusions:     PRN Inpatient Medications:  alum & mag hydroxide-simeth, clonazePAM, hydrOXYzine, LORazepam **OR** LORazepam, nicotine, ondansetron **OR** ondansetron (ZOFRAN) IV, oxyCODONE, senna-docusate     Physical Examination: BP 133/49 mmHg  Pulse 88  Temp(Src) 97.9 F (36.6 C) (Oral)  Resp 18  Ht  (1.6 m)  Wt 72.576 kg (160 lb)  BMI 28.35 kg/m2  SpO2 96% Gen: NAD, alert and oriented x 4  Neck: supple, no JVD or thyromegaly Chest: CTA bilaterally, no wheezes, crackles, or other adventitious sounds CV: RRR, no m/g/c/r Abd: soft, NT, ND, +BS in all four quadrants; no HSM, guarding, ridigity, or rebound tenderness, + obese abdomen Ext: no edema, well perfused with 2+ pulses, Skin: no rash or lesions noted Lymph: no LAD , Data: Lab Results  Component Value Date   WBC 4.6 04/03/2015   HGB 13.7 04/03/2015   HCT 41.2 04/03/2015   MCV 98.6 04/03/2015   PLT 129* 04/03/2015    Recent Labs Lab 04/02/15 0850 04/03/15 0457  HGB 16.2* 13.7    Lab Results  Component Value Date   NA 137 04/03/2015   K 3.7 04/03/2015   CL 102 04/03/2015   CO2 27 04/03/2015   BUN 8 04/03/2015   CREATININE 0.55 04/03/2015   Lab Results  Component Value Date   ALT 572* 04/03/2015   AST 570* 04/03/2015   ALKPHOS 193* 04/03/2015   BILITOT 2.1* 04/03/2015    Recent Labs Lab 04/02/15 0850  INR 0.85   Assessment/Plan:  Ms. Mcjunkins is a 44 y.o. female with cirrhois, now with acute worsening in liver enzymes.  ? ETOH Hepatitis.  AST and ALT down today, t. Bili up slighlty.  W/u so far negative for other causes.    Recommendations: - daily LFT, INR - Maddrey discriminant func well less than 32 so will not treat with prednisolone - ETOH avoidance - hepC management as outpatient.   Please call with questions or concerns.  Idabelle Mcpeters, Addison Naegeli, MD

## 2015-04-04 LAB — COMPREHENSIVE METABOLIC PANEL
ALBUMIN: 3.7 g/dL (ref 3.5–5.0)
ALK PHOS: 222 U/L — AB (ref 38–126)
ALT: 611 U/L — ABNORMAL HIGH (ref 14–54)
ALT: 595 U/L — AB (ref 14–54)
ANION GAP: 9 (ref 5–15)
AST: 594 U/L — AB (ref 15–41)
AST: 567 U/L — AB (ref 15–41)
Albumin: 3.5 g/dL (ref 3.5–5.0)
Alkaline Phosphatase: 191 U/L — ABNORMAL HIGH (ref 38–126)
Anion gap: 11 (ref 5–15)
BILIRUBIN TOTAL: 1.6 mg/dL — AB (ref 0.3–1.2)
BILIRUBIN TOTAL: 2 mg/dL — AB (ref 0.3–1.2)
BUN: 5 mg/dL — AB (ref 6–20)
BUN: 6 mg/dL (ref 6–20)
CALCIUM: 8.9 mg/dL (ref 8.9–10.3)
CHLORIDE: 102 mmol/L (ref 101–111)
CO2: 27 mmol/L (ref 22–32)
CO2: 26 mmol/L (ref 22–32)
CREATININE: 0.61 mg/dL (ref 0.44–1.00)
Calcium: 9 mg/dL (ref 8.9–10.3)
Chloride: 103 mmol/L (ref 101–111)
Creatinine, Ser: 0.57 mg/dL (ref 0.44–1.00)
GFR calc Af Amer: >60 mL/min (ref >60)
GFR calc non Af Amer: >60 mL/min (ref >60)
GLUCOSE: 97 mg/dL (ref 65–99)
Glucose, Bld: 116 mg/dL — ABNORMAL HIGH (ref 65–99)
Potassium: 2.9 mmol/L — CL (ref 3.5–5.1)
Potassium: 3.1 mmol/L — ABNORMAL LOW (ref 3.5–5.1)
SODIUM: 139 mmol/L (ref 135–145)
Sodium: 139 mmol/L (ref 135–145)
TOTAL PROTEIN: 6.7 g/dL (ref 6.5–8.1)
TOTAL PROTEIN: 6.5 g/dL (ref 6.5–8.1)

## 2015-04-04 MED ORDER — POTASSIUM CHLORIDE CRYS ER 20 MEQ PO TBCR
40.0000 meq | EXTENDED_RELEASE_TABLET | ORAL | Status: AC | PRN
  Administered 2015-04-04 – 2015-04-05 (×2): 40 meq via ORAL
  Filled 2015-04-04 (×2): qty 2

## 2015-04-04 MED ORDER — CIPROFLOXACIN HCL 500 MG PO TABS
500.0000 mg | ORAL_TABLET | Freq: Every day | ORAL | Status: DC
  Administered 2015-04-05 – 2015-04-07 (×3): 500 mg via ORAL
  Filled 2015-04-04 (×3): qty 1

## 2015-04-04 NOTE — Progress Notes (Signed)
Paged Dr. Luberta Mutter about critical potassium level of 2.9. She stated she will put orders in.

## 2015-04-04 NOTE — Progress Notes (Signed)
GI Note:  Ongoing abd pain but LFT trending down. ? Pain from capsule inflammation from hepatitis.  This is likely improving ETOH hepatitis.   Recs: - follow LFT, obtain liver u/s with dopplers if LFT do not continue to trend down in am.

## 2015-04-04 NOTE — Progress Notes (Signed)
Spoke with Barbara Cower from pharmacy about Nicotrol inhaler. Advised to override error message and give pt the inhaler.

## 2015-04-04 NOTE — Progress Notes (Signed)
[facility] [place] - [place] at [facility]   PATIENT NAME: Susan [name]    MR#:  [id number]  DATE OF BIRTH:  1970/10/14  SUBJECTIVE: Today, patient to still has abdominal pain. She says she stopped taking Cipro for the past 2 weeks. No nausea no vomiting. No shortness of breath. During some tremors and going through withdrawal symptoms. Hypokalemia.   CHIEF COMPLAINT:   Chief Complaint  Patient presents with  . Abdominal Pain    REVIEW OF SYSTEMS:    Review of Systems  Constitutional: Negative for fever and chills.  HENT: Negative for hearing loss.   Eyes: Negative for blurred vision, double vision and photophobia.  Respiratory: Negative for cough, hemoptysis and shortness of breath.   Cardiovascular: Negative for palpitations, orthopnea and leg swelling.  Gastrointestinal: Positive for abdominal pain. Negative for vomiting and diarrhea.  Genitourinary: Negative for dysuria and urgency.  Musculoskeletal: Negative for myalgias and neck pain.  Skin: Negative for rash.  Neurological: Positive for tremors. Negative for dizziness, focal weakness, seizures, weakness and headaches.  Psychiatric/Behavioral: Negative for memory loss. The patient does not have insomnia.     Nutrition:  Tolerating Diet: Tolerating PT:      DRUG ALLERGIES:   Allergies  Allergen Reactions  . Doxycycline Hives  . Latex Hives  . Promethazine Diarrhea  . Ranitidine Nausea Only  . Zofran [Ondansetron Hcl] Itching    VITALS:  Blood pressure 114/84, pulse 78, temperature 98 F (36.7 C), temperature source Oral, resp. rate 17, height 5\' 3"  (1.6 m), weight 72.576 kg (160 lb), SpO2 99 %.  PHYSICAL EXAMINATION:   Physical Exam  GENERAL:  44 y.o.-year-old patient lying in the bed with no acute distress.  EYES: Pupils equal, round, reactive to light and accommodation. No scleral icterus. Extraocular muscles intact.  HEENT: Head atraumatic, normocephalic. Oropharynx and nasopharynx  clear.  NECK:  Supple, no jugular venous distention. No thyroid enlargement, no tenderness.  LUNGS: Bilateral expiratory wheeze in all lung fields. CARDIOVASCULAR: S1, S2 normal. No murmurs, rubs, or gallops.  ABDOMEN: Mild right upper quadrant tenderness present, no rebound tenderness. EXTREMITIES: No pedal edema, cyanosis, or clubbing.  NEUROLOGIC: Cranial nerves II through XII are intact. Muscle strength 5/5 in all extremities. Sensation intact. Gait not checked.  PSYCHIATRIC: The patient is alert and oriented x 3.  SKIN: No obvious rash, lesion, or ulcer.    LABORATORY PANEL:   CBC  Recent Labs Lab 04/03/15 0457  WBC 4.6  HGB 13.7  HCT 41.2  PLT 129*   ------------------------------------------------------------------------------------------------------------------  Chemistries   Recent Labs Lab 04/04/15 0946  NA 139  K 2.9*  CL 103  CO2 27  GLUCOSE 97  BUN 5*  CREATININE 0.57  CALCIUM 8.9  AST 594*  ALT 611*  ALKPHOS 222*  BILITOT 1.6*   ------------------------------------------------------------------------------------------------------------------  Cardiac Enzymes No results for input(s): TROPONINI in the last 168 hours. ------------------------------------------------------------------------------------------------------------------  RADIOLOGY:  No results found.   ASSESSMENT AND PLAN:   Active Problems:   Elevated LFTs  1 elevated LFTs secondary to alcoholic hepatitis. LFTs are worse than yesterday.. GI is following. Ultrasound of abdomen negative for portal vein thrombosis. Discontinue IV fluids. Not a candidate for prednisone as per GI. He cause of continued abdominal pain and not taking Cipro as she should ,patient will be started on full output was Cipro for possible SBP. #2 alcoholic liver cirrhosis: With portal hypertension Follows up with Dr. Deliah Goody; as an outpatient. Her abdominal pain is better. Marland Kitchen Restart Lasix  40 mg twice a day,  Aldactone 100 MG every day. Advised  to abstain from alcohol.  #3 itching secondary to cirrhosis continue Atarax. #4 ; COPD does have some wheezing restart the DuoNeb's, Symbicort. #5 history of depression continue Lexapro #5 hypokalemia replace the potassium 6.Alcohol  withdrawal continue  CIWA  Protocol..   All the records are reviewed and case discussed with Care Management/Social Workerr. Management plans discussed with the patient, family and they are in agreement.  CODE STATUS: full  TOTAL TIME TAKING CARE OF THIS PATIENT: 35 minutes.   POSSIBLE D/C IN 1-2 DAYS, DEPENDING ON CLINICAL CONDITION.   Katha Hamming M.D on 04/04/2015 at 11:47 AM  Between 7am to 6pm - Pager - [phone]  After 6pm go to [email or web address] - password [email or web address] [facility]  [facility]  Office  [phone]  CC: Primary care physician; Luna Fuse, MD

## 2015-04-05 ENCOUNTER — Inpatient Hospital Stay: Payer: Medicaid Other

## 2015-04-05 LAB — COMPREHENSIVE METABOLIC PANEL
ALT: 576 U/L — ABNORMAL HIGH (ref 14–54)
ANION GAP: 10 (ref 5–15)
AST: 478 U/L — ABNORMAL HIGH (ref 15–41)
Albumin: 3.8 g/dL (ref 3.5–5.0)
Alkaline Phosphatase: 181 U/L — ABNORMAL HIGH (ref 38–126)
BILIRUBIN TOTAL: 1.3 mg/dL — AB (ref 0.3–1.2)
BUN: 7 mg/dL (ref 6–20)
CHLORIDE: 105 mmol/L (ref 101–111)
CO2: 25 mmol/L (ref 22–32)
Calcium: 9.2 mg/dL (ref 8.9–10.3)
Creatinine, Ser: 0.57 mg/dL (ref 0.44–1.00)
Glucose, Bld: 107 mg/dL — ABNORMAL HIGH (ref 65–99)
POTASSIUM: 3.5 mmol/L (ref 3.5–5.1)
Sodium: 140 mmol/L (ref 135–145)
TOTAL PROTEIN: 7.2 g/dL (ref 6.5–8.1)

## 2015-04-05 LAB — CBC
HEMATOCRIT: 41.4 % (ref 35.0–47.0)
Hemoglobin: 14.1 g/dL (ref 12.0–16.0)
MCH: 33.5 pg (ref 26.0–34.0)
MCHC: 34 g/dL (ref 32.0–36.0)
MCV: 98.5 fL (ref 80.0–100.0)
PLATELETS: 125 10*3/uL — AB (ref 150–440)
RBC: 4.2 MIL/uL (ref 3.80–5.20)
RDW: 13 % (ref 11.5–14.5)
WBC: 6.1 10*3/uL (ref 3.6–11.0)

## 2015-04-05 LAB — ANA W/REFLEX: ANA: NEGATIVE

## 2015-04-05 LAB — AFP TUMOR MARKER: AFP TUMOR MARKER: 3.7 ng/mL (ref 0.0–8.3)

## 2015-04-05 LAB — CERULOPLASMIN: CERULOPLASMIN: 35.5 mg/dL (ref 19.0–39.0)

## 2015-04-05 LAB — MITOCHONDRIAL ANTIBODIES: MITOCHONDRIAL M2 AB, IGG: 5.9 U (ref 0.0–20.0)

## 2015-04-05 LAB — ANTI-SMOOTH MUSCLE ANTIBODY, IGG: F-ACTIN AB IGG: 14 U (ref 0–19)

## 2015-04-05 NOTE — Progress Notes (Signed)
   04/05/15 2000  Clinical Encounter Type  Visited With Patient and family together  Visit Type Spiritual support  Spiritual Encounters  Spiritual Needs Prayer  Stress Factors  Patient Stress Factors Health changes   Status: female 78yrs Elevated LFTs Family: daughter, daughter's school friend, and her mom bedside Faith: [employer] Visit Assessment: The chaplain visited with the family or visitors first and then visited with the patient in a 2nd visit. The other adult female said that she really does not know the patient, they became friends through their elementary school kids that attend [employer]. The patient's daughter will be staying with them tonight. The patient said that she's feeling better but a little sleepy and has been in the hospital since Friday night.  Pastoral Care: [phone] pager or by online request.

## 2015-04-05 NOTE — Progress Notes (Signed)
GI Inpatient Follow-up Note  Patient Identification: Susan [name] is a 44 y.o. female with ETOH cirrhosis a/w elev liver enzymes.   Subjective:  Still same abd pain today.  Diffuse and RUQ.  No n/v, no f/c.   Scheduled Inpatient Medications:  . budesonide-formoterol  2 puff Inhalation BID  . ciprofloxacin  500 mg Oral Daily  . escitalopram  20 mg Oral Daily  . folic acid  1 mg Oral Daily  . furosemide  40 mg Oral BID  . heparin  5,000 Units Subcutaneous 3 times per day  . ipratropium-albuterol  3 mL Nebulization Q6H  . loratadine  10 mg Oral Daily  . LORazepam  0-4 mg Oral Q12H  . multivitamin with minerals  1 tablet Oral Daily  . nicotine  14 mg Transdermal Daily  . pantoprazole  40 mg Oral Daily  . spironolactone  100 mg Oral Daily  . thiamine  100 mg Oral Daily   Or  . thiamine  100 mg Intravenous Daily    Continuous Inpatient Infusions:     PRN Inpatient Medications:  alum & mag hydroxide-simeth, clonazePAM, hydrOXYzine, nicotine, ondansetron **OR** ondansetron (ZOFRAN) IV, oxyCODONE, senna-docusate    Physical Examination: BP 100/70 mmHg  Pulse 91  Temp(Src) 98.7 F (37.1 C) (Oral)  Resp 18  Ht  (1.6 m)  Wt 72.576 kg (160 lb)  BMI 28.35 kg/m2  SpO2 100% Gen: NAD, alert and oriented x 3 Chest: CTA bilaterally, no wheezes, crackles, or other adventitious sounds CV: RRR, no m/g/c/r Abd: + mild RUQ and diffuse TTP,, +BS in all four quadrants; no HSM, guarding, ridigity, or rebound tenderness Ext: no edema, well perfused with 2+ pulses, Skin: no rash or lesions noted Lymph: no LAD  Data: Lab Results  Component Value Date   WBC 6.1 04/05/2015   HGB 14.1 04/05/2015   HCT 41.4 04/05/2015   MCV 98.5 04/05/2015   PLT 125* 04/05/2015    Recent Labs Lab 04/02/15 0850 04/03/15 0457 04/05/15 0637  HGB 16.2* 13.7 14.1   Lab Results  Component Value Date   NA 140 04/05/2015   K 3.5 04/05/2015   CL 105 04/05/2015   CO2 25 04/05/2015   BUN 7  04/05/2015   CREATININE 0.57 04/05/2015   Lab Results  Component Value Date   ALT 576* 04/05/2015   AST 478* 04/05/2015   ALKPHOS 181* 04/05/2015   BILITOT 1.3* 04/05/2015    Recent Labs Lab 04/02/15 0850  INR 0.85   Assessment/Plan: Susan [name] is a 44 y.o. female with ETOH cirrhosis a/w elev liver enzymes.  Still with RUQ pain. Liver enzymes slowly trending down. ? ETOH hepatitis, ? Ingestion.   Recommendations: - liver u/s with dopplers today - not candiate for prednisolone based on Maddrey discr function.   Please call with questions or concerns.  Kelechi Astarita, Addison Naegeli, MD

## 2015-04-06 LAB — COMPREHENSIVE METABOLIC PANEL
ALT: 477 U/L — AB (ref 14–54)
ANION GAP: 9 (ref 5–15)
AST: 339 U/L — ABNORMAL HIGH (ref 15–41)
Albumin: 3.7 g/dL (ref 3.5–5.0)
Alkaline Phosphatase: 186 U/L — ABNORMAL HIGH (ref 38–126)
BUN: 7 mg/dL (ref 6–20)
CALCIUM: 9 mg/dL (ref 8.9–10.3)
CHLORIDE: 105 mmol/L (ref 101–111)
CO2: 26 mmol/L (ref 22–32)
CREATININE: 0.59 mg/dL (ref 0.44–1.00)
Glucose, Bld: 111 mg/dL — ABNORMAL HIGH (ref 65–99)
Potassium: 3.1 mmol/L — ABNORMAL LOW (ref 3.5–5.1)
SODIUM: 140 mmol/L (ref 135–145)
Total Bilirubin: 1.2 mg/dL (ref 0.3–1.2)
Total Protein: 6.8 g/dL (ref 6.5–8.1)

## 2015-04-06 MED ORDER — LORAZEPAM 2 MG/ML IJ SOLN
2.0000 mg | Freq: Four times a day (QID) | INTRAMUSCULAR | Status: DC | PRN
Start: 2015-04-06 — End: 2015-04-07

## 2015-04-06 MED ORDER — LORAZEPAM 2 MG PO TABS
0.0000 mg | ORAL_TABLET | Freq: Two times a day (BID) | ORAL | Status: DC
  Administered 2015-04-06 – 2015-04-07 (×2): 2 mg via ORAL
  Filled 2015-04-06 (×2): qty 1

## 2015-04-06 NOTE — Progress Notes (Signed)
[facility] - [place] at [facility]   PATIENT NAME: Susan [name]    MR#:  [id number]  DATE OF BIRTH:  1971/05/14  SUBJECTIVE: Admitted for alcoholic hepatitis.LFTS  Are trending down.c/o tremors,and requesting more ativan the patient has also has some sweating,tremors,continued on ciwa  protocol/o abdominal pain,no nausea,eating well.suspecting narcotic seeking  Behavior.  CHIEF COMPLAINT:   Chief Complaint  Patient presents with  . Abdominal Pain    REVIEW OF SYSTEMS:    Review of Systems  Constitutional: Negative for fever and chills.  HENT: Negative for hearing loss.   Eyes: Negative for blurred vision, double vision and photophobia.  Respiratory: Negative for cough, hemoptysis and shortness of breath.   Cardiovascular: Negative for palpitations, orthopnea and leg swelling.  Gastrointestinal: Positive for abdominal pain. Negative for vomiting and diarrhea.  Genitourinary: Negative for dysuria and urgency.  Musculoskeletal: Negative for myalgias and neck pain.  Skin: Negative for rash.  Neurological: Positive for tremors. Negative for dizziness, focal weakness, seizures, weakness and headaches.  Psychiatric/Behavioral: Negative for memory loss. The patient does not have insomnia.     Nutrition:  Tolerating Diet: Tolerating PT:      DRUG ALLERGIES:   Allergies  Allergen Reactions  . Doxycycline Hives  . Latex Hives  . Promethazine Diarrhea  . Ranitidine Nausea Only  . Zofran [Ondansetron Hcl] Itching    VITALS:  Blood pressure 105/58, pulse 82, temperature 98.1 F (36.7 C), temperature source Oral, resp. rate 17, height 5\' 3"  (1.6 m), weight 72.576 kg (160 lb), SpO2 100 %.  PHYSICAL EXAMINATION:   Physical Exam  GENERAL:  44 y.o.-year-old patient lying in the bed with no acute distress.  EYES: Pupils equal, round, reactive to light and accommodation. No scleral icterus. Extraocular muscles intact.  HEENT: Head atraumatic,  normocephalic. Oropharynx and nasopharynx clear.  NECK:  Supple, no jugular venous distention. No thyroid enlargement, no tenderness.  LUNGS: Bilateral expiratory wheeze in all lung fields. CARDIOVASCULAR: S1, S2 normal. No murmurs, rubs, or gallops.  ABDOMEN: Mild right upper quadrant tenderness present, no rebound tenderness. EXTREMITIES: No pedal edema, cyanosis, or clubbing.  NEUROLOGIC: Cranial nerves II through XII are intact. Muscle strength 5/5 in all extremities. Sensation intact. Gait not checked.  PSYCHIATRIC: The patient is alert and oriented x 3. Hand   tremor. SKIN: No obvious rash, lesion, or ulcer.    LABORATORY PANEL:   CBC  Recent Labs Lab 04/05/15 0637  WBC 6.1  HGB 14.1  HCT 41.4  PLT 125*   ------------------------------------------------------------------------------------------------------------------  Chemistries   Recent Labs Lab 04/06/15 1015  NA 140  K 3.1*  CL 105  CO2 26  GLUCOSE 111*  BUN 7  CREATININE 0.59  CALCIUM 9.0  AST 339*  ALT 477*  ALKPHOS 186*  BILITOT 1.2   ------------------------------------------------------------------------------------------------------------------  Cardiac Enzymes No results for input(s): TROPONINI in the last 168 hours. ------------------------------------------------------------------------------------------------------------------  RADIOLOGY:  [place] Art/ven Flow Abd Pelv Doppler  04/05/2015   CLINICAL DATA:  Elevated liver enzymes.  EXAM: DUPLEX ULTRASOUND OF LIVER  TECHNIQUE: Color and duplex Doppler ultrasound was performed to evaluate the hepatic in-flow and out-flow vessels.  COMPARISON:  Ultrasound, 04/02/2015.  FINDINGS: Portal Vein Velocities: All hepatopetal.  Main:  52.4 cm/sec proximally, 45.3 cm/sec,  55.5 cm/sec  Right:  20.0 cm/sec  Left:  25.7 cm/sec  Hepatic Vein Velocities: All hepatopetal.  Right:  19.9 cm/sec  Middle:  19.6 cm/sec  Left:  37.4 cm/sec  Hepatic Artery Velocity:  173  cm/sec  Splenic Vein Velocity:  17.3 cm/sec  Varices: Abscess  Ascites: Absent  No portal vein occlusion or thrombus. Spleen borderline enlarged with a volume of 380 mL.  IMPRESSION: 1. Normal Doppler study of the liver with normal directional and normal velocity flow in the portal veins and hepatic veins as well as normal waveforms.   Electronically Signed   By: Amie Portland M.D.   On: 04/05/2015 10:39     ASSESSMENT AND PLAN:   Active Problems:   Elevated LFTs  1 elevated LFTs secondary to alcoholic hepatitis. LFTs  improving.. GI is following. Ultrasound of abdomen negative for portal vein thrombosis. Discontinue IV fluids. Not a candidate for prednisone as per GI.Because of continued abdominal pain and not taking Cipro as she should ,patient  Started omn IV cipro  For possible SBP  #2 alcoholic liver cirrhosis: With portal hypertension Follows up with Dr. Deliah Goody; as an outpatient. Her abdominal pain is better. Marland Kitchen Restarted  Lasix 40 mg twice a day, Aldactone 100 MG every day. Advised  to abstain from alcohol.  #3 itching secondary to cirrhosis continue Atarax. #4 ; COPD;continue  Symbicort.,duoneb  #5 history of depression continue Lexapro #5 hypokalemia replace the potassium 6.Alcohol  withdrawal continue  CIWA  Protocol..pt more focussed on getting higher doses of ativan,klonipin. I cant increase both due to concern for safetey and respiratory depression. Possible d/c am  IF GI is Okay.   All the records are reviewed and case discussed with Care Management/Social Workerr. Management plans discussed with the patient, family and they are in agreement.  CODE STATUS: full  TOTAL TIME TAKING CARE OF THIS PATIENT: 35 minutes.   POSSIBLE D/C IN 1-2 DAYS, DEPENDING ON CLINICAL CONDITION.   Katha Hamming M.D on 04/06/2015 at 4:08 PM  Between 7am to 6pm - Pager - [phone]  After 6pm go to [email or web address] - password [email or web address] [facility]  [place] [facility]  Office   [phone]  CC: Primary care physician; Luna Fuse, MD

## 2015-04-06 NOTE — Progress Notes (Signed)
Liver enzymes cont to trend down.   Likely cause was ETOH or ingestion.  Should be close to ready for d/c from live enzyme standpoint with close outpt monitoring of liver enzyems.

## 2015-04-07 MED ORDER — LORAZEPAM 2 MG PO TABS
0.0000 mg | ORAL_TABLET | Freq: Four times a day (QID) | ORAL | Status: DC | PRN
  Administered 2015-04-07: 2 mg via ORAL
  Filled 2015-04-07: qty 1

## 2015-04-07 MED ORDER — HYDROXYZINE HCL 25 MG PO TABS: 25.0000 mg | ORAL_TABLET | Freq: Three times a day (TID) | ORAL | Status: AC | PRN

## 2015-04-07 NOTE — Progress Notes (Signed)
Pt A and O x 4. VSS. Pt tolerating diet well. Minimal complaints of pain with pt not wanting to stay one hour to reassess oral meds, so no meds administered. Patient no longer had IV access this morning. Prescription given to patient and nurse went down to get patients home meds from pharmacy. Pt voiced understanding of discharge instructions with no further questions. Pt discharged via wheelchair with axillary.

## 2015-04-07 NOTE — Discharge Instructions (Signed)
°  DIET:  °Cardiac diet ° °DISCHARGE CONDITION:  °Stable ° °ACTIVITY:  °Activity as tolerated ° °OXYGEN:  °Home Oxygen: No. °  °Oxygen Delivery: room air ° °DISCHARGE LOCATION:  °home  ° °If you experience worsening of your admission symptoms, develop shortness of breath, life threatening emergency, suicidal or homicidal thoughts you must seek medical attention immediately by calling 911 or calling your MD immediately  if symptoms less severe. ° °You Must read complete instructions/literature along with all the possible adverse reactions/side effects for all the Medicines you take and that have been prescribed to you. Take any new Medicines after you have completely understood and accpet all the possible adverse reactions/side effects.  ° °Please note ° °You were cared for by a hospitalist during your hospital stay. If you have any questions about your discharge medications or the care you received while you were in the hospital after you are discharged, you can call the unit and asked to speak with the hospitalist on call if the hospitalist that took care of you is not available. Once you are discharged, your primary care physician will handle any further medical issues. Please note that NO REFILLS for any discharge medications will be authorized once you are discharged, as it is imperative that you return to your primary care physician (or establish a relationship with a primary care physician if you do not have one) for your aftercare needs so that they can reassess your need for medications and monitor your lab values. ° °QUIT ALCOHOL AND SMOKING ° °

## 2015-04-07 NOTE — Progress Notes (Signed)
Notified Dr. Elpidio Anis of low BP, hold spironolactone

## 2015-04-08 NOTE — Discharge Summary (Signed)
[facility] - [place] at [facility]   PATIENT NAME: Susan [name]    MR#:  [id number]  DATE OF BIRTH:  1970/11/25  DATE OF ADMISSION:  04/02/2015 ADMITTING PHYSICIAN: Adrian Saran, MD  DATE OF DISCHARGE: 04/07/2015  2:37 PM  PRIMARY CARE PHYSICIAN: Luna Fuse, MD    ADMISSION DIAGNOSIS:  Alcoholism [F10.20] Transaminitis [R74.0] Alcoholic cirrhosis of liver without ascites [K70.30]  DISCHARGE DIAGNOSIS:  Active Problems:   Elevated LFTs   SECONDARY DIAGNOSIS:   Past Medical History  Diagnosis Date  . Hypertension   . Cirrhosis   . Hyperlipidemia   . COPD (chronic obstructive pulmonary disease) 12/09/2014  . Allergy   . Anemia   . Anxiety   . Asthma   . Depression   . Emphysema of lung   . GERD (gastroesophageal reflux disease)   . Heart murmur      ADMITTING HISTORY  Susan [name] is a 44 y.o. female with a known history of liver cirrhosis and COPD with ongoing tobacco abuse and alcohol abuse who presents with above complaint. Patient reports over the past 3 days she's had generalized abdominal pain which has increased in frequency. She also reports nausea and vomiting some mucus. She denies diarrhea. She called her GI physician this morning and they recommended that she come to the emergency room for further evaluation. In the emergency room she was noted to have elevated LFTs including AST, ALT and bilirubin. Her abdominal ultrasound shows no evidence of gallstones. She says she drinks 8-9 cans of beer a week.   HOSPITAL COURSE:   * Alcoholic hepatitis over cirrhosis. Liver enzymes improving. Seen by Dr. Shelle Iron of GI. No steroids advised.  * Alcoholic liver cirrhosis On Aldactone and Lasix scheduled at home.  #3 itching secondary to cirrhosis continue Atarax. #4 ; COPD;continue Symbicort.,duoneb #5 history of depression continue Lexapro #5 hypokalemia replace the potassium * Alcohol withdrawal Patient was on the CIWA  protocol. Would also have resolved. We'll continue her home dose of Klonopin when necessary.  Stable for discharge home to follow-up with GI.  CONSULTS OBTAINED:  Treatment Team:  Elnita Maxwell, MD  DRUG ALLERGIES:   Allergies  Allergen Reactions  . Doxycycline Hives  . Latex Hives  . Promethazine Diarrhea  . Ranitidine Nausea Only  . Zofran [Ondansetron Hcl] Itching    DISCHARGE MEDICATIONS:   Discharge Medication List as of 04/07/2015  1:08 PM    CONTINUE these medications which have CHANGED   Details  hydrOXYzine (ATARAX/VISTARIL) 25 MG tablet Take 1 tablet (25 mg total) by mouth 3 (three) times daily as needed (itching)., Starting 04/07/2015, Until Discontinued, Print      CONTINUE these medications which have NOT CHANGED   Details  albuterol (PROVENTIL HFA;VENTOLIN HFA) 108 (90 BASE) MCG/ACT inhaler Inhale 1 puff into the lungs every 4 (four) hours as needed. , Starting 11/16/2014, Until Discontinued, Historical Med    budesonide-formoterol (SYMBICORT) 80-4.5 MCG/ACT inhaler Inhale 2 puffs into the lungs daily. 2 (two) times daily., Starting 04/21/2014, Until Discontinued, Historical Med    ciprofloxacin (CIPRO) 250 MG tablet Take 250 mg by mouth once a week. , Starting 11/12/2014, Until Discontinued, Historical Med    clonazePAM (KLONOPIN) 1 MG tablet Take 1 mg by mouth 2 (two) times daily as needed. , Until Discontinued, Historical Med    escitalopram (LEXAPRO) 20 MG tablet Take 20 mg by mouth daily. , Until Discontinued, Historical Med    furosemide (LASIX) 40 MG tablet TAKE 1  TABLET BY MOUTH TWICE DAILY FOR FLUID, Starting 01/13/2014, Until Discontinued, Historical Med    ipratropium-albuterol (DUONEB) 0.5-2.5 (3) MG/3ML SOLN once daily as needed., Starting 08/18/2014, Until Discontinued, Historical Med    loratadine (CLARITIN) 10 MG tablet Take 10 mg by mouth daily. , Until Discontinued, Historical Med    omeprazole (PRILOSEC) 40 MG capsule Take 40 mg by mouth  daily. , Until Discontinued, Historical Med    Oxycodone HCl 10 MG TABS Take 10 mg by mouth 3 (three) times daily as needed. , Starting 11/16/2014, Until Discontinued, Historical Med    Potassium Chloride ER 20 MEQ TBCR Take 40 mEq by mouth daily. , Starting 09/28/2014, Until Discontinued, Historical Med    spironolactone (ALDACTONE) 100 MG tablet TAKE 1 TABLET BY MOUTH EVERY DAY, Starting 11/13/2014, Until Discontinued, Historical Med    hydrocortisone (ANUSOL-HC) 25 MG suppository Place 1 suppository (25 mg total) rectally 2 (two) times daily., Starting 12/12/2014, Until Discontinued, Print         Today    VITAL SIGNS:  Blood pressure 92/62, pulse 82, temperature 97.8 F (36.6 C), temperature source Oral, resp. rate 18, height  (1.6 m), weight 72.576 kg (160 lb), SpO2 96 %.  I/O:  No intake or output data in the 24 hours ending 04/08/15 1256  PHYSICAL EXAMINATION:  Physical Exam  GENERAL:  44 y.o.-year-old patient lying in the bed with no acute distress.  LUNGS: Normal breath sounds bilaterally, no wheezing, rales,rhonchi or crepitation. No use of accessory muscles of respiration.  CARDIOVASCULAR: S1, S2 normal. No murmurs, rubs, or gallops.  ABDOMEN: Soft, non-tender, non-distended. Bowel sounds present. No organomegaly or mass.  NEUROLOGIC: Moves all 4 extremities. PSYCHIATRIC: The patient is alert and oriented x 3.  SKIN: No obvious rash, lesion, or ulcer.   DATA REVIEW:   CBC  Recent Labs Lab 04/05/15 0637  WBC 6.1  HGB 14.1  HCT 41.4  PLT 125*    Chemistries   Recent Labs Lab 04/06/15 1015  NA 140  K 3.1*  CL 105  CO2 26  GLUCOSE 111*  BUN 7  CREATININE 0.59  CALCIUM 9.0  AST 339*  ALT 477*  ALKPHOS 186*  BILITOT 1.2    Cardiac Enzymes No results for input(s): TROPONINI in the last 168 hours.  Microbiology Results  Results for orders placed or performed during the hospital encounter of 12/09/14  C difficile quick scan w PCR reflex [facility])      Status: None   Collection Time: 12/10/14 12:37 AM  Result Value Ref Range Status   C Diff antigen NEGATIVE  Final   C Diff toxin NEGATIVE  Final   C Diff interpretation NEGATIVE  Final    RADIOLOGY:  No results found.    Follow up with PCP in 1 week.  Management plans discussed with the patient, family and they are in agreement.  CODE STATUS:   TOTAL TIME TAKING CARE OF THIS PATIENT ON DAY OF DISCHARGE: more than 30 minutes.    Milagros Loll R M.D on 04/08/2015 at 12:56 PM  Between 7am to 6pm - Pager - [phone]  After 6pm go to [email or web address] - password [email or web address] [facility]  [place] [facility]  Office  [phone]  CC: Primary care physician; Luna Fuse, MD

## 2015-04-26 ENCOUNTER — Other Ambulatory Visit: Payer: Self-pay | Admitting: Internal Medicine

## 2015-04-26 DIAGNOSIS — M5431 Sciatica, right side: Secondary | ICD-10-CM

## 2015-05-04 ENCOUNTER — Ambulatory Visit: Payer: Medicaid Other

## 2015-05-29 ENCOUNTER — Emergency Department: Payer: Medicaid Other

## 2015-05-29 ENCOUNTER — Emergency Department
Admission: EM | Admit: 2015-05-29 | Discharge: 2015-05-30 | Disposition: A | Discharge status: Home / Self Care | Payer: Medicaid Other | Attending: Emergency Medicine | Admitting: Emergency Medicine

## 2015-05-29 DIAGNOSIS — Z720 Tobacco use: Secondary | ICD-10-CM | POA: Diagnosis not present

## 2015-05-29 DIAGNOSIS — Y998 Other external cause status: Secondary | ICD-10-CM | POA: Diagnosis not present

## 2015-05-29 DIAGNOSIS — S199XXA Unspecified injury of neck, initial encounter: Secondary | ICD-10-CM | POA: Diagnosis present

## 2015-05-29 DIAGNOSIS — S0990XA Unspecified injury of head, initial encounter: Secondary | ICD-10-CM | POA: Insufficient documentation

## 2015-05-29 DIAGNOSIS — T148 Other injury of unspecified body region: Secondary | ICD-10-CM | POA: Insufficient documentation

## 2015-05-29 DIAGNOSIS — Z91040 Latex allergy status: Secondary | ICD-10-CM | POA: Diagnosis not present

## 2015-05-29 DIAGNOSIS — I10 Essential (primary) hypertension: Secondary | ICD-10-CM | POA: Insufficient documentation

## 2015-05-29 DIAGNOSIS — Z7952 Long term (current) use of systemic steroids: Secondary | ICD-10-CM | POA: Diagnosis not present

## 2015-05-29 DIAGNOSIS — Z79899 Other long term (current) drug therapy: Secondary | ICD-10-CM | POA: Diagnosis not present

## 2015-05-29 DIAGNOSIS — S4992XA Unspecified injury of left shoulder and upper arm, initial encounter: Secondary | ICD-10-CM | POA: Insufficient documentation

## 2015-05-29 DIAGNOSIS — T07XXXA Unspecified multiple injuries, initial encounter: Secondary | ICD-10-CM

## 2015-05-29 DIAGNOSIS — S24109A Unspecified injury at unspecified level of thoracic spinal cord, initial encounter: Secondary | ICD-10-CM | POA: Diagnosis not present

## 2015-05-29 DIAGNOSIS — S34109A Unspecified injury to unspecified level of lumbar spinal cord, initial encounter: Secondary | ICD-10-CM | POA: Diagnosis not present

## 2015-05-29 DIAGNOSIS — Y92009 Unspecified place in unspecified non-institutional (private) residence as the place of occurrence of the external cause: Secondary | ICD-10-CM | POA: Insufficient documentation

## 2015-05-29 DIAGNOSIS — Y9389 Activity, other specified: Secondary | ICD-10-CM | POA: Diagnosis not present

## 2015-05-29 DIAGNOSIS — W11XXXA Fall on and from ladder, initial encounter: Secondary | ICD-10-CM | POA: Insufficient documentation

## 2015-05-29 DIAGNOSIS — Z7951 Long term (current) use of inhaled steroids: Secondary | ICD-10-CM | POA: Insufficient documentation

## 2015-05-29 MED ORDER — HYDROMORPHONE HCL 1 MG/ML IJ SOLN
1.0000 mg | INTRAMUSCULAR | Status: AC
  Administered 2015-05-29: 1 mg via INTRAMUSCULAR

## 2015-05-29 MED ORDER — DIAZEPAM 5 MG PO TABS: 5.0000 mg | ORAL_TABLET | Freq: Three times a day (TID) | ORAL | Status: DC | PRN

## 2015-05-29 MED ORDER — NAPROXEN 500 MG PO TABS: 500.0000 mg | ORAL_TABLET | Freq: Two times a day (BID) | ORAL | Status: DC

## 2015-05-29 NOTE — ED Notes (Signed)
Pt informed that a ride would need to be called. Pt instructed of recovery process and follow-up if needed.

## 2015-05-29 NOTE — ED Notes (Signed)
Pt back from CT. Pt in room with lights dimmed. Bed locked and in lowest position.

## 2015-05-29 NOTE — ED Notes (Signed)
Pt called out reporting severe pain in head. Pt updated that scans have been ordered and MD will be notified of reported pain level. Lights dimmed. Pt resting flat in bed with C-collar on.

## 2015-05-29 NOTE — ED Provider Notes (Signed)
[facility] Emergency Department Provider Note  ____________________________________________  Time seen: 10:15 PM  I have reviewed the triage vital signs and the nursing notes.   HISTORY  Chief Complaint Fall    HPI Susan [name] is a 44 y.o. female who was up on a ladder in her home today after recently drinking alcohol, when she lost balance and fell off. She says she fell flat on the floor, did not lose consciousness but complains of neck pain afterward. She also complains of pain in the left shoulder. No numbness tingling or weakness. No change in vision. No chest pain or shortness of breath. No abdominal pain.     Past Medical History  Diagnosis Date  . Hypertension   . Cirrhosis (HCC)   . Hyperlipidemia   . COPD (chronic obstructive pulmonary disease) (HCC) 12/09/2014  . Allergy   . Anemia   . Anxiety   . Asthma   . Depression   . Emphysema of lung (HCC)   . GERD (gastroesophageal reflux disease)   . Heart murmur      Patient Active Problem List   Diagnosis Date Noted  . Elevated LFTs 04/02/2015  . Hepatic cirrhosis (HCC) 12/12/2014  . Diarrhea 12/09/2014  . Blood in stool 12/09/2014  . Abdominal pain 12/09/2014  . COPD (chronic obstructive pulmonary disease) (HCC) 12/09/2014     Past Surgical History  Procedure Laterality Date  . Cesarean section  2009     Current Outpatient Rx  Name  Route  Sig  Dispense  Refill  . albuterol (PROVENTIL HFA;VENTOLIN HFA) 108 (90 BASE) MCG/ACT inhaler   Inhalation   Inhale 1 puff into the lungs every 4 (four) hours as needed.          . budesonide-formoterol (SYMBICORT) 80-4.5 MCG/ACT inhaler   Inhalation   Inhale 2 puffs into the lungs daily. 2 (two) times daily.         . ciprofloxacin (CIPRO) 250 MG tablet   Oral   Take 250 mg by mouth once a week.       5   . clonazePAM (KLONOPIN) 1 MG tablet   Oral   Take 1 mg by mouth 2 (two) times daily as needed.          . diazepam  (VALIUM) 5 MG tablet   Oral   Take 1 tablet (5 mg total) by mouth every 8 (eight) hours as needed for muscle spasms.   2 tablet   0   . escitalopram (LEXAPRO) 20 MG tablet   Oral   Take 20 mg by mouth daily.          . furosemide (LASIX) 40 MG tablet      TAKE 1 TABLET BY MOUTH TWICE DAILY FOR FLUID         . hydrocortisone (ANUSOL-HC) 25 MG suppository   Rectal   Place 1 suppository (25 mg total) rectally 2 (two) times daily.   12 suppository   0   . hydrOXYzine (ATARAX/VISTARIL) 25 MG tablet   Oral   Take 1 tablet (25 mg total) by mouth 3 (three) times daily as needed (itching).   30 tablet   0   . ipratropium-albuterol (DUONEB) 0.5-2.5 (3) MG/3ML SOLN      once daily as needed.         . loratadine (CLARITIN) 10 MG tablet   Oral   Take 10 mg by mouth daily.          Marland Kitchen  naproxen (NAPROSYN) 500 MG tablet   Oral   Take 1 tablet (500 mg total) by mouth 2 (two) times daily with a meal.   20 tablet   0   . omeprazole (PRILOSEC) 40 MG capsule   Oral   Take 40 mg by mouth daily.          . Oxycodone HCl 10 MG TABS   Oral   Take 10 mg by mouth 3 (three) times daily as needed.       0   . Potassium Chloride ER 20 MEQ TBCR   Oral   Take 40 mEq by mouth daily.       1   . spironolactone (ALDACTONE) 100 MG tablet      TAKE 1 TABLET BY MOUTH EVERY DAY            Allergies Doxycycline; Latex; Promethazine; Ranitidine; and Zofran   Family History  Problem Relation Age of Onset  . Other Mother   . Lung cancer Mother   . Skin cancer Father     Social History Social History  Substance Use Topics  . Smoking status: Current Every Day Smoker -- 1.00 packs/day for 20 years    Types: Cigarettes  . Smokeless tobacco: None  . Alcohol Use: No     Comment: Quit drinking few years ago.    Review of Systems  Constitutional:   No fever or chills. No weight changes Eyes:   No blurry vision or double vision.  ENT:   No sore throat. Cardiovascular:    No chest pain. Respiratory:   No dyspnea or cough. Gastrointestinal:   Negative for abdominal pain, vomiting and diarrhea.  No BRBPR or melena. Genitourinary:   Negative for dysuria, urinary retention, bloody urine, or difficulty urinating. Musculoskeletal:   Neck pain and left shoulder pain after fall Skin:   Negative for rash. Neurological:   Negative for headaches, focal weakness or numbness. Psychiatric:  No anxiety or depression.   Endocrine:  No hot/cold intolerance, changes in energy, or sleep difficulty.  10-point ROS otherwise negative.  ____________________________________________   PHYSICAL EXAM:  VITAL SIGNS: ED Triage Vitals  Enc Vitals Group     BP 05/29/15 2144 114/79 mmHg     Pulse Rate 05/29/15 2144 80     Resp --      Temp 05/29/15 2144 97.7 F (36.5 C)     Temp Source 05/29/15 2144 Oral     SpO2 05/29/15 2144 98 %     Weight 05/29/15 2144 160 lb (72.576 kg)     Height 05/29/15 2144  (1.6 m)     Head Cir --      Peak Flow --      Pain Score 05/29/15 2145 10     Pain Loc --      Pain Edu? --      Excl. in GC? --      Constitutional:   Alert and oriented. Well appearing and in mild distress due to pain Eyes:   No scleral icterus. No conjunctival pallor. PERRL. EOMI ENT   Head:   Normocephalic and atraumatic.   Nose:   No congestion/rhinnorhea. No septal hematoma   Mouth/Throat:   MMM, no pharyngeal erythema. No peritonsillar mass. No uvula shift. Multiple decayed teeth   Neck:   No stridor. No SubQ emphysema. No meningismus. Mild tenderness diffusely in the mid neck Hematological/Lymphatic/Immunilogical:   No cervical lymphadenopathy. Cardiovascular:   RRR. Normal and symmetric distal pulses  are present in all extremities. No murmurs, rubs, or gallops. Respiratory:   Normal respiratory effort without tachypnea nor retractions. Breath sounds are clear and equal bilaterally. No wheezes/rales/rhonchi. Gastrointestinal:   Soft and  nontender. No distention. There is no CVA tenderness.  No rebound, rigidity, or guarding. Genitourinary:   deferred Musculoskeletal:   Nontender with pain on range of motion of the left shoulder. There is full range of motion everywhere. Hips are stable, thoracic and lumbar spine tender and stable.  Neurologic:   Normal speech and language.  CN 2-10 normal. Motor grossly intact. No gross focal neurologic deficits are appreciated.  Skin:    Skin is warm, dry and intact. No rash noted.  No petechiae, purpura, or bullae. Psychiatric:   Mood and affect are normal. Speech and behavior are normal. Patient exhibits appropriate insight and judgment.  ____________________________________________    LABS (pertinent positives/negatives) (all labs ordered are listed, but only abnormal results are displayed) Labs Reviewed - No data to display ____________________________________________   EKG    ____________________________________________    RADIOLOGY  CT head and neck unremarkable X-ray left shoulder unremarkable  ____________________________________________   PROCEDURES   ____________________________________________   INITIAL IMPRESSION / ASSESSMENT AND PLAN / ED COURSE  Pertinent labs & imaging results that were available during my care of the patient were reviewed by me and considered in my medical decision making (see chart for details).  Patient presents with muscular skeletal complaints after a fall off of a ladder. Due to recent alcohol use and reported height of the fall, we will proceed with CTs of the head and neck as well as x-ray of the shoulder.  ----------------------------------------- 11:16 PM on 05/29/2015 -----------------------------------------  Workup negative. Patient ambulatory with steady gait, tolerating oral intake, clinically sober. Patient counseled on avoiding driving and equipment use and other restrictions related to benzodiazepine prescription.  We'll give her NSAIDs and then some benzos for sleep and have her follow up with primary care in acute days.     ____________________________________________   FINAL CLINICAL IMPRESSION(S) / ED DIAGNOSES  Final diagnoses:  Multiple contusions      Sharman CheekPhillip Lilienne Weins, MD 05/29/15 2316

## 2015-05-29 NOTE — ED Notes (Signed)
Pt arrives via EMS reporting a fall from approximately 7-8 feet from a ladder, after having "4 beers". C/O head, neck, as well as left arm pain. Vitals per EMS BP 128/94, P 93, O2 100%, R 18, CBG 120

## 2015-05-29 NOTE — ED Notes (Signed)
Patient transported to CT 

## 2015-06-03 ENCOUNTER — Ambulatory Visit: Payer: Medicaid Other

## 2015-06-17 ENCOUNTER — Ambulatory Visit: Payer: Medicaid Other | Attending: Internal Medicine | Admitting: Physical Therapy

## 2015-07-07 ENCOUNTER — Emergency Department
Admission: EM | Admit: 2015-07-07 | Discharge: 2015-07-07 | Disposition: A | Discharge status: Home / Self Care | Payer: Medicaid Other | Attending: Emergency Medicine | Admitting: Emergency Medicine

## 2015-07-07 ENCOUNTER — Encounter: Payer: Self-pay | Admitting: Emergency Medicine

## 2015-07-07 ENCOUNTER — Emergency Department: Payer: Medicaid Other

## 2015-07-07 DIAGNOSIS — Z91040 Latex allergy status: Secondary | ICD-10-CM | POA: Insufficient documentation

## 2015-07-07 DIAGNOSIS — G44209 Tension-type headache, unspecified, not intractable: Secondary | ICD-10-CM | POA: Insufficient documentation

## 2015-07-07 DIAGNOSIS — F17210 Nicotine dependence, cigarettes, uncomplicated: Secondary | ICD-10-CM | POA: Diagnosis not present

## 2015-07-07 DIAGNOSIS — R195 Other fecal abnormalities: Secondary | ICD-10-CM | POA: Insufficient documentation

## 2015-07-07 DIAGNOSIS — Z7951 Long term (current) use of inhaled steroids: Secondary | ICD-10-CM | POA: Diagnosis not present

## 2015-07-07 DIAGNOSIS — I10 Essential (primary) hypertension: Secondary | ICD-10-CM | POA: Insufficient documentation

## 2015-07-07 DIAGNOSIS — R51 Headache: Secondary | ICD-10-CM | POA: Diagnosis present

## 2015-07-07 DIAGNOSIS — Z79899 Other long term (current) drug therapy: Secondary | ICD-10-CM | POA: Diagnosis not present

## 2015-07-07 HISTORY — DX: Unspecified viral hepatitis C without hepatic coma: B19.20

## 2015-07-07 LAB — COMPREHENSIVE METABOLIC PANEL
ALK PHOS: 104 U/L (ref 38–126)
ALT: 256 U/L — AB (ref 14–54)
AST: 265 U/L — AB (ref 15–41)
Albumin: 4.2 g/dL (ref 3.5–5.0)
Anion gap: 11 (ref 5–15)
CALCIUM: 8.9 mg/dL (ref 8.9–10.3)
CO2: 21 mmol/L — ABNORMAL LOW (ref 22–32)
CREATININE: 0.4 mg/dL — AB (ref 0.44–1.00)
Chloride: 97 mmol/L — ABNORMAL LOW (ref 101–111)
GFR calc Af Amer: >60 mL/min (ref >60)
Glucose, Bld: 121 mg/dL — ABNORMAL HIGH (ref 65–99)
Potassium: 3.6 mmol/L (ref 3.5–5.1)
Sodium: 129 mmol/L — ABNORMAL LOW (ref 135–145)
Total Bilirubin: 0.8 mg/dL (ref 0.3–1.2)
Total Protein: 7.8 g/dL (ref 6.5–8.1)

## 2015-07-07 LAB — CBC
HCT: 44.8 % (ref 35.0–47.0)
Hemoglobin: 15.3 g/dL (ref 12.0–16.0)
MCH: 33.1 pg (ref 26.0–34.0)
MCHC: 34 g/dL (ref 32.0–36.0)
MCV: 97.2 fL (ref 80.0–100.0)
PLATELETS: 180 10*3/uL (ref 150–440)
RBC: 4.61 MIL/uL (ref 3.80–5.20)
RDW: 13 % (ref 11.5–14.5)
WBC: 8.3 10*3/uL (ref 3.6–11.0)

## 2015-07-07 LAB — LIPASE, BLOOD: LIPASE: 25 U/L (ref 11–51)

## 2015-07-07 MED ORDER — HYDROMORPHONE HCL 1 MG/ML IJ SOLN
1.0000 mg | Freq: Once | INTRAMUSCULAR | Status: AC
Start: 2015-07-07 — End: 2015-07-07
  Administered 2015-07-07: 1 mg via INTRAVENOUS
  Filled 2015-07-07: qty 1

## 2015-07-07 MED ORDER — PANTOPRAZOLE SODIUM 20 MG PO TBEC: 20.0000 mg | DELAYED_RELEASE_TABLET | Freq: Every day | ORAL | Status: DC

## 2015-07-07 MED ORDER — LORAZEPAM 2 MG/ML IJ SOLN
1.0000 mg | Freq: Once | INTRAMUSCULAR | Status: AC
  Administered 2015-07-07: 1 mg via INTRAVENOUS
  Filled 2015-07-07: qty 1

## 2015-07-07 MED ORDER — SODIUM CHLORIDE 0.9 % IV BOLUS (SEPSIS)
500.0000 mL | Freq: Once | INTRAVENOUS | Status: AC
  Administered 2015-07-07: 500 mL via INTRAVENOUS

## 2015-07-07 MED ORDER — TRAMADOL HCL 50 MG PO TABS: 50.0000 mg | ORAL_TABLET | Freq: Four times a day (QID) | ORAL | Status: DC | PRN

## 2015-07-07 NOTE — ED Notes (Signed)
Waiting on disposition

## 2015-07-07 NOTE — ED Notes (Signed)
Pt is having difficulty getting words out. Difficult top get story.  Blood in stool X 2 weeks per pt.  Red, black, and yellow stool.  C/o headache and neck pain.

## 2015-07-07 NOTE — ED Provider Notes (Signed)
Time Seen: Approximately 10:30  I have reviewed the triage notes  Chief Complaint: Blood In Stools   History of Present Illness: Susan [name] is a 44 y.o. female who presents with 2 prior complaints. Patient is describing a headache which is in the back of her head and also in the left side of her neck. She denies any focal neurologic deficits. She denies any fever. She states she had a previous trauma was seen and evaluated here with a negative CAT scan of the head and neck. Patient's secondary complaint is some dark stool concern for upper gastrointestinal bleeding. She denies any significant abdominal pain. She has been on Naprosyn for chronic pain and denies any bright red blood per rectum. She states she has a known history of an external hemorrhoid which isn't painful at this time. She describes her stool as dark coffee-ground like without true diarrhea. She has a history of cirrhosis secondary to hepatitis C   Past Medical History  Diagnosis Date  . Hypertension   . Cirrhosis (HCC)   . Hyperlipidemia   . COPD (chronic obstructive pulmonary disease) (HCC) 12/09/2014  . Allergy   . Anemia   . Anxiety   . Asthma   . Depression   . Emphysema of lung (HCC)   . GERD (gastroesophageal reflux disease)   . Heart murmur   . Hepatitis C     pt said they told her she had this last visit here    Patient Active Problem List   Diagnosis Date Noted  . Elevated LFTs 04/02/2015  . Hepatic cirrhosis (HCC) 12/12/2014  . Diarrhea 12/09/2014  . Blood in stool 12/09/2014  . Abdominal pain 12/09/2014  . COPD (chronic obstructive pulmonary disease) (HCC) 12/09/2014    Past Surgical History  Procedure Laterality Date  . Cesarean section  2009    Past Surgical History  Procedure Laterality Date  . Cesarean section  2009    Current Outpatient Rx  Name  Route  Sig  Dispense  Refill  . albuterol (PROVENTIL HFA;VENTOLIN HFA) 108 (90 BASE) MCG/ACT inhaler   Inhalation   Inhale 1 puff  into the lungs every 4 (four) hours as needed for wheezing or shortness of breath.          . budesonide-formoterol (SYMBICORT) 80-4.5 MCG/ACT inhaler   Inhalation   Inhale 2 puffs into the lungs 2 (two) times daily.          . clonazePAM (KLONOPIN) 1 MG tablet   Oral   Take 1 mg by mouth 2 (two) times daily.          Marland Kitchen escitalopram (LEXAPRO) 20 MG tablet   Oral   Take 20 mg by mouth at bedtime.          . furosemide (LASIX) 40 MG tablet   Oral   Take 40 mg by mouth 2 (two) times daily.          . hydrOXYzine (ATARAX/VISTARIL) 25 MG tablet   Oral   Take 1 tablet (25 mg total) by mouth 3 (three) times daily as needed (itching). Patient taking differently: Take 25 mg by mouth 3 (three) times daily as needed for itching.    30 tablet   0   . ipratropium-albuterol (DUONEB) 0.5-2.5 (3) MG/3ML SOLN   Nebulization   Take 3 mLs by nebulization every 6 (six) hours as needed (for shortness of breath/wheezing).          Marland Kitchen loratadine (CLARITIN) 10 MG tablet  Oral   Take 10 mg by mouth daily.          Marland Kitchen omeprazole (PRILOSEC) 40 MG capsule   Oral   Take 40 mg by mouth daily.          . Potassium Chloride ER 20 MEQ TBCR   Oral   Take 40 mEq by mouth daily.       1   . spironolactone (ALDACTONE) 100 MG tablet   Oral   Take 100 mg by mouth daily.          . diazepam (VALIUM) 5 MG tablet   Oral   Take 1 tablet (5 mg total) by mouth every 8 (eight) hours as needed for muscle spasms. Patient not taking: Reported on 07/07/2015   2 tablet   0   . naproxen (NAPROSYN) 500 MG tablet   Oral   Take 1 tablet (500 mg total) by mouth 2 (two) times daily with a meal. Patient not taking: Reported on 07/07/2015   20 tablet   0   . pantoprazole (PROTONIX) 20 MG tablet   Oral   Take 1 tablet (20 mg total) by mouth daily.   30 tablet   1   . traMADol (ULTRAM) 50 MG tablet   Oral   Take 1 tablet (50 mg total) by mouth every 6 (six) hours as needed.   20 tablet    0     Allergies:  Doxycycline; Latex; Promethazine; Ranitidine; and Zofran  Family History: Family History  Problem Relation Age of Onset  . Other Mother   . Lung cancer Mother   . Skin cancer Father     Social History: Social History  Substance Use Topics  . Smoking status: Current Every Day Smoker -- 1.00 packs/day for 20 years    Types: Cigarettes  . Smokeless tobacco: None  . Alcohol Use: No     Comment: Quit drinking few years ago.     Review of Systems:   10 point review of systems was performed and was otherwise negative:  Constitutional: No fever Eyes: No visual disturbances ENT: No sore throat, ear pain Cardiac: No chest pain Respiratory: No shortness of breath, wheezing, or stridor Abdomen: No abdominal pain, no vomiting, No diarrhea Endocrine: No weight loss, No night sweats Extremities: No peripheral edema, cyanosis Skin: No rashes, easy bruising Neurologic: No focal weakness, trouble with speech or swollowing Urologic: No dysuria, Hematuria, or urinary frequency   Physical Exam:  ED Triage Vitals  Enc Vitals Group     BP 07/07/15 1008 117/85 mmHg     Pulse Rate 07/07/15 1008 93     Resp 07/07/15 1008 18     Temp 07/07/15 1012 98.1 F (36.7 C)     Temp Source 07/07/15 1012 Oral     SpO2 07/07/15 1008 95 %     Weight 07/07/15 1008 160 lb (72.576 kg)     Height 07/07/15 1008 5\' 3"  (1.6 m)     Head Cir --      Peak Flow --      Pain Score 07/07/15 1009 10     Pain Loc --      Pain Edu? --      Excl. in GC? --     General: Awake , Alert , and Oriented times 3; GCS 15 Head: Headache is reproducible palpation across the left parietal occipital area and also down into the left side of the neck without any rashes Eyes: Pupils equal ,  round, reactive to light Nose/Throat: No nasal drainage, patent upper airway without erythema or exudate.  Neck: Supple, Full range of motion, No anterior adenopathy or palpable thyroid masses Lungs: Clear to  ascultation without wheezes , rhonchi, or rales Heart: Regular rate, regular rhythm without murmurs , gallops , or rubs Abdomen: Soft, non tender without rebound, guarding , or rigidity; bowel sounds positive and symmetric in all 4 quadrants. No organomegaly .        Extremities: 2 plus symmetric pulses. No edema, clubbing or cyanosis Neurologic: normal ambulation, Motor symmetric without deficits, sensory intact Skin: warm, dry, no rashes Rectal exam with chaperone present was trace guaiac positive with normal sphincter tone and external hemorrhoid and it's nontender.  Labs:   All laboratory work was reviewed including any pertinent negatives or positives listed below:  Labs Reviewed  COMPREHENSIVE METABOLIC PANEL - Abnormal; Notable for the following:    Sodium 129 (*)    Chloride 97 (*)    CO2 21 (*)    Glucose, Bld 121 (*)    BUN <5 (*)    Creatinine, Ser 0.40 (*)    AST 265 (*)    ALT 256 (*)    All other components within normal limits  LIPASE, BLOOD  CBC   ofthe patient's sodium is low at 129   Radiology:     EXAM: CT HEAD WITHOUT CONTRAST  TECHNIQUE: Contiguous axial images were obtained from the base of the skull through the vertex without intravenous contrast.  COMPARISON: Head CT dated 05/29/2015.  FINDINGS: Again noted is generalized brain atrophy, at least mild in degree for age, with commensurate dilatation of the ventricles and sulci. All areas of the brain demonstrate normal gray-white matter attenuation. There is no mass, hemorrhage, edema, or other evidence of acute parenchymal abnormality. No extra-axial hemorrhage.  No osseous fracture or dislocation seen. Paranasal sinuses are now clear. Mastoid air cells are clear. Superficial soft tissues are unremarkable.  IMPRESSION: No evidence of acute intracranial abnormality. No intracranial mass, hemorrhage, or edema. No fracture. I personally reviewed the radiologic studies    ED Course:   Patient's stay here showed symptomatic improvement after she received Dilaudid and Zofran. The patient's headache seems to be more of a muscle tension headache and the patient has chronic pain issues and is normally on oxycodone which she states she'll be able to fill on Monday. I advised her I didn't feel comfortable giving her more prescriptions for narcotic medications. I advised her that needed her to stop her Naprosyn and a prescribed her Ultram for the possibility up or gastrointestinal small amount of bleeding. Patient's sodium is slightly low and she was given a fluid bolus here in emergency department. Patient denies drinking excess water. Patient has a local primary physician and gastroenterologist and is followed by the [facility] in [facility]. She was advised to call both entities for further outpatient management and prescribed her Ultram for pain along with proton next for possible mild upper gastrointestinal bleed. Her stool is guaiac positive though is only trace and I doubt significant bleeding at this time.    Assessment: * Muscle tension headache Gastritis  Final Clinical Impression:   Final diagnoses:  Tension-type headache, not intractable, unspecified chronicity pattern     Plan:  Outpatient management Patient was advised to return immediately if condition worsens. Patient was advised to follow up with her primary care physician or other specialized physicians involved and in their current assessment.  Jennye MoccasinBrian S Quigley, MD 07/07/15 [phone]

## 2015-07-07 NOTE — Discharge Instructions (Signed)
Tension Headache A tension headache is pain, pressure, or aching that is felt over the front and sides of your head. These headaches can last from 30 minutes to several days. HOME CARE Managing Pain  Take over-the-counter and prescription medicines only as told by your doctor.  Lie down in a dark, quiet room when you have a headache.  If directed, apply ice to your head and neck area:  Put ice in a plastic bag.  Place a towel between your skin and the bag.  Leave the ice on for 20 minutes, 2-3 times per day.  Use a heating pad or a hot shower to apply heat to your head and neck area as told by your doctor. Eating and Drinking  Eat meals on a regular schedule.  Do not drink a lot of alcohol.  Do not use a lot of caffeine, or stop using caffeine. General Instructions  Keep all follow-up visits as told by your doctor. This is important.  Keep a journal to find out if certain things bring on headaches. For example, write down:  What you eat and drink.  How much sleep you get.  Any change to your diet or medicines.  Try getting a massage, or doing other things that help you to relax.  Lessen stress.  Sit up straight. Do not tighten (tense) your muscles.  Do not use tobacco products. This includes cigarettes, chewing tobacco, or e-cigarettes. If you need help quitting, ask your doctor.  Exercise regularly as told by your doctor.  Get enough sleep. This may mean 7-9 hours of sleep. GET HELP IF:  Your symptoms are not helped by medicine.  You have a headache that feels different from your usual headache.  You feel sick to your stomach (nauseous) or you throw up (vomit).  You have a fever. GET HELP RIGHT AWAY IF:  Your headache becomes very bad.  You keep throwing up.  You have a stiff neck.  You have trouble seeing.  You have trouble speaking.  You have pain in your eye or ear.  Your muscles are weak or you lose muscle control.  You lose your balance  or you have trouble walking.  You feel like you will pass out (faint) or you pass out.  You have confusion.   This information is not intended to replace advice given to you by your health care provider. Make sure you discuss any questions you have with your health care provider.   Document Released: 10/18/2009 Document Revised: 04/14/2015 Document Reviewed: 11/16/2014 Elsevier Interactive Patient Education 2016 [employer]  

## 2015-07-07 NOTE — ED Notes (Signed)
MD at bedside. 

## 2015-08-12 ENCOUNTER — Other Ambulatory Visit: Payer: Self-pay | Admitting: Internal Medicine

## 2015-08-12 DIAGNOSIS — Z1231 Encounter for screening mammogram for malignant neoplasm of breast: Secondary | ICD-10-CM

## 2015-09-03 ENCOUNTER — Ambulatory Visit: Payer: Medicaid Other | Attending: Internal Medicine

## 2015-11-11 ENCOUNTER — Other Ambulatory Visit: Payer: Self-pay | Admitting: Gastroenterology

## 2015-11-11 DIAGNOSIS — K7030 Alcoholic cirrhosis of liver without ascites: Secondary | ICD-10-CM

## 2015-11-17 ENCOUNTER — Ambulatory Visit
Admission: RE | Admit: 2015-11-17 | Discharge: 2015-11-17 | Disposition: A | Discharge status: Home / Self Care | Payer: Medicaid Other | Source: Ambulatory Visit | Attending: Gastroenterology | Admitting: Gastroenterology

## 2015-11-17 DIAGNOSIS — K7030 Alcoholic cirrhosis of liver without ascites: Secondary | ICD-10-CM

## 2015-12-06 ENCOUNTER — Emergency Department
Admission: EM | Admit: 2015-12-06 | Discharge: 2015-12-06 | Disposition: A | Discharge status: Home / Self Care | Payer: Medicaid Other | Attending: Emergency Medicine | Admitting: Emergency Medicine

## 2015-12-06 ENCOUNTER — Emergency Department: Payer: Medicaid Other

## 2015-12-06 ENCOUNTER — Encounter: Payer: Self-pay | Admitting: Emergency Medicine

## 2015-12-06 DIAGNOSIS — F17210 Nicotine dependence, cigarettes, uncomplicated: Secondary | ICD-10-CM | POA: Insufficient documentation

## 2015-12-06 DIAGNOSIS — R0789 Other chest pain: Secondary | ICD-10-CM | POA: Diagnosis present

## 2015-12-06 DIAGNOSIS — K2920 Alcoholic gastritis without bleeding: Secondary | ICD-10-CM | POA: Insufficient documentation

## 2015-12-06 DIAGNOSIS — I10 Essential (primary) hypertension: Secondary | ICD-10-CM | POA: Insufficient documentation

## 2015-12-06 DIAGNOSIS — Z79899 Other long term (current) drug therapy: Secondary | ICD-10-CM | POA: Diagnosis not present

## 2015-12-06 DIAGNOSIS — F329 Major depressive disorder, single episode, unspecified: Secondary | ICD-10-CM | POA: Diagnosis not present

## 2015-12-06 DIAGNOSIS — K7010 Alcoholic hepatitis without ascites: Secondary | ICD-10-CM | POA: Insufficient documentation

## 2015-12-06 DIAGNOSIS — J441 Chronic obstructive pulmonary disease with (acute) exacerbation: Secondary | ICD-10-CM

## 2015-12-06 LAB — COMPREHENSIVE METABOLIC PANEL
ALK PHOS: 109 U/L (ref 38–126)
ALT: 176 U/L — ABNORMAL HIGH (ref 14–54)
ANION GAP: 10 (ref 5–15)
AST: 218 U/L — ABNORMAL HIGH (ref 15–41)
Albumin: 4 g/dL (ref 3.5–5.0)
BILIRUBIN TOTAL: 0.6 mg/dL (ref 0.3–1.2)
BUN: 5 mg/dL — ABNORMAL LOW (ref 6–20)
CALCIUM: 8.6 mg/dL — AB (ref 8.9–10.3)
CO2: 20 mmol/L — ABNORMAL LOW (ref 22–32)
Chloride: 110 mmol/L (ref 101–111)
Creatinine, Ser: 0.55 mg/dL (ref 0.44–1.00)
GFR calc non Af Amer: >60 mL/min (ref >60)
GLUCOSE: 136 mg/dL — AB (ref 65–99)
Potassium: 3.9 mmol/L (ref 3.5–5.1)
Sodium: 140 mmol/L (ref 135–145)
TOTAL PROTEIN: 7.3 g/dL (ref 6.5–8.1)

## 2015-12-06 LAB — CBC WITH DIFFERENTIAL/PLATELET
BASOS PCT: 1 %
Basophils Absolute: 0 10*3/uL (ref 0–0.1)
EOS ABS: 0 10*3/uL (ref 0–0.7)
Eosinophils Relative: 1 %
HCT: 42 % (ref 35.0–47.0)
Hemoglobin: 14.1 g/dL (ref 12.0–16.0)
LYMPHS ABS: 0.7 10*3/uL — AB (ref 1.0–3.6)
Lymphocytes Relative: 27 %
MCH: 32.7 pg (ref 26.0–34.0)
MCHC: 33.6 g/dL (ref 32.0–36.0)
MCV: 97.3 fL (ref 80.0–100.0)
MONO ABS: 0.1 10*3/uL — AB (ref 0.2–0.9)
Monocytes Relative: 4 %
NEUTROS PCT: 67 %
Neutro Abs: 1.9 10*3/uL (ref 1.4–6.5)
PLATELETS: 131 10*3/uL — AB (ref 150–440)
RBC: 4.32 MIL/uL (ref 3.80–5.20)
RDW: 13.9 % (ref 11.5–14.5)
WBC: 2.7 10*3/uL — ABNORMAL LOW (ref 3.6–11.0)

## 2015-12-06 LAB — LIPASE, BLOOD: Lipase: 27 U/L (ref 11–51)

## 2015-12-06 LAB — ETHANOL: ALCOHOL ETHYL (B): 163 mg/dL — AB (ref <5)

## 2015-12-06 MED ORDER — METOCLOPRAMIDE HCL 10 MG PO TABS: 10.0000 mg | ORAL_TABLET | Freq: Three times a day (TID) | ORAL | Status: DC

## 2015-12-06 MED ORDER — SODIUM CHLORIDE 0.9 % IV BOLUS (SEPSIS)
1000.0000 mL | Freq: Once | INTRAVENOUS | Status: AC
  Administered 2015-12-06: 1000 mL via INTRAVENOUS

## 2015-12-06 MED ORDER — IPRATROPIUM-ALBUTEROL 0.5-2.5 (3) MG/3ML IN SOLN
9.0000 mL | Freq: Once | RESPIRATORY_TRACT | Status: AC
  Administered 2015-12-06: 9 mL via RESPIRATORY_TRACT
  Filled 2015-12-06: qty 9

## 2015-12-06 MED ORDER — FAMOTIDINE 20 MG PO TABS: 20.0000 mg | ORAL_TABLET | Freq: Two times a day (BID) | ORAL | Status: DC

## 2015-12-06 MED ORDER — KETOROLAC TROMETHAMINE 30 MG/ML IJ SOLN
30.0000 mg | Freq: Once | INTRAMUSCULAR | Status: AC
  Administered 2015-12-06: 30 mg via INTRAVENOUS
  Filled 2015-12-06: qty 1

## 2015-12-06 MED ORDER — PREDNISONE 20 MG PO TABS: 40.0000 mg | ORAL_TABLET | Freq: Every day | ORAL | Status: DC

## 2015-12-06 MED ORDER — PREDNISONE 20 MG PO TABS
40.0000 mg | ORAL_TABLET | ORAL | Status: AC
  Administered 2015-12-06: 40 mg via ORAL
  Filled 2015-12-06: qty 2

## 2015-12-06 MED ORDER — NAPROXEN 500 MG PO TABS: 500.0000 mg | ORAL_TABLET | Freq: Two times a day (BID) | ORAL | Status: DC

## 2015-12-06 MED ORDER — AZITHROMYCIN 250 MG PO TABS: ORAL_TABLET | ORAL | Status: DC

## 2015-12-06 NOTE — ED Notes (Signed)
Pt presents to ED from home with c/o right chest pain below right breast for a few days. Pain upon palpation ; radiates around breast and sternum. "feels like a knife". 10/10 pain

## 2015-12-06 NOTE — ED Provider Notes (Signed)
[facility] Emergency Department Provider Note  ____________________________________________  Time seen: 10:20 AM  I have reviewed the triage vital signs and the nursing notes.   HISTORY  Chief Complaint Breast Pain and Chest Pain    HPI Susan [name] is a 45 y.o. female who complains of right-sided chest wall pain for the past few days. It's intermittent, aching, radiates around the right lateral chest wall. Also has some shortness of breath and wheezing. She's been using her inhalers without relief. Has a nonproductive cough as well. Denies dizziness or syncope. No exertional symptoms, nonpleuritic.  Pain is aggravated by changing position and moving around. No alleviating factors. No fever or chills     Past Medical History  Diagnosis Date  . Hypertension   . Cirrhosis (HCC)   . Hyperlipidemia   . COPD (chronic obstructive pulmonary disease) (HCC) 12/09/2014  . Allergy   . Anemia   . Anxiety   . Asthma   . Depression   . Emphysema of lung (HCC)   . GERD (gastroesophageal reflux disease)   . Heart murmur   . Hepatitis C     pt said they told her she had this last visit here     Patient Active Problem List   Diagnosis Date Noted  . Elevated LFTs 04/02/2015  . Hepatic cirrhosis (HCC) 12/12/2014  . Diarrhea 12/09/2014  . Blood in stool 12/09/2014  . Abdominal pain 12/09/2014  . COPD (chronic obstructive pulmonary disease) (HCC) 12/09/2014     Past Surgical History  Procedure Laterality Date  . Cesarean section  2009     Current Outpatient Rx  Name  Route  Sig  Dispense  Refill  . albuterol (PROVENTIL HFA;VENTOLIN HFA) 108 (90 BASE) MCG/ACT inhaler   Inhalation   Inhale 1 puff into the lungs every 4 (four) hours as needed for wheezing or shortness of breath.          Marland Kitchen azithromycin (ZITHROMAX Z-PAK) 250 MG tablet      Take 2 tablets (500 mg) on  Day 1,  followed by 1 tablet (250 mg) once daily on Days 2 through 5.   6 each    0   . budesonide-formoterol (SYMBICORT) 80-4.5 MCG/ACT inhaler   Inhalation   Inhale 2 puffs into the lungs 2 (two) times daily.          . clonazePAM (KLONOPIN) 1 MG tablet   Oral   Take 1 mg by mouth 2 (two) times daily.          . diazepam (VALIUM) 5 MG tablet   Oral   Take 1 tablet (5 mg total) by mouth every 8 (eight) hours as needed for muscle spasms. Patient not taking: Reported on 07/07/2015   2 tablet   0   . escitalopram (LEXAPRO) 20 MG tablet   Oral   Take 20 mg by mouth at bedtime.          . famotidine (PEPCID) 20 MG tablet   Oral   Take 1 tablet (20 mg total) by mouth 2 (two) times daily.   60 tablet   0   . furosemide (LASIX) 40 MG tablet   Oral   Take 40 mg by mouth 2 (two) times daily.          . hydrOXYzine (ATARAX/VISTARIL) 25 MG tablet   Oral   Take 1 tablet (25 mg total) by mouth 3 (three) times daily as needed (itching). Patient taking differently: Take 25 mg  by mouth 3 (three) times daily as needed for itching.    30 tablet   0   . ipratropium-albuterol (DUONEB) 0.5-2.5 (3) MG/3ML SOLN   Nebulization   Take 3 mLs by nebulization every 6 (six) hours as needed (for shortness of breath/wheezing).          Marland Kitchen loratadine (CLARITIN) 10 MG tablet   Oral   Take 10 mg by mouth daily.          . metoCLOPramide (REGLAN) 10 MG tablet   Oral   Take 1 tablet (10 mg total) by mouth 4 (four) times daily -  before meals and at bedtime.   60 tablet   0   . naproxen (NAPROSYN) 500 MG tablet   Oral   Take 1 tablet (500 mg total) by mouth 2 (two) times daily with a meal.   20 tablet   0   . omeprazole (PRILOSEC) 40 MG capsule   Oral   Take 40 mg by mouth daily.          . pantoprazole (PROTONIX) 20 MG tablet   Oral   Take 1 tablet (20 mg total) by mouth daily.   30 tablet   1   . Potassium Chloride ER 20 MEQ TBCR   Oral   Take 40 mEq by mouth daily.       1   . predniSONE (DELTASONE) 20 MG tablet   Oral   Take 2 tablets (40 mg  total) by mouth daily.   8 tablet   0   . spironolactone (ALDACTONE) 100 MG tablet   Oral   Take 100 mg by mouth daily.          . traMADol (ULTRAM) 50 MG tablet   Oral   Take 1 tablet (50 mg total) by mouth every 6 (six) hours as needed.   20 tablet   0      Allergies Doxycycline; Latex; Promethazine; Ranitidine; and Zofran   Family History  Problem Relation Age of Onset  . Other Mother   . Lung cancer Mother   . Skin cancer Father     Social History Social History  Substance Use Topics  . Smoking status: Current Every Day Smoker -- 1.00 packs/day for 20 years    Types: Cigarettes  . Smokeless tobacco: None  . Alcohol Use: No     Comment: Quit drinking few years ago.  Reports she quit drinking one year ago and has not drink alcohol any time recently. Reports that she smokes occasionally, none today.  Review of Systems  Constitutional:   No fever or chills.  Eyes:   No vision changes.  ENT:   No sore throat. No rhinorrhea. Cardiovascular:   Positive as above chest pain. Respiratory:   Shortness of breath and cough. Gastrointestinal:   Negative for abdominal pain, vomiting and diarrhea.  Genitourinary:   Negative for dysuria or difficulty urinating. Musculoskeletal:   Negative for focal pain or swelling Neurological:   Negative for headaches 10-point ROS otherwise negative.  ____________________________________________   PHYSICAL EXAM:  VITAL SIGNS: ED Triage Vitals  Enc Vitals Group     BP 12/06/15 0924 108/82 mmHg     Pulse Rate 12/06/15 0924 76     Resp 12/06/15 0924 18     Temp 12/06/15 0924 97.9 F (36.6 C)     Temp Source 12/06/15 0924 Oral     SpO2 12/06/15 0924 98 %     Weight 12/06/15 0924 160  lb (72.576 kg)     Height 12/06/15 0924 5\' 3"  (1.6 m)     Head Cir --      Peak Flow --      Pain Score --      Pain Loc --      Pain Edu? --      Excl. in GC? --     Vital signs reviewed, nursing assessments reviewed.   Constitutional:    Alert and oriented. Well appearing and in no distress.Obvious odor of tobacco and alcohol on breath Eyes:   No scleral icterus. No conjunctival pallor. PERRL. EOMI.  No nystagmus. ENT   Head:   Normocephalic and atraumatic.   Nose:   No congestion/rhinnorhea. No septal hematoma   Mouth/Throat:   Dry mucous membranes, no pharyngeal erythema. No peritonsillar mass. Diffuse dental decay, multiple missing teeth   Neck:   No stridor. No SubQ emphysema. No meningismus. Hematological/Lymphatic/Immunilogical:   No cervical lymphadenopathy. Cardiovascular:   RRR. Symmetric bilateral radial and DP pulses.  No murmurs.  Respiratory:   Normal respiratory effort without tachypnea nor retractions. Symmetric air entry. Normal expiratory phase. Diffuse expiratory wheezing. Deep breathing provokes a coughing reflex. Gastrointestinal:   Soft with diffuse upper abdominal tenderness, mild. Non distended. There is no CVA tenderness.  No rebound, rigidity, or guarding. Genitourinary:   deferred Musculoskeletal:   Nontender with normal range of motion in all extremities. No joint effusions.  No lower extremity tenderness.  No edema. Right inferior chest wall tender to the touch which reproduces her chest pain symptoms. Neurologic:   Normal speech and language.  CN 2-10 normal. Motor grossly intact. No gross focal neurologic deficits are appreciated.  Skin:    Skin is warm, dry and intact. No rash noted.  No petechiae, purpura, or bullae.  ____________________________________________    LABS (pertinent positives/negatives) (all labs ordered are listed, but only abnormal results are displayed) Labs Reviewed  COMPREHENSIVE METABOLIC PANEL - Abnormal; Notable for the following:    CO2 20 (*)    Glucose, Bld 136 (*)    BUN 5 (*)    Calcium 8.6 (*)    AST 218 (*)    ALT 176 (*)    All other components within normal limits  CBC WITH DIFFERENTIAL/PLATELET - Abnormal; Notable for the following:    WBC  2.7 (*)    Platelets 131 (*)    All other components within normal limits  ETHANOL - Abnormal; Notable for the following:    Alcohol, Ethyl (B) 163 (*)    All other components within normal limits  LIPASE, BLOOD   ____________________________________________   EKG  Interpreted by me  Date: 12/06/2015  Rate: 75  Rhythm: normal sinus rhythm  QRS Axis: normal  Intervals: normal  ST/T Wave abnormalities: normal  Conduction Disutrbances: none  Narrative Interpretation: unremarkable      ____________________________________________    RADIOLOGY  Chest x-ray reveals bronchitic changes without acute infiltrate. No other acute findings.  ____________________________________________   PROCEDURES Peripheral IV insertion by physician, performed by me Indication, IV access, blood sampling for laboratory testing, multiple failed attempts by nursing Technique: Performed with real-time continuous ultrasound visualization after identifying a suitable vein in the left antecubital fossa, with one attempt I passed an 20-gauge IV catheter into the vein with good blood return. Is able to easily draw blood samples. Tube flushes easily. Site was prepped with chlorhexidine prior, cleansed afterward and secured with Tegaderm and taped.  ____________________________________________   INITIAL IMPRESSION /  ASSESSMENT AND PLAN / ED COURSE  Pertinent labs & imaging results that were available during my care of the patient were reviewed by me and considered in my medical decision making (see chart for details).  Patient presents with chest wall pain as well as wheezing consistent with COPD exacerbation, likely aggravated by continued smoking. Also has upper abdominal tenderness with elevation in transaminases, similar to prior. She is currently drinking despite her assurance that she quit one year ago, which is likely causing a flareup of alcoholic gastritis and hepatitis. She is not in distress  and actually sleeping comfortably after receiving Toradol and steroids. Breathing is comfortable, oxygen saturation is adequate. I'll discharge home with treatment for bronchitis including azithromycin giving her underlying COPD. Follow-up with primary care. Considering the patient's symptoms, medical history, and physical examination today, I have low suspicion for ACS, PE, TAD, pneumothorax, carditis, mediastinitis, pneumonia, CHF, or sepsis.       ____________________________________________   FINAL CLINICAL IMPRESSION(S) / ED DIAGNOSES  Final diagnoses:  Alcoholic gastritis  Alcoholic hepatitis without ascites  COPD with exacerbation (HCC)       Portions of this note were generated with dragon dictation software. Dictation errors may occur despite best attempts at proofreading.   Sharman Cheek, MD 12/06/15 [phone]

## 2015-12-06 NOTE — Discharge Instructions (Signed)
Alcoholic Hepatitis Alcoholic hepatitis is liver inflammation caused by drinking alcohol. This inflammation decreases the liver's ability to function normally.  CAUSES  Alcoholic hepatitis is caused by heavy drinking.  RISK FACTORS You may have an increased risk of alcoholic hepatitis if:   You drink large amounts of alcohol.  You have been drinking heavily for years.  You binge drink.  You are female.  You are obese.  You have had infectious hepatitis.  You are malnourished.  You have close family members who have had alcoholic hepatitis. SIGNS AND SYMPTOMS  Abdominal pain.  A swollen abdomen.  Loss of appetite.  Unintentional weight loss.  Nausea and vomiting.  Tiredness.  Dry mouth.  Severe thirst.  A yellow tone to the skin and whites of the eyes (jaundice).  Spidery veins, especially across the skin of the abdomen.  Unusual bleeding.  Itching.  Trouble thinking clearly.  Memory problems.  Mood changes.  Confusion.  Numbness and tingling in the feet and legs. DIAGNOSIS  Alcoholic hepatitis is diagnosed with blood tests that show problems with liver function. Additional tests may also be done, such as:  An ultrasound.  A CT scan.  An MRI scan.  A liver biopsy. For this test, a small sample of the liver will be taken and examined for evidence of liver damage. TREATMENT The most important step you can take to treat alcoholic hepatitis is to stop drinking alcohol. If you are addicted to alcohol, your health care provider will help you create a plan to quit. It may involve:  Taking medicine to decrease withdrawal symptoms.  Entering a program to help you stop drinking.  Joining a support group. Additional treatment for alcoholic hepatitis may include:  Medicines such as steroids. The medicines will help decrease the inflammation.  Diet. Your health care provider might ask you to undergo nutritional counseling and follow a diet. You may  also need to take dietary supplements and vitamins.  A liver transplant. This procedure is performed in very severe cases. It is only performed on people who have totally stopped drinking and can commit to never drinking alcohol again. HOME CARE INSTRUCTIONS  Do not drink alcohol.  Do not use medicines or eat foods that contain alcohol.  Take medicines only as directed by your health care provider.  Follow dietary instructions carefully.  Keep all follow-up visits as directed by your health care provider. This is important. SEEK MEDICAL CARE IF:  You have a fever.  You do not have your usual appetite.  You have flu-like symptoms such as fatigue, weakness, or muscle aches.  You feel nauseous or vomit.  You bruise easily.  Your urine is very dark.  You have new abdominal pain. SEEK IMMEDIATE MEDICAL CARE IF:  There is blood in your vomit.  You develop jaundice.  Your skin itches severely.  Your legs swell.  Your stomach appears bloated.  You have black, tarry-appearing stools.  You bleed easily.  You are confused or not thinking clearly.  You have a seizure. MAKE SURE YOU:  Understand these instructions.  Will watch your condition.  Will get help right away if you are not doing well or get worse.   This information is not intended to replace advice given to you by your health care provider. Make sure you discuss any questions you have with your health care provider.   Document Released: 02/18/2014 Document Reviewed: 02/18/2014 Elsevier Interactive Patient Education 2016 [employer]  Chronic Obstructive Pulmonary Disease Exacerbation Chronic obstructive  pulmonary disease (COPD) is a common lung condition in which airflow from the lungs is limited. COPD is a general term that can be used to describe many different lung problems that limit airflow, including chronic bronchitis and emphysema. COPD exacerbations are episodes when breathing symptoms become  much worse and require extra treatment. Without treatment, COPD exacerbations can be life threatening, and frequent COPD exacerbations can cause further damage to your lungs. CAUSES  Respiratory infections.  Exposure to smoke.  Exposure to air pollution, chemical fumes, or dust. Sometimes there is no apparent cause or trigger. RISK FACTORS  Smoking cigarettes.  Older age.  Frequent prior COPD exacerbations. SIGNS AND SYMPTOMS  Increased coughing.  Increased thick spit (sputum) production.  Increased wheezing.  Increased shortness of breath.  Rapid breathing.  Chest tightness. DIAGNOSIS Your medical history, a physical exam, and tests will help your health care provider make a diagnosis. Tests may include:  A chest X-ray.  Basic lab tests.  Sputum testing.  An arterial blood gas test. TREATMENT Depending on the severity of your COPD exacerbation, you may need to be admitted to a hospital for treatment. Some of the treatments commonly used to treat COPD exacerbations are:   Antibiotic medicines.  Bronchodilators. These are drugs that expand the air passages. They may be given with an inhaler or nebulizer. Spacer devices may be needed to help improve drug delivery.  Corticosteroid medicines.  Supplemental oxygen therapy.  Airway clearing techniques, such as noninvasive ventilation (NIV) and positive expiratory pressure (PEP). These provide respiratory support through a mask or other noninvasive device. HOME CARE INSTRUCTIONS  Do not smoke. Quitting smoking is very important to prevent COPD from getting worse and exacerbations from happening as often.  Avoid exposure to all substances that irritate the airway, especially to tobacco smoke.  If you were prescribed an antibiotic medicine, finish it all even if you start to feel better.  Take all medicines as directed by your health care provider.It is important to use correct technique with inhaled  medicines.  Drink enough fluids to keep your urine clear or pale yellow (unless you have a medical condition that requires fluid restriction).  Use a cool mist vaporizer. This makes it easier to clear your chest when you cough.  If you have a home nebulizer and oxygen, continue to use them as directed.  Maintain all necessary vaccinations to prevent infections.  Exercise regularly.  Eat a healthy diet.  Keep all follow-up appointments as directed by your health care provider. SEEK IMMEDIATE MEDICAL CARE IF:  You have worsening shortness of breath.  You have trouble talking.  You have severe chest pain.  You have blood in your sputum.  You have a fever.  You have weakness, vomit repeatedly, or faint.  You feel confused.  You continue to get worse. MAKE SURE YOU:  Understand these instructions.  Will watch your condition.  Will get help right away if you are not doing well or get worse.   This information is not intended to replace advice given to you by your health care provider. Make sure you discuss any questions you have with your health care provider.   Document Released: 05/21/2007 Document Revised: 08/14/2014 Document Reviewed: 03/28/2013 Elsevier Interactive Patient Education 2016 [employer] Inc[employer]  Gastritis, Adult Gastritis is soreness and swelling (inflammation) of the lining of the stomach. Gastritis can develop as a sudden onset (acute) or long-term (chronic) condition. If gastritis is not treated, it can lead to stomach bleeding and ulcers. CAUSES  Gastritis occurs when the stomach lining is weak or damaged. Digestive juices from the stomach then inflame the weakened stomach lining. The stomach lining may be weak or damaged due to viral or bacterial infections. One common bacterial infection is the Helicobacter pylori infection. Gastritis can also result from excessive alcohol consumption, taking certain medicines, or having too much acid in the stomach.   SYMPTOMS  In some cases, there are no symptoms. When symptoms are present, they may include:  Pain or a burning sensation in the upper abdomen.  Nausea.  Vomiting.  An uncomfortable feeling of fullness after eating. DIAGNOSIS  Your caregiver may suspect you have gastritis based on your symptoms and a physical exam. To determine the cause of your gastritis, your caregiver may perform the following:  Blood or stool tests to check for the H pylori bacterium.  Gastroscopy. A thin, flexible tube (endoscope) is passed down the esophagus and into the stomach. The endoscope has a light and camera on the end. Your caregiver uses the endoscope to view the inside of the stomach.  Taking a tissue sample (biopsy) from the stomach to examine under a microscope. TREATMENT  Depending on the cause of your gastritis, medicines may be prescribed. If you have a bacterial infection, such as an H pylori infection, antibiotics may be given. If your gastritis is caused by too much acid in the stomach, H2 blockers or antacids may be given. Your caregiver may recommend that you stop taking aspirin, ibuprofen, or other nonsteroidal anti-inflammatory drugs (NSAIDs). HOME CARE INSTRUCTIONS  Only take over-the-counter or prescription medicines as directed by your caregiver.  If you were given antibiotic medicines, take them as directed. Finish them even if you start to feel better.  Drink enough fluids to keep your urine clear or pale yellow.  Avoid foods and drinks that make your symptoms worse, such as:  Caffeine or alcoholic drinks.  Chocolate.  Peppermint or mint flavorings.  Garlic and onions.  Spicy foods.  Citrus fruits, such as oranges, lemons, or limes.  Tomato-based foods such as sauce, chili, salsa, and pizza.  Fried and fatty foods.  Eat small, frequent meals instead of large meals. SEEK IMMEDIATE MEDICAL CARE IF:   You have black or dark red stools.  You vomit blood or material  that looks like coffee grounds.  You are unable to keep fluids down.  Your abdominal pain gets worse.  You have a fever.  You do not feel better after 1 week.  You have any other questions or concerns. MAKE SURE YOU:  Understand these instructions.  Will watch your condition.  Will get help right away if you are not doing well or get worse.   This information is not intended to replace advice given to you by your health care provider. Make sure you discuss any questions you have with your health care provider.   Document Released: 07/18/2001 Document Revised: 01/23/2012 Document Reviewed: 09/06/2011 Elsevier Interactive Patient Education [employer]

## 2016-03-10 ENCOUNTER — Other Ambulatory Visit: Payer: Self-pay | Admitting: Gastroenterology

## 2016-03-10 DIAGNOSIS — B182 Chronic viral hepatitis C: Secondary | ICD-10-CM

## 2016-04-03 ENCOUNTER — Other Ambulatory Visit: Payer: Self-pay | Admitting: Anesthesiology

## 2016-04-03 DIAGNOSIS — M545 Low back pain: Secondary | ICD-10-CM

## 2016-04-12 ENCOUNTER — Ambulatory Visit: Admission: RE | Admit: 2016-04-12 | Payer: Medicaid Other | Source: Ambulatory Visit

## 2016-04-25 ENCOUNTER — Ambulatory Visit: Admission: RE | Admit: 2016-04-25 | Payer: Medicaid Other | Source: Ambulatory Visit

## 2016-05-08 ENCOUNTER — Ambulatory Visit: Payer: Medicaid Other

## 2016-09-28 ENCOUNTER — Encounter: Payer: Self-pay | Admitting: Emergency Medicine

## 2016-09-28 ENCOUNTER — Emergency Department
Admission: EM | Admit: 2016-09-28 | Discharge: 2016-09-28 | Disposition: A | Discharge status: Home / Self Care | Payer: Medicaid Other | Attending: Emergency Medicine | Admitting: Emergency Medicine

## 2016-09-28 DIAGNOSIS — R05 Cough: Secondary | ICD-10-CM | POA: Diagnosis present

## 2016-09-28 DIAGNOSIS — J45909 Unspecified asthma, uncomplicated: Secondary | ICD-10-CM | POA: Diagnosis not present

## 2016-09-28 DIAGNOSIS — J449 Chronic obstructive pulmonary disease, unspecified: Secondary | ICD-10-CM | POA: Diagnosis not present

## 2016-09-28 DIAGNOSIS — I10 Essential (primary) hypertension: Secondary | ICD-10-CM | POA: Insufficient documentation

## 2016-09-28 DIAGNOSIS — J988 Other specified respiratory disorders: Secondary | ICD-10-CM | POA: Insufficient documentation

## 2016-09-28 DIAGNOSIS — F17210 Nicotine dependence, cigarettes, uncomplicated: Secondary | ICD-10-CM | POA: Insufficient documentation

## 2016-09-28 DIAGNOSIS — B9789 Other viral agents as the cause of diseases classified elsewhere: Secondary | ICD-10-CM

## 2016-09-28 NOTE — ED Triage Notes (Signed)
Patient ambulatory to triage with steady gait, without difficulty or distress noted, mask in place; pt reports chills, generalized HA, runny nose & congestion since yesterday

## 2016-09-28 NOTE — ED Notes (Signed)
Pt discharged to home.  Discharge instructions reviewed.  Verbalized understanding.  No questions or concerns at this time.  Teach back verified.  Pt in NAD.  No items left in ED.   

## 2016-09-28 NOTE — Discharge Instructions (Signed)

## 2016-09-28 NOTE — ED Provider Notes (Signed)
[facility] Emergency Department Provider Note  ____________________________________________   First MD Initiated Contact with Patient 09/28/16 [phone]     (approximate)  I have reviewed the triage vital signs and the nursing notes.   HISTORY  Chief Complaint Nasal Congestion; Cough; and Headache    HPI Susan [name] is a 46 y.o. female with a medical history as listed below who is a current tobacco user with a diagnosis of mild COPD who presents for evaluation of upper respiratory infection symptoms 1 day.  She also checked and her 44-year-old daughter with similar symptoms.  The daughter symptoms started about 3 days ago and the patient's started yesterday.  She describes moderate severity body aches, nasal congestion, runny nose, mild sore throat, chills, subjective fever, and a generalized headache.  Nothing makes it better and nothing makes it worse, but she has not actually tried taking any medication such as ibuprofen or Tylenol.  She is not having any difficulty breathing and does not have any cough more than her baseline.  She is not having any difficulty swallowing.  She denies chest pain, nausea, vomiting, abdominal pain.   Past Medical History:  Diagnosis Date  . Allergy   . Anemia   . Anxiety   . Asthma   . Cirrhosis (HCC)   . COPD (chronic obstructive pulmonary disease) (HCC) 12/09/2014  . Depression   . Emphysema of lung (HCC)   . GERD (gastroesophageal reflux disease)   . Heart murmur   . Hepatitis C    pt said they told her she had this last visit here  . Hyperlipidemia   . Hypertension     Patient Active Problem List   Diagnosis Date Noted  . Elevated LFTs 04/02/2015  . Hepatic cirrhosis (HCC) 12/12/2014  . Diarrhea 12/09/2014  . Blood in stool 12/09/2014  . Abdominal pain 12/09/2014  . COPD (chronic obstructive pulmonary disease) (HCC) 12/09/2014    Past Surgical History:  Procedure Laterality Date  . CESAREAN SECTION  2009      Prior to Admission medications   Medication Sig Start Date End Date Taking? Authorizing Provider  albuterol (PROVENTIL HFA;VENTOLIN HFA) 108 (90 BASE) MCG/ACT inhaler Inhale 1 puff into the lungs every 4 (four) hours as needed for wheezing or shortness of breath.     Historical Provider, MD  azithromycin (ZITHROMAX Z-PAK) 250 MG tablet Take 2 tablets (500 mg) on  Day 1,  followed by 1 tablet (250 mg) once daily on Days 2 through 5. 12/06/15   Sharman CheekPhillip Stafford, MD  budesonide-formoterol [facility](SYMBICORT) 80-4.5 MCG/ACT inhaler Inhale 2 puffs into the lungs 2 (two) times daily.     Historical Provider, MD  clonazePAM (KLONOPIN) 1 MG tablet Take 1 mg by mouth 2 (two) times daily.     Historical Provider, MD  diazepam (VALIUM) 5 MG tablet Take 1 tablet (5 mg total) by mouth every 8 (eight) hours as needed for muscle spasms. Patient not taking: Reported on 07/07/2015 05/29/15   Sharman CheekPhillip Stafford, MD  escitalopram (LEXAPRO) 20 MG tablet Take 20 mg by mouth at bedtime.     Historical Provider, MD  famotidine (PEPCID) 20 MG tablet Take 1 tablet (20 mg total) by mouth 2 (two) times daily. 12/06/15   Sharman CheekPhillip Stafford, MD  furosemide (LASIX) 40 MG tablet Take 40 mg by mouth 2 (two) times daily.     Historical Provider, MD  hydrOXYzine (ATARAX/VISTARIL) 25 MG tablet Take 1 tablet (25 mg total) by mouth 3 (three) times  daily as needed (itching). Patient taking differently: Take 25 mg by mouth 3 (three) times daily as needed for itching.  04/07/15   Srikar Sudini, MD  ipratropium-albuterol (DUONEB) 0.5-2.5 (3) MG/3ML SOLN Take 3 mLs by nebulization every 6 (six) hours as needed (for shortness of breath/wheezing).     Historical Provider, MD  loratadine (CLARITIN) 10 MG tablet Take 10 mg by mouth daily.     Historical Provider, MD  metoCLOPramide (REGLAN) 10 MG tablet Take 1 tablet (10 mg total) by mouth 4 (four) times daily -  before meals and at bedtime. 12/06/15   Sharman Cheek, MD  naproxen (NAPROSYN) 500 MG tablet  Take 1 tablet (500 mg total) by mouth 2 (two) times daily with a meal. 12/06/15   Sharman Cheek, MD  omeprazole (PRILOSEC) 40 MG capsule Take 40 mg by mouth daily.     Historical Provider, MD  pantoprazole (PROTONIX) 20 MG tablet Take 1 tablet (20 mg total) by mouth daily. 07/07/15 07/06/16  Jennye Moccasin, MD  Potassium Chloride ER 20 MEQ TBCR Take 40 mEq by mouth daily.  09/28/14   Historical Provider, MD  predniSONE (DELTASONE) 20 MG tablet Take 2 tablets (40 mg total) by mouth daily. 12/06/15   Sharman Cheek, MD  spironolactone (ALDACTONE) 100 MG tablet Take 100 mg by mouth daily.     Historical Provider, MD  traMADol (ULTRAM) 50 MG tablet Take 1 tablet (50 mg total) by mouth every 6 (six) hours as needed. 07/07/15   Jennye Moccasin, MD    Allergies Doxycycline; Latex; Promethazine; Ranitidine; and Zofran [ondansetron hcl]  Family History  Problem Relation Age of Onset  . Other Mother   . Lung cancer Mother   . Skin cancer Father     Social History Social History  Substance Use Topics  . Smoking status: Current Every Day Smoker    Packs/day: 1.00    Years: 20.00    Types: Cigarettes  . Smokeless tobacco: Never Used  . Alcohol use No     Comment: Quit drinking few years ago.    Review of Systems Constitutional: Subjective fever/chills, myalgias, fatigue Eyes: No visual changes. ENT: Mild sore throat, nasal congestion and runny nose Cardiovascular: Denies chest pain. Respiratory: Denies shortness of breath. Gastrointestinal: No abdominal pain.  No nausea, no vomiting.  No diarrhea.  No constipation. Genitourinary: Negative for dysuria. Musculoskeletal: Negative for back pain. Skin: Negative for rash. Neurological: Generalized headache, no focal numbness nor weakness in her extremities  10-point ROS otherwise negative.  ____________________________________________   PHYSICAL EXAM:  VITAL SIGNS: ED Triage Vitals  Enc Vitals Group     BP 09/28/16 0524 128/89      Pulse Rate 09/28/16 0524 80     Resp 09/28/16 0524 20     Temp 09/28/16 0524 99.1 F (37.3 C)     Temp Source 09/28/16 0524 Oral     SpO2 09/28/16 0524 96 %     Weight 09/28/16 0522 167 lb (75.8 kg)     Height 09/28/16 0522 5\' 3"  (1.6 m)     Head Circumference --      Peak Flow --      Pain Score 09/28/16 0522 8     Pain Loc --      Pain Edu? --      Excl. in GC? --     Constitutional: Alert and oriented. Well appearing and in no acute distress. Eyes: Conjunctivae are normal. PERRL. EOMI. Head: Atraumatic. Nose: +congestion/rhinnorhea. Mouth/Throat:  Mucous membranes are moist.  Oropharynx non-erythematous And without exudate Neck: No stridor.  No meningeal signs.   Cardiovascular: Normal rate, regular rhythm. Good peripheral circulation. Grossly normal heart sounds. Respiratory: Normal respiratory effort.  No retractions. Lungs CTAB. Gastrointestinal: Soft and nontender. No distention.  Musculoskeletal: No lower extremity tenderness nor edema. No gross deformities of extremities. Neurologic:  Normal speech and language. No gross focal neurologic deficits are appreciated.  Skin:  Skin is warm, dry and intact. No rash noted. Psychiatric: Mood and affect are normal. Speech and behavior are normal.  ____________________________________________   LABS (all labs ordered are listed, but only abnormal results are displayed)  Labs Reviewed - No data to display ____________________________________________  EKG  None - EKG not ordered by ED physician ____________________________________________  RADIOLOGY   No results found.  ____________________________________________   PROCEDURES  Procedure(s) performed:   Procedures   Critical Care performed:  ____________________________________________   INITIAL IMPRESSION / ASSESSMENT AND PLAN / ED COURSE  Pertinent labs & imaging results that were available during my care of the patient were reviewed by me and considered  in my medical decision making (see chart for details).  Reassuring physical exam and vital signs.  The patient has a presentation consistent with viral illness, possibly influenza, but there is no indication for testing at this time given almost no evidence the Tamiflu is effective.  I discussed symptomatic treatment and outpatient follow-up.  The patient understands and agrees the plan.  Her lungs are clear at this time and there is no indication for chest x-ray.     ____________________________________________  FINAL CLINICAL IMPRESSION(S) / ED DIAGNOSES  Final diagnoses:  Viral respiratory infection     MEDICATIONS GIVEN DURING THIS VISIT:  Medications - No data to display   NEW OUTPATIENT MEDICATIONS STARTED DURING THIS VISIT:  New Prescriptions   No medications on file    Modified Medications   No medications on file    Discontinued Medications   No medications on file     Note:  This document was prepared using Dragon voice recognition software and may include unintentional dictation errors.    Loleta Rose, MD 09/28/16 [phone]

## 2017-01-04 ENCOUNTER — Emergency Department
Admission: EM | Admit: 2017-01-04 | Discharge: 2017-01-04 | Disposition: A | Discharge status: Home / Self Care | Payer: Medicaid Other | Attending: Emergency Medicine | Admitting: Emergency Medicine

## 2017-01-04 ENCOUNTER — Encounter: Payer: Self-pay | Admitting: Emergency Medicine

## 2017-01-04 ENCOUNTER — Emergency Department: Payer: Medicaid Other

## 2017-01-04 DIAGNOSIS — F17210 Nicotine dependence, cigarettes, uncomplicated: Secondary | ICD-10-CM | POA: Diagnosis not present

## 2017-01-04 DIAGNOSIS — N611 Abscess of the breast and nipple: Secondary | ICD-10-CM | POA: Diagnosis not present

## 2017-01-04 DIAGNOSIS — J45909 Unspecified asthma, uncomplicated: Secondary | ICD-10-CM | POA: Diagnosis not present

## 2017-01-04 DIAGNOSIS — N644 Mastodynia: Secondary | ICD-10-CM | POA: Diagnosis present

## 2017-01-04 DIAGNOSIS — Z91040 Latex allergy status: Secondary | ICD-10-CM | POA: Insufficient documentation

## 2017-01-04 DIAGNOSIS — Z79899 Other long term (current) drug therapy: Secondary | ICD-10-CM | POA: Insufficient documentation

## 2017-01-04 DIAGNOSIS — N61 Mastitis without abscess: Secondary | ICD-10-CM

## 2017-01-04 DIAGNOSIS — I10 Essential (primary) hypertension: Secondary | ICD-10-CM | POA: Diagnosis not present

## 2017-01-04 DIAGNOSIS — J449 Chronic obstructive pulmonary disease, unspecified: Secondary | ICD-10-CM | POA: Insufficient documentation

## 2017-01-04 DIAGNOSIS — B999 Unspecified infectious disease: Secondary | ICD-10-CM

## 2017-01-04 LAB — BLOOD GAS, VENOUS
Acid-base deficit: 0.5 mmol/L (ref 0.0–2.0)
BICARBONATE: 25.4 mmol/L (ref 20.0–28.0)
O2 Saturation: 60.6 %
PH VEN: 7.36 (ref 7.250–7.430)
PO2 VEN: 33 mmHg (ref 32.0–45.0)
Patient temperature: 37
pCO2, Ven: 45 mmHg (ref 44.0–60.0)

## 2017-01-04 LAB — COMPREHENSIVE METABOLIC PANEL
ALK PHOS: 100 U/L (ref 38–126)
ALT: 258 U/L — AB (ref 14–54)
AST: 459 U/L — ABNORMAL HIGH (ref 15–41)
Albumin: 4.3 g/dL (ref 3.5–5.0)
Anion gap: 16 — ABNORMAL HIGH (ref 5–15)
BUN: <5 mg/dL — ABNORMAL LOW (ref 6–20)
CALCIUM: 9 mg/dL (ref 8.9–10.3)
CO2: 17 mmol/L — AB (ref 22–32)
CREATININE: 0.42 mg/dL — AB (ref 0.44–1.00)
Chloride: 105 mmol/L (ref 101–111)
GFR calc non Af Amer: >60 mL/min (ref >60)
Glucose, Bld: 115 mg/dL — ABNORMAL HIGH (ref 65–99)
Potassium: 3.9 mmol/L (ref 3.5–5.1)
SODIUM: 138 mmol/L (ref 135–145)
Total Bilirubin: 1.3 mg/dL — ABNORMAL HIGH (ref 0.3–1.2)
Total Protein: 8.3 g/dL — ABNORMAL HIGH (ref 6.5–8.1)

## 2017-01-04 LAB — CBC
HEMATOCRIT: 46 % (ref 35.0–47.0)
HEMOGLOBIN: 16 g/dL (ref 12.0–16.0)
MCH: 34.2 pg — ABNORMAL HIGH (ref 26.0–34.0)
MCHC: 34.9 g/dL (ref 32.0–36.0)
MCV: 98.2 fL (ref 80.0–100.0)
Platelets: 126 10*3/uL — ABNORMAL LOW (ref 150–440)
RBC: 4.69 MIL/uL (ref 3.80–5.20)
RDW: 13.2 % (ref 11.5–14.5)
WBC: 5.7 10*3/uL (ref 3.6–11.0)

## 2017-01-04 LAB — TROPONIN I: Troponin I: <0.03 ng/mL (ref <0.03)

## 2017-01-04 MED ORDER — OXYCODONE HCL 5 MG PO TABS: 5.0000 mg | ORAL_TABLET | Freq: Three times a day (TID) | ORAL | 0 refills | Status: DC | PRN

## 2017-01-04 MED ORDER — SULFAMETHOXAZOLE-TRIMETHOPRIM 800-160 MG PO TABS: 1.0000 | ORAL_TABLET | Freq: Two times a day (BID) | ORAL | 0 refills | Status: DC

## 2017-01-04 MED ORDER — MORPHINE SULFATE (PF) 4 MG/ML IV SOLN
INTRAVENOUS | Status: AC
  Filled 2017-01-04: qty 1

## 2017-01-04 MED ORDER — MORPHINE SULFATE (PF) 4 MG/ML IV SOLN
4.0000 mg | Freq: Once | INTRAVENOUS | Status: AC
  Administered 2017-01-04: 4 mg via INTRAVENOUS

## 2017-01-04 MED ORDER — ONDANSETRON HCL 4 MG/2ML IJ SOLN
INTRAMUSCULAR | Status: AC
  Filled 2017-01-04: qty 2

## 2017-01-04 NOTE — ED Notes (Signed)
Patient transported to US 

## 2017-01-04 NOTE — ED Notes (Signed)
Patient transported to radiology

## 2017-01-04 NOTE — ED Notes (Signed)
Patient refused Zofran prior to morphine administration.

## 2017-01-04 NOTE — ED Provider Notes (Signed)
[facility] Emergency Department Provider Note   ____________________________________________    I have reviewed the triage vital signs and the nursing notes.   HISTORY  Chief Complaint Rash and Chest Pain     HPI Susan [name] is a 46 y.o. female who presents with multiple complaints but her primary complaint seems to be right breast pain. Patient reports redness that developed 4 days ago and tenderness to palpation which has worsened over the last 2 days. She has not taken anything for this. She denies a history of infection in her breast. She does get regular mammograms. Denies fevers or chills. She also reports discomfort throughout her whole chest which she thinks is secondary to her breast pain. She also reports pain extends from her breast all the way down her abdomen and into her right leg. No shortness of breath. No discharge from nipple   Past Medical History:  Diagnosis Date  . Allergy   . Anemia   . Anxiety   . Asthma   . Cirrhosis (HCC)   . COPD (chronic obstructive pulmonary disease) (HCC) 12/09/2014  . Depression   . Emphysema of lung (HCC)   . GERD (gastroesophageal reflux disease)   . Heart murmur   . Hepatitis C    pt said they told her she had this last visit here  . Hyperlipidemia   . Hypertension     Patient Active Problem List   Diagnosis Date Noted  . Elevated LFTs 04/02/2015  . Hepatic cirrhosis (HCC) 12/12/2014  . Diarrhea 12/09/2014  . Blood in stool 12/09/2014  . Abdominal pain 12/09/2014  . COPD (chronic obstructive pulmonary disease) (HCC) 12/09/2014    Past Surgical History:  Procedure Laterality Date  . CESAREAN SECTION  2009    Prior to Admission medications   Medication Sig Start Date End Date Taking? Authorizing Provider  albuterol (PROVENTIL HFA;VENTOLIN HFA) 108 (90 BASE) MCG/ACT inhaler Inhale 1 puff into the lungs every 4 (four) hours as needed for wheezing or shortness of breath.     [provider]  azithromycin (ZITHROMAX Z-PAK) 250 MG tablet Take 2 tablets (500 mg) on  Day 1,  followed by 1 tablet (250 mg) once daily on Days 2 through 5. Patient not taking: Reported on 01/04/2017 12/06/15   Sharman Cheek, MD  budesonide-formoterol [facility]) 80-4.5 MCG/ACT inhaler Inhale 2 puffs into the lungs 2 (two) times daily.     [provider]  clonazePAM (KLONOPIN) 1 MG tablet Take 1 mg by mouth 2 (two) times daily.     [provider]  diazepam (VALIUM) 5 MG tablet Take 1 tablet (5 mg total) by mouth every 8 (eight) hours as needed for muscle spasms. Patient not taking: Reported on 01/04/2017 05/29/15   Sharman Cheek, MD  escitalopram (LEXAPRO) 20 MG tablet Take 20 mg by mouth at bedtime.     [provider]  famotidine (PEPCID) 20 MG tablet Take 1 tablet (20 mg total) by mouth 2 (two) times daily. Patient not taking: Reported on 01/04/2017 12/06/15   Sharman Cheek, MD  furosemide (LASIX) 40 MG tablet Take 40 mg by mouth 2 (two) times daily.     [provider]  hydrOXYzine (ATARAX/VISTARIL) 25 MG tablet Take 1 tablet (25 mg total) by mouth 3 (three) times daily as needed (itching). Patient not taking: Reported on 01/04/2017 04/07/15   Milagros Loll, MD  ipratropium-albuterol (DUONEB) 0.5-2.5 (3) MG/3ML SOLN Take 3 mLs by nebulization every 6 (six)  hours as needed (for shortness of breath/wheezing).     [provider]  loratadine (CLARITIN) 10 MG tablet Take 10 mg by mouth daily.     [provider]  omeprazole (PRILOSEC) 40 MG capsule Take 40 mg by mouth daily.     [provider]  oxyCODONE (ROXICODONE) 5 MG immediate release tablet Take 1 tablet (5 mg total) by mouth every 8 (eight) hours as needed. 01/04/17 01/04/18  Jene Every, MD  pantoprazole (PROTONIX) 20 MG tablet Take 1 tablet (20 mg total) by mouth daily. Patient not taking: Reported on 01/04/2017 07/07/15 07/06/16  Jennye Moccasin, MD  Potassium  Chloride ER 20 MEQ TBCR Take 40 mEq by mouth daily.  09/28/14   [provider]  predniSONE (DELTASONE) 20 MG tablet Take 2 tablets (40 mg total) by mouth daily. Patient not taking: Reported on 01/04/2017 12/06/15   Sharman Cheek, MD  spironolactone (ALDACTONE) 100 MG tablet Take 100 mg by mouth daily.     [provider]  sulfamethoxazole-trimethoprim (BACTRIM DS,SEPTRA DS) 800-160 MG tablet Take 1 tablet by mouth 2 (two) times daily. 01/04/17   Jene Every, MD     Allergies Doxycycline; Latex; Promethazine; Ranitidine; and Zofran [ondansetron hcl]  Family History  Problem Relation Age of Onset  . Other Mother   . Lung cancer Mother   . Skin cancer Father     Social History Social History  Substance Use Topics  . Smoking status: Current Every Day Smoker    Packs/day: 1.00    Years: 20.00    Types: Cigarettes  . Smokeless tobacco: Never Used  . Alcohol use No     Comment: Quit drinking few years ago.    Review of Systems  Constitutional: No fever/chills Eyes: No visual changes.  ENT: No sore throat. Cardiovascular:As above Respiratory: Denies shortness of breath. Gastrointestinal: No nausea, no vomiting.   Genitourinary: Negative for dysuria. Musculoskeletal: Negative for back pain. Skin: Negative for rash. Neurological: Negative for headaches    ____________________________________________   PHYSICAL EXAM:  VITAL SIGNS: ED Triage Vitals [01/04/17 0830]  Enc Vitals Group     BP (!) 135/94     Pulse Rate (!) 101     Resp 15     Temp 98.1 F (36.7 C)     Temp Source Oral     SpO2 97 %     Weight 74.8 kg (165 lb)     Height 1.6 m (5\' 3" )     Head Circumference      Peak Flow      Pain Score 10     Pain Loc      Pain Edu?      Excl. in GC?     Constitutional: Alert and oriented. No acute distress.Anxious Eyes: Conjunctivae are normal. PERRLA, EOMI  Nose: No congestion/rhinnorhea. Mouth/Throat: Mucous membranes are moist.   Neck:   Painless ROM Cardiovascular: Normal rate, regular rhythm. Grossly normal heart sounds.  Good peripheral circulation. Right breast: Mild erythema at the 3:00 position, some tenderness to palpation but no clear fluctuance underlying. No nipple discharge. No orange peel appearance or masses felt Respiratory: Normal respiratory effort.  No retractions. Lungs CTAB. Gastrointestinal: Soft and nontender. No distention.  No CVA tenderness. Genitourinary: deferred Musculoskeletal: No lower extremity tenderness nor edema.  Warm and well perfused Neurologic:  Normal speech and language. No gross focal neurologic deficits are appreciated.  Skin:  Skin is warm, dry and intact. No rash noted. Psychiatric: Mood and  affect are normal. Speech and behavior are normal.  ____________________________________________   LABS (all labs ordered are listed, but only abnormal results are displayed)  Labs Reviewed  CBC - Abnormal; Notable for the following:       Result Value   MCH 34.2 (*)    Platelets 126 (*)    All other components within normal limits  COMPREHENSIVE METABOLIC PANEL - Abnormal; Notable for the following:    CO2 17 (*)    Glucose, Bld 115 (*)    BUN <5 (*)    Creatinine, Ser 0.42 (*)    Total Protein 8.3 (*)    AST 459 (*)    ALT 258 (*)    Total Bilirubin 1.3 (*)    Anion gap 16 (*)    All other components within normal limits  AEROBIC/ANAEROBIC CULTURE (SURGICAL/DEEP WOUND)  TROPONIN I  BLOOD GAS, VENOUS   ____________________________________________  EKG  ED ECG REPORT I, Jene EveryKINNER, Meia Emley, the attending physician, personally viewed and interpreted this ECG.  Date: 01/04/2017 EKG Time: 8:27 AM Rate: 93 Rhythm: normal sinus rhythm QRS Axis: normal Intervals: normal ST/T Wave abnormalities: normal Conduction Disturbances: none Narrative Interpretation: unremarkable  ____________________________________________  RADIOLOGY  Chest x-ray  unremarkable ____________________________________________   PROCEDURES  Procedure(s) performed: No    Critical Care performed: No ____________________________________________   INITIAL IMPRESSION / ASSESSMENT AND PLAN / ED COURSE  Pertinent labs & imaging results that were available during my care of the patient were reviewed by me and considered in my medical decision making (see chart for details).  Given erythema and tenderness overlying the right breast, we will obtain ultrasound to rule out abscess.  ----------------------------------------- 10:32 AM on 01/04/2017 -----------------------------------------  Notified by radiologist of abscess, which he will drain  ----------------------------------------- 12:03 PM on 01/04/2017 -----------------------------------------  Patient with bicarbonate of 17 looking back at her labs in the past for bicarbonate does run low, we will check a VBG  ----------------------------------------- 12:55 PM on 01/04/2017 -----------------------------------------  PH normal. Patient with elevated AST ALT secondary to her chronic alcoholic cirrhosis     ____________________________________________   FINAL CLINICAL IMPRESSION(S) / ED DIAGNOSES  Final diagnoses:  Infection  Breast abscess      NEW MEDICATIONS STARTED DURING THIS VISIT:  New Prescriptions   OXYCODONE (ROXICODONE) 5 MG IMMEDIATE RELEASE TABLET    Take 1 tablet (5 mg total) by mouth every 8 (eight) hours as needed.   SULFAMETHOXAZOLE-TRIMETHOPRIM (BACTRIM DS,SEPTRA DS) 800-160 MG TABLET    Take 1 tablet by mouth 2 (two) times daily.     Note:  This document was prepared using Dragon voice recognition software and may include unintentional dictation errors.    Jene EveryKinner, Taino Maertens, MD 01/04/17 1255

## 2017-01-04 NOTE — ED Triage Notes (Signed)
Patient presents to ED via [facility] from home with c/o CP x6 months and right breast pain x 4 days. Redness noted to right breast around nipple. No drainage noted. Tender upon palpation. A&O x4.

## 2017-01-09 LAB — AEROBIC/ANAEROBIC CULTURE (SURGICAL/DEEP WOUND)

## 2017-01-09 LAB — AEROBIC/ANAEROBIC CULTURE W GRAM STAIN (SURGICAL/DEEP WOUND)

## 2017-01-10 NOTE — Progress Notes (Signed)
ED Antimicrobial Stewardship Positive Culture Follow Up   Susan [name] is an 46 y.o. female who presented to [facility] on 01/04/2017 with a chief complaint of  Chief Complaint  Patient presents with  . Rash  . Chest Pain    Recent Results (from the past 720 hour(s))  Aerobic/Anaerobic Culture (surgical/deep wound)     Status: None   Collection Time: 01/04/17 10:55 AM  Result Value Ref Range Status   Specimen Description BREAST  Final   Special Requests NONE  Final   Gram Stain   Final    ABUNDANT WBC PRESENT, PREDOMINANTLY PMN ABUNDANT GRAM POSITIVE COCCI IN PAIRS Performed at [facility], [address] [place], [place] [address]    Culture   Final    MODERATE STAPHYLOCOCCUS SPECIES (COAGULASE NEGATIVE) ABUNDANT FINEGOLDIA MAGNA    Report Status 01/09/2017 FINAL  Final   Organism ID, Bacteria STAPHYLOCOCCUS SPECIES (COAGULASE NEGATIVE)  Final      Susceptibility   Staphylococcus species (coagulase negative) - MIC*    CIPROFLOXACIN <=0.5 SENSITIVE Sensitive     ERYTHROMYCIN <=0.25 SENSITIVE Sensitive     GENTAMICIN <=0.5 SENSITIVE Sensitive     OXACILLIN <=0.25 SENSITIVE Sensitive     TETRACYCLINE <=1 SENSITIVE Sensitive     VANCOMYCIN 1 SENSITIVE Sensitive     TRIMETH/SULFA <=10 SENSITIVE Sensitive     CLINDAMYCIN <=0.25 SENSITIVE Sensitive     RIFAMPIN <=0.5 SENSITIVE Sensitive     Inducible Clindamycin NEGATIVE Sensitive     * MODERATE STAPHYLOCOCCUS SPECIES (COAGULASE NEGATIVE)    Patient was seen for breast abscess and discharged on Bactrim DS bid x 7 days. Spoke with ED physician and would like to treat both organisms (CoNS and Finegoldia magna) with clindamycin. I called patient earlier and left voicemail for patient to call pharmacy back (VM was a female with different last name so unsure if this is an accurate number). Called both numbers listed as 'aunt' and both numbers are disconnected. Will try again tomorrow.   New antibiotic prescription:  clindamycin 450 mg QID x 7 days  ED Provider: Dr. Midge Averobinson  Trevis Eden, PharmD Pharmacy Resident 01/10/2017 4:39 PM

## 2017-01-11 ENCOUNTER — Other Ambulatory Visit: Payer: Self-pay | Admitting: Internal Medicine

## 2017-01-11 DIAGNOSIS — N6310 Unspecified lump in the right breast, unspecified quadrant: Secondary | ICD-10-CM

## 2017-02-27 ENCOUNTER — Emergency Department: Payer: Medicaid Other

## 2017-02-27 ENCOUNTER — Encounter: Payer: Self-pay | Admitting: Emergency Medicine

## 2017-02-27 ENCOUNTER — Emergency Department
Admission: EM | Admit: 2017-02-27 | Discharge: 2017-02-27 | Disposition: A | Discharge status: Home / Self Care | Payer: Medicaid Other | Attending: Emergency Medicine | Admitting: Emergency Medicine

## 2017-02-27 DIAGNOSIS — Z23 Encounter for immunization: Secondary | ICD-10-CM | POA: Diagnosis not present

## 2017-02-27 DIAGNOSIS — W19XXXA Unspecified fall, initial encounter: Secondary | ICD-10-CM

## 2017-02-27 DIAGNOSIS — F17210 Nicotine dependence, cigarettes, uncomplicated: Secondary | ICD-10-CM | POA: Diagnosis not present

## 2017-02-27 DIAGNOSIS — F10920 Alcohol use, unspecified with intoxication, uncomplicated: Secondary | ICD-10-CM | POA: Diagnosis not present

## 2017-02-27 DIAGNOSIS — Y999 Unspecified external cause status: Secondary | ICD-10-CM | POA: Diagnosis not present

## 2017-02-27 DIAGNOSIS — Z91040 Latex allergy status: Secondary | ICD-10-CM | POA: Diagnosis not present

## 2017-02-27 DIAGNOSIS — J449 Chronic obstructive pulmonary disease, unspecified: Secondary | ICD-10-CM | POA: Diagnosis not present

## 2017-02-27 DIAGNOSIS — I10 Essential (primary) hypertension: Secondary | ICD-10-CM | POA: Diagnosis not present

## 2017-02-27 DIAGNOSIS — S0990XA Unspecified injury of head, initial encounter: Secondary | ICD-10-CM | POA: Diagnosis present

## 2017-02-27 DIAGNOSIS — Y939 Activity, unspecified: Secondary | ICD-10-CM | POA: Diagnosis not present

## 2017-02-27 DIAGNOSIS — J45909 Unspecified asthma, uncomplicated: Secondary | ICD-10-CM | POA: Diagnosis not present

## 2017-02-27 DIAGNOSIS — W01198A Fall on same level from slipping, tripping and stumbling with subsequent striking against other object, initial encounter: Secondary | ICD-10-CM | POA: Insufficient documentation

## 2017-02-27 DIAGNOSIS — Z79899 Other long term (current) drug therapy: Secondary | ICD-10-CM | POA: Insufficient documentation

## 2017-02-27 DIAGNOSIS — Y929 Unspecified place or not applicable: Secondary | ICD-10-CM | POA: Insufficient documentation

## 2017-02-27 DIAGNOSIS — S0101XA Laceration without foreign body of scalp, initial encounter: Secondary | ICD-10-CM | POA: Diagnosis not present

## 2017-02-27 DIAGNOSIS — K7469 Other cirrhosis of liver: Secondary | ICD-10-CM | POA: Insufficient documentation

## 2017-02-27 LAB — PROTIME-INR
INR: 0.99
Prothrombin Time: 13.1 seconds (ref 11.4–15.2)

## 2017-02-27 LAB — COMPREHENSIVE METABOLIC PANEL
ALBUMIN: 4.2 g/dL (ref 3.5–5.0)
ALK PHOS: 127 U/L — AB (ref 38–126)
ALT: 285 U/L — ABNORMAL HIGH (ref 14–54)
ANION GAP: 10 (ref 5–15)
AST: 430 U/L — ABNORMAL HIGH (ref 15–41)
BUN: 5 mg/dL — ABNORMAL LOW (ref 6–20)
CALCIUM: 9 mg/dL (ref 8.9–10.3)
CO2: 25 mmol/L (ref 22–32)
CREATININE: 0.55 mg/dL (ref 0.44–1.00)
Chloride: 103 mmol/L (ref 101–111)
GFR calc Af Amer: >60 mL/min (ref >60)
GFR calc non Af Amer: >60 mL/min (ref >60)
GLUCOSE: 130 mg/dL — AB (ref 65–99)
Potassium: 3.5 mmol/L (ref 3.5–5.1)
SODIUM: 138 mmol/L (ref 135–145)
Total Bilirubin: 0.8 mg/dL (ref 0.3–1.2)
Total Protein: 8.4 g/dL — ABNORMAL HIGH (ref 6.5–8.1)

## 2017-02-27 LAB — CBC WITH DIFFERENTIAL/PLATELET
Basophils Absolute: 0.1 10*3/uL (ref 0–0.1)
Basophils Relative: 1 %
EOS ABS: 0.2 10*3/uL (ref 0–0.7)
Eosinophils Relative: 3 %
HCT: 43.6 % (ref 35.0–47.0)
HEMOGLOBIN: 15.5 g/dL (ref 12.0–16.0)
Lymphocytes Relative: 46 %
Lymphs Abs: 3.9 10*3/uL — ABNORMAL HIGH (ref 1.0–3.6)
MCH: 36.1 pg — ABNORMAL HIGH (ref 26.0–34.0)
MCHC: 35.6 g/dL (ref 32.0–36.0)
MCV: 101.7 fL — ABNORMAL HIGH (ref 80.0–100.0)
Monocytes Absolute: 0.7 10*3/uL (ref 0.2–0.9)
Monocytes Relative: 8 %
NEUTROS PCT: 42 %
Neutro Abs: 3.5 10*3/uL (ref 1.4–6.5)
Platelets: 134 10*3/uL — ABNORMAL LOW (ref 150–440)
RBC: 4.29 MIL/uL (ref 3.80–5.20)
RDW: 13.3 % (ref 11.5–14.5)
WBC: 8.3 10*3/uL (ref 3.6–11.0)

## 2017-02-27 LAB — ETHANOL: Alcohol, Ethyl (B): 425 mg/dL (ref <5)

## 2017-02-27 MED ORDER — NICOTINE 21 MG/24HR TD PT24
21.0000 mg | MEDICATED_PATCH | Freq: Once | TRANSDERMAL | Status: DC
  Administered 2017-02-27: 21 mg via TRANSDERMAL

## 2017-02-27 MED ORDER — SODIUM CHLORIDE 0.9 % IV BOLUS (SEPSIS)
1000.0000 mL | Freq: Once | INTRAVENOUS | Status: AC
  Administered 2017-02-27: 1000 mL via INTRAVENOUS

## 2017-02-27 MED ORDER — LIDOCAINE HCL (PF) 1 % IJ SOLN
10.0000 mL | Freq: Once | INTRAMUSCULAR | Status: AC
  Administered 2017-02-27: 10 mL

## 2017-02-27 MED ORDER — NICOTINE 21 MG/24HR TD PT24
MEDICATED_PATCH | TRANSDERMAL | Status: AC
  Administered 2017-02-27: 21 mg via TRANSDERMAL
  Filled 2017-02-27: qty 1

## 2017-02-27 MED ORDER — BACITRACIN ZINC 500 UNIT/GM EX OINT
TOPICAL_OINTMENT | Freq: Two times a day (BID) | CUTANEOUS | Status: DC
  Administered 2017-02-27: 1 via TOPICAL
  Filled 2017-02-27: qty 0.9

## 2017-02-27 MED ORDER — LORAZEPAM 2 MG/ML IJ SOLN
INTRAMUSCULAR | Status: AC
  Administered 2017-02-27: 0.5 mg via INTRAVENOUS
  Filled 2017-02-27: qty 1

## 2017-02-27 MED ORDER — LORAZEPAM 2 MG/ML IJ SOLN
0.5000 mg | Freq: Once | INTRAMUSCULAR | Status: AC
  Administered 2017-02-27: 0.5 mg via INTRAVENOUS

## 2017-02-27 MED ORDER — TRAMADOL HCL 50 MG PO TABS
ORAL_TABLET | ORAL | Status: DC
Start: 2017-02-27 — End: 2017-02-27
  Filled 2017-02-27: qty 1

## 2017-02-27 MED ORDER — TRAMADOL HCL 50 MG PO TABS: 50.0000 mg | ORAL_TABLET | Freq: Once | ORAL | Status: DC

## 2017-02-27 MED ORDER — LIDOCAINE HCL (PF) 1 % IJ SOLN
INTRAMUSCULAR | Status: AC
  Filled 2017-02-27: qty 5

## 2017-02-27 MED ORDER — TETANUS-DIPHTH-ACELL PERTUSSIS 5-2.5-18.5 LF-MCG/0.5 IM SUSP
0.5000 mL | Freq: Once | INTRAMUSCULAR | Status: AC
  Administered 2017-02-27: 0.5 mL via INTRAMUSCULAR
  Filled 2017-02-27: qty 0.5

## 2017-02-27 NOTE — ED Notes (Signed)
edp in to staple laceration noted to left side of head.

## 2017-02-27 NOTE — ED Provider Notes (Signed)
Patient received in sign-out from Dr. Dolores FrameSung.  Workup and evaluation pending further observation and monitoring for patient to become clinically sober.   Marland Kitchen..Laceration Repair Date/Time: 02/27/2017 11:47 AM Performed by: Willy EddyOBINSON, Tina Temme Authorized by: Willy EddyOBINSON, Domino Holten   Consent:    Consent obtained:  Verbal   Consent given by:  Patient   Risks discussed:  Infection, need for additional repair, nerve damage, pain, poor cosmetic result, retained foreign body, tendon damage, vascular damage and poor wound healing Anesthesia (see MAR for exact dosages):    Anesthesia method:  Local infiltration   Local anesthetic:  Lidocaine 1% w/o epi Laceration details:    Location:  Scalp   Scalp location:  L parietal   Length (cm):  3   Depth (mm):  3 Pre-procedure details:    Preparation:  Patient was prepped and draped in usual sterile fashion Exploration:    Wound exploration: wound explored through full range of motion     Contaminated: no   Treatment:    Area cleansed with:  Betadine   Amount of cleaning:  Standard   Irrigation solution:  Sterile saline   Visualized foreign bodies/material removed: no   Skin repair:    Repair method:  Staples   Number of staples:  5 Approximation:    Approximation:  Close   Vermilion border: poorly aligned   Post-procedure details:    Dressing:  Antibiotic ointment   Patient tolerance of procedure:  Tolerated well, no immediate complications    ----------------------------------------- 12:11 PM on 02/27/2017 -----------------------------------------  Patient has been observed in the ER overnight. Does complain of intermittent nausea and headache where wound was repaired but repeat neuro exam is nonfocal. Patient is tolerating oral hydration. I do feel the patient is stable for her to observation as an outpatient. Discussed signs and symptoms for which patient should return immediately. Patient's friend of his, to bedside to take patient  home        Willy Eddyobinson, Dariush Mcnellis, MD 02/27/17 1212

## 2017-02-27 NOTE — ED Triage Notes (Signed)
Pt arrived via ems from home after a fall that resulted in laceration to the left side of pt's head behind ear. Family reported to ems that pt lost consciousness and etoh consumption of unknown amount. Upon arrival pt alert and oriented x 4 and complaints of head pain at a 7 on a 0-10 scale.

## 2017-02-27 NOTE — ED Notes (Signed)
Pt with vomiting clear liquids, got strangled in the process, edp notified and orders to give another liter of NS.  Pt urinated on herself in the bed, pt was assisted to BR, changed and cleaned clothes and bed linens as well. Pt did have a large BM while using the BR. edp aware.  Continue to monitor. Pt advised to call for assistance to go to BR if needed. Yellow skid proof socks applied.

## 2017-02-27 NOTE — ED Provider Notes (Signed)
[facility] Emergency Department Provider Note   ____________________________________________   First MD Initiated Contact with Patient 02/27/17 [phone]     (approximate)  I have reviewed the triage vital signs and the nursing notes.   HISTORY  Chief Complaint Fall  History limited by intoxication  HPI Susan [name] is a 46 y.o. female hot to the ED via EMS from home status post fall with head injury. EMS reports that family stated patient had been consuming unknown quantity of alcohol, fell and lost consciousness. Patient reports she "was playing with the kids", stumbled and fell, striking her head. Does not think she passed out. Complains of head pain only. Denies associated vision changes, neck pain, chest pain, shortness of breath, abdominal pain, nausea, vomiting. Denies extremity weakness, numbness or tingling.   Past Medical History:  Diagnosis Date  . Allergy   . Anemia   . Anxiety   . Asthma   . Cirrhosis (HCC)   . COPD (chronic obstructive pulmonary disease) (HCC) 12/09/2014  . Depression   . Emphysema of lung (HCC)   . GERD (gastroesophageal reflux disease)   . Heart murmur   . Hepatitis C    pt said they told her she had this last visit here  . Hyperlipidemia   . Hypertension     Patient Active Problem List   Diagnosis Date Noted  . Elevated LFTs 04/02/2015  . Hepatic cirrhosis (HCC) 12/12/2014  . Diarrhea 12/09/2014  . Blood in stool 12/09/2014  . Abdominal pain 12/09/2014  . COPD (chronic obstructive pulmonary disease) (HCC) 12/09/2014    Past Surgical History:  Procedure Laterality Date  . CESAREAN SECTION  2009    Prior to Admission medications   Medication Sig Start Date End Date Taking? Authorizing Provider  albuterol (PROVENTIL HFA;VENTOLIN HFA) 108 (90 BASE) MCG/ACT inhaler Inhale 1 puff into the lungs every 4 (four) hours as needed for wheezing or shortness of breath.     [provider]  azithromycin  (ZITHROMAX Z-PAK) 250 MG tablet Take 2 tablets (500 mg) on  Day 1,  followed by 1 tablet (250 mg) once daily on Days 2 through 5. Patient not taking: Reported on 01/04/2017 12/06/15   Sharman Cheek, MD  budesonide-formoterol [facility]) 80-4.5 MCG/ACT inhaler Inhale 2 puffs into the lungs 2 (two) times daily.     [provider]  clonazePAM (KLONOPIN) 1 MG tablet Take 1 mg by mouth 2 (two) times daily.     [provider]  diazepam (VALIUM) 5 MG tablet Take 1 tablet (5 mg total) by mouth every 8 (eight) hours as needed for muscle spasms. Patient not taking: Reported on 01/04/2017 05/29/15   Sharman Cheek, MD  escitalopram (LEXAPRO) 20 MG tablet Take 20 mg by mouth at bedtime.     [provider]  famotidine (PEPCID) 20 MG tablet Take 1 tablet (20 mg total) by mouth 2 (two) times daily. Patient not taking: Reported on 01/04/2017 12/06/15   Sharman Cheek, MD  furosemide (LASIX) 40 MG tablet Take 40 mg by mouth 2 (two) times daily.     [provider]  hydrOXYzine (ATARAX/VISTARIL) 25 MG tablet Take 1 tablet (25 mg total) by mouth 3 (three) times daily as needed (itching). Patient not taking: Reported on 01/04/2017 04/07/15   Milagros Loll, MD  ipratropium-albuterol (DUONEB) 0.5-2.5 (3) MG/3ML SOLN Take 3 mLs by nebulization every 6 (six) hours as needed (for shortness of breath/wheezing).     [provider]  loratadine (  CLARITIN) 10 MG tablet Take 10 mg by mouth daily.     [provider]  omeprazole (PRILOSEC) 40 MG capsule Take 40 mg by mouth daily.     [provider]  oxyCODONE (ROXICODONE) 5 MG immediate release tablet Take 1 tablet (5 mg total) by mouth every 8 (eight) hours as needed. 01/04/17 01/04/18  Jene Every, MD  pantoprazole (PROTONIX) 20 MG tablet Take 1 tablet (20 mg total) by mouth daily. Patient not taking: Reported on 01/04/2017 07/07/15 07/06/16  Jennye Moccasin, MD  Potassium Chloride ER 20 MEQ TBCR Take 40 mEq  by mouth daily.  09/28/14   [provider]  predniSONE (DELTASONE) 20 MG tablet Take 2 tablets (40 mg total) by mouth daily. Patient not taking: Reported on 01/04/2017 12/06/15   Sharman Cheek, MD  spironolactone (ALDACTONE) 100 MG tablet Take 100 mg by mouth daily.     [provider]  sulfamethoxazole-trimethoprim (BACTRIM DS,SEPTRA DS) 800-160 MG tablet Take 1 tablet by mouth 2 (two) times daily. 01/04/17   Jene Every, MD    Allergies Doxycycline; Latex; Promethazine; Ranitidine; and Zofran [ondansetron hcl]  Family History  Problem Relation Age of Onset  . Other Mother   . Lung cancer Mother   . Skin cancer Father     Social History Social History  Substance Use Topics  . Smoking status: Current Every Day Smoker    Packs/day: 1.00    Years: 20.00    Types: Cigarettes  . Smokeless tobacco: Never Used  . Alcohol use No     Comment: Quit drinking few years ago.    Review of Systems  Constitutional: Positive for intoxication and head injury. No fever/chills. Eyes: No visual changes. ENT: No sore throat. Cardiovascular: Denies chest pain. Respiratory: Denies shortness of breath. Gastrointestinal: No abdominal pain.  No nausea, no vomiting.  No diarrhea.  No constipation. Genitourinary: Negative for dysuria. Musculoskeletal: Negative for back pain. Skin: Negative for rash. Neurological: Positive for headache. Negative for focal weakness or numbness.   ____________________________________________   PHYSICAL EXAM:  VITAL SIGNS: ED Triage Vitals  Enc Vitals Group     BP 02/27/17 0251 (!) 152/75     Pulse Rate 02/27/17 0251 100     Resp 02/27/17 0251 18     Temp 02/27/17 0251 (!) 97.4 F (36.3 C)     Temp Source 02/27/17 0251 Oral     SpO2 02/27/17 0251 98 %     Weight 02/27/17 0252 175 lb (79.4 kg)     Height 02/27/17 0252 5\' 4"  (1.626 m)     Head Circumference --      Peak Flow --      Pain Score 02/27/17 0251 7     Pain Loc --       Pain Edu? --      Excl. in GC? --     Constitutional: Alert and oriented. Disheveled appearing and in mild acute distress. Intoxicated. Eyes: Conjunctivae are normal. PERRL. EOMI. Head: Head injury to left parietal scalp. Bleeding controlled. Will reexamine after wound is cleaned. Nose: No congestion/rhinnorhea. Mouth/Throat: Mucous membranes are moist.  Oropharynx non-erythematous. Neck: No stridor.  No midline cervical tenderness to palpation. No step-offs or deformities noted. Cardiovascular: Normal rate, regular rhythm. Grossly normal heart sounds.  Good peripheral circulation. Respiratory: Normal respiratory effort.  No retractions. Lungs CTAB. Gastrointestinal: Soft and nontender. No distention. No abdominal bruits. No CVA tenderness. Musculoskeletal: No lower extremity tenderness nor edema.  No joint effusions. Neurologic:  Slurred speech and language most likely from intoxication. No gross focal neurologic deficits are appreciated. MAEx4. Skin:  Skin is warm, dry and intact. No rash noted. Psychiatric: Mood and affect are normal. Speech and behavior are normal.  ____________________________________________   LABS (all labs ordered are listed, but only abnormal results are displayed)  Labs Reviewed  CBC WITH DIFFERENTIAL/PLATELET - Abnormal; Notable for the following:       Result Value   MCV 101.7 (*)    MCH 36.1 (*)    Platelets 134 (*)    Lymphs Abs 3.9 (*)    All other components within normal limits  COMPREHENSIVE METABOLIC PANEL - Abnormal; Notable for the following:    Glucose, Bld 130 (*)    BUN 5 (*)    Total Protein 8.4 (*)    AST 430 (*)    ALT 285 (*)    Alkaline Phosphatase 127 (*)    All other components within normal limits  ETHANOL - Abnormal; Notable for the following:    Alcohol, Ethyl (B) 425 (*)    All other components within normal limits  PROTIME-INR   ____________________________________________  EKG  ED ECG REPORT I, Brilyn Tuller J, the  attending physician, personally viewed and interpreted this ECG.   Date: 02/27/2017  EKG Time: 0259  Rate: 98  Rhythm: normal EKG, normal sinus rhythm  Axis: Normal  Intervals:none  ST&T Change: Nonspecific  ____________________________________________  RADIOLOGY  Ct Head Wo Contrast  Result Date: 02/27/2017 CLINICAL DATA:  Larey Seat tonight.  Scalp laceration. EXAM: CT HEAD WITHOUT CONTRAST CT CERVICAL SPINE WITHOUT CONTRAST TECHNIQUE: Multidetector CT imaging of the head and cervical spine was performed following the standard protocol without intravenous contrast. Multiplanar CT image reconstructions of the cervical spine were also generated. COMPARISON:  07/07/2015 FINDINGS: CT HEAD FINDINGS Brain: There is no intracranial hemorrhage, mass or evidence of acute infarction. There is no extra-axial fluid collection. Gray matter and white matter appear normal. Cerebral volume is normal for age. Brainstem and posterior fossa are unremarkable. The CSF spaces appear normal. Vascular: No hyperdense vessel or unexpected calcification. Skull: Normal. Negative for fracture or focal lesion. Sinuses/Orbits: No acute finding. Other: Marked swelling and laceration of the left parietal scalp posteriorly. CT CERVICAL SPINE FINDINGS Alignment: Normal. Skull base and vertebrae: No acute fracture. No primary bone lesion or focal pathologic process. Soft tissues and spinal canal: No prevertebral fluid or swelling. No visible canal hematoma. Disc levels: Good preservation of intervertebral disc spaces. Facet articulations are intact and well preserved. Upper chest: Negative. Other: None IMPRESSION: 1. Normal brain. 2. Marked soft tissue thickening and laceration at the posterior left parietal scalp. 3. Negative for acute cervical spine fracture. Electronically Signed   By: Ellery Plunk M.D.   On: 02/27/2017 03:55   Ct Cervical Spine Wo Contrast  Result Date: 02/27/2017 CLINICAL DATA:  Larey Seat tonight.  Scalp  laceration. EXAM: CT HEAD WITHOUT CONTRAST CT CERVICAL SPINE WITHOUT CONTRAST TECHNIQUE: Multidetector CT imaging of the head and cervical spine was performed following the standard protocol without intravenous contrast. Multiplanar CT image reconstructions of the cervical spine were also generated. COMPARISON:  07/07/2015 FINDINGS: CT HEAD FINDINGS Brain: There is no intracranial hemorrhage, mass or evidence of acute infarction. There is no extra-axial fluid collection. Gray matter and white matter appear normal. Cerebral volume is normal for age. Brainstem and posterior fossa are unremarkable. The CSF spaces appear normal. Vascular: No hyperdense vessel or unexpected calcification. Skull: Normal. Negative for fracture or focal lesion. Sinuses/Orbits:  No acute finding. Other: Marked swelling and laceration of the left parietal scalp posteriorly. CT CERVICAL SPINE FINDINGS Alignment: Normal. Skull base and vertebrae: No acute fracture. No primary bone lesion or focal pathologic process. Soft tissues and spinal canal: No prevertebral fluid or swelling. No visible canal hematoma. Disc levels: Good preservation of intervertebral disc spaces. Facet articulations are intact and well preserved. Upper chest: Negative. Other: None IMPRESSION: 1. Normal brain. 2. Marked soft tissue thickening and laceration at the posterior left parietal scalp. 3. Negative for acute cervical spine fracture. Electronically Signed   By: Ellery Plunkaniel R Mitchell M.D.   On: 02/27/2017 03:55    ____________________________________________   PROCEDURES  Procedure(s) performed: None  Procedures  Critical Care performed: No  ____________________________________________   INITIAL IMPRESSION / ASSESSMENT AND PLAN / ED COURSE  Pertinent labs & imaging results that were available during my care of the patient were reviewed by me and considered in my medical decision making (see chart for details).  46 year old female who presents  intoxicated status post fall with head injury. Cervical collar applied immediately upon patient's arrival to the treatment room. Will send for urgent CT head and cervical spine. Initiate IV fluid resuscitation and reassess.  [id number] Patient required low-dose Ativan for CT scan for her anxiety and claustrophobia.  Clinical Course as of Feb 27 658  Tue Feb 27, 2017  0404 Updated patient of negative CT head and cervical spine results. Cervical collar removed. Advised patient ideally she requires 2 staples to her scalp wound which was cleaned by nursing. Mostly the scalp wound appears macerated; 0.5 cm linear laceration at the inferior aspect is noted. Patient immediately begins to cry hysterically and thrashing her head and neck about, refusing staples.  [JS]  [id number] Patient sleeping soundly. Additive effects of heavy alcohol consumption and benzodiazepines dropped her blood pressure. Will administer third liter IV fluids. Anticipate discharge home once patient is sober and ambulatory. Care transferred to Dr. Roxan Hockeyobinson.  [JS]    Clinical Course User Index [JS] Irean HongSung, Luman Holway J, MD     ____________________________________________   FINAL CLINICAL IMPRESSION(S) / ED DIAGNOSES  Final diagnoses:  Fall, initial encounter  Injury of head, initial encounter  Laceration of scalp, initial encounter  Alcoholic intoxication without complication (HCC)      NEW MEDICATIONS STARTED DURING THIS VISIT:  New Prescriptions   No medications on file     Note:  This document was prepared using Dragon voice recognition software and may include unintentional dictation errors.    Irean HongSung, Kolbe Delmonaco J, MD 02/27/17 [phone]

## 2017-02-27 NOTE — Discharge Instructions (Signed)
1. Drink alcohol only in moderation. 2. Apply ice to affected area several times daily. 3. Return to the ER for worsening symptoms, persistent vomiting, lethargy or other concerns.

## 2017-02-27 NOTE — ED Notes (Signed)
Patient denies pain and is resting comfortably.  

## 2017-02-27 NOTE — ED Notes (Signed)
Food and PO fluids given to pt by tech Crystal per order.

## 2017-03-06 ENCOUNTER — Emergency Department
Admission: EM | Admit: 2017-03-06 | Discharge: 2017-03-06 | Disposition: A | Discharge status: Home / Self Care | Payer: Medicaid Other | Attending: Emergency Medicine | Admitting: Emergency Medicine

## 2017-03-06 DIAGNOSIS — Z79899 Other long term (current) drug therapy: Secondary | ICD-10-CM | POA: Insufficient documentation

## 2017-03-06 DIAGNOSIS — J449 Chronic obstructive pulmonary disease, unspecified: Secondary | ICD-10-CM | POA: Insufficient documentation

## 2017-03-06 DIAGNOSIS — R030 Elevated blood-pressure reading, without diagnosis of hypertension: Secondary | ICD-10-CM

## 2017-03-06 DIAGNOSIS — I10 Essential (primary) hypertension: Secondary | ICD-10-CM | POA: Diagnosis not present

## 2017-03-06 DIAGNOSIS — J45909 Unspecified asthma, uncomplicated: Secondary | ICD-10-CM | POA: Insufficient documentation

## 2017-03-06 DIAGNOSIS — F17210 Nicotine dependence, cigarettes, uncomplicated: Secondary | ICD-10-CM | POA: Insufficient documentation

## 2017-03-06 DIAGNOSIS — Z91040 Latex allergy status: Secondary | ICD-10-CM | POA: Diagnosis not present

## 2017-03-06 DIAGNOSIS — Z4802 Encounter for removal of sutures: Secondary | ICD-10-CM | POA: Diagnosis not present

## 2017-03-06 NOTE — ED Triage Notes (Signed)
Pt here for staple removal from head.  

## 2017-03-06 NOTE — ED Provider Notes (Signed)
[facility] Emergency Department Provider Note ____________________________________________  Time seen: 1:38 PM  I have reviewed the triage vital signs and the nursing notes.  HISTORY  Chief Complaint  Suture / Staple Removal   HPI Susan [name] is a 46 y.o. female is here for removal staples. She denies any problems.  Past Medical History:  Diagnosis Date  . Allergy   . Anemia   . Anxiety   . Asthma   . Cirrhosis (HCC)   . COPD (chronic obstructive pulmonary disease) (HCC) 12/09/2014  . Depression   . Emphysema of lung (HCC)   . GERD (gastroesophageal reflux disease)   . Heart murmur   . Hepatitis C    pt said they told her she had this last visit here  . Hyperlipidemia   . Hypertension     Patient Active Problem List   Diagnosis Date Noted  . Elevated LFTs 04/02/2015  . Hepatic cirrhosis (HCC) 12/12/2014  . Diarrhea 12/09/2014  . Blood in stool 12/09/2014  . Abdominal pain 12/09/2014  . COPD (chronic obstructive pulmonary disease) (HCC) 12/09/2014    Past Surgical History:  Procedure Laterality Date  . CESAREAN SECTION  2009    Prior to Admission medications   Medication Sig Start Date End Date Taking? Authorizing Provider  albuterol (PROVENTIL HFA;VENTOLIN HFA) 108 (90 BASE) MCG/ACT inhaler Inhale 1 puff into the lungs every 4 (four) hours as needed for wheezing or shortness of breath.     [provider]  budesonide-formoterol (SYMBICORT) 80-4.5 MCG/ACT inhaler Inhale 2 puffs into the lungs 2 (two) times daily.     [provider]  clonazePAM (KLONOPIN) 1 MG tablet Take 1 mg by mouth 2 (two) times daily.     [provider]  escitalopram (LEXAPRO) 20 MG tablet Take 20 mg by mouth at bedtime.     [provider]  furosemide (LASIX) 40 MG tablet Take 40 mg by mouth 2 (two) times daily.     [provider]  hydrOXYzine (ATARAX/VISTARIL) 25 MG tablet Take 1 tablet (25 mg total) by mouth 3  (three) times daily as needed (itching). Patient not taking: Reported on 01/04/2017 04/07/15   Milagros LollSudini, Srikar, MD  ipratropium-albuterol (DUONEB) 0.5-2.5 (3) MG/3ML SOLN Take 3 mLs by nebulization every 6 (six) hours as needed (for shortness of breath/wheezing).     [provider]  loratadine (CLARITIN) 10 MG tablet Take 10 mg by mouth daily.     [provider]  omeprazole (PRILOSEC) 40 MG capsule Take 40 mg by mouth daily.     [provider]  Potassium Chloride ER 20 MEQ TBCR Take 40 mEq by mouth daily.  09/28/14   [provider]  spironolactone (ALDACTONE) 100 MG tablet Take 100 mg by mouth daily.     [provider]    Allergies Doxycycline; Latex; Promethazine; Ranitidine; and Zofran [ondansetron hcl]  Family History  Problem Relation Age of Onset  . Other Mother   . Lung cancer Mother   . Skin cancer Father     Social History Social History  Substance Use Topics  . Smoking status: Current Every Day Smoker    Packs/day: 1.00    Years: 20.00    Types: Cigarettes  . Smokeless tobacco: Never Used  . Alcohol use No     Comment: Quit drinking few years ago.    Review of Systems  Constitutional: Negative for fever. Cardiovascular: Negative for chest pain. Respiratory: Negative for shortness of breath.  Skin:Healed laceration Neurological: Negative for headaches ____________________________________________  PHYSICAL EXAM:  VITAL SIGNS: ED Triage Vitals [03/06/17 1314]  Enc Vitals Group     BP (!) 169/108     Pulse Rate 87     Resp 16     Temp 98.1 F (36.7 C)     Temp Source Oral     SpO2 98 %     Weight 175 lb (79.4 kg)     Height 5\' 3"  (1.6 m)     Head Circumference      Peak Flow      Pain Score 8     Pain Loc      Pain Edu?      Excl. in GC?     Constitutional: Alert and oriented. Well appearing and in no distress. Head: Normocephalic and atraumatic. Eyes: Conjunctivae are normal. Neck: No  stridor Hematological/Lymphatic/Immunological: No cervical lymphadenopathy. Respiratory: Normal respiratory effort. Neurologic:  Normal gait without ataxia. Normal speech and language. No gross focal neurologic deficits are appreciated. Skin:  Skin is warm, dry. She'll without any signs of infection at the wound site. Psychiatric: Mood and affect are normal. Patient exhibits appropriate insight and judgment.  INITIAL IMPRESSION / ASSESSMENT AND PLAN / ED COURSE  Staples were removed without any difficulty. Patient's blood pressure was elevated and she is encouraged to follow up with her primary care to have her blood pressure rechecked.    ____________________________________________  FINAL CLINICAL IMPRESSION(S) / ED DIAGNOSES  Final diagnoses:  Removal of staples  Elevated blood pressure reading     Tommi RumpsSummers, Galit Urich L, PA-C 03/06/17 1356    Jeanmarie PlantMcShane, James A, MD 03/06/17 1446

## 2017-03-06 NOTE — Discharge Instructions (Signed)
Follow-up with your primary care doctor to have your blood pressure rechecked.

## 2017-04-22 IMAGING — US US ABDOMEN COMPLETE W/ ELASTOGRAPHY
1 series · 13 of 25 positions shown · non-contrast
Comparison: 04/05/2015

CLINICAL DATA: Alcoholic cirrhosis.



[Series 1: us abdomen complete w/ elastography · 0.19mm/px · 13 of 112 slices shown]
[im 1/112]
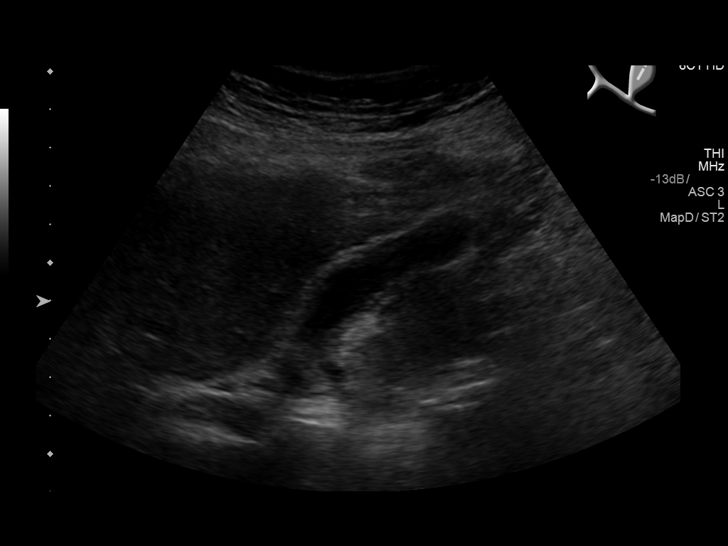
[im 10/112]
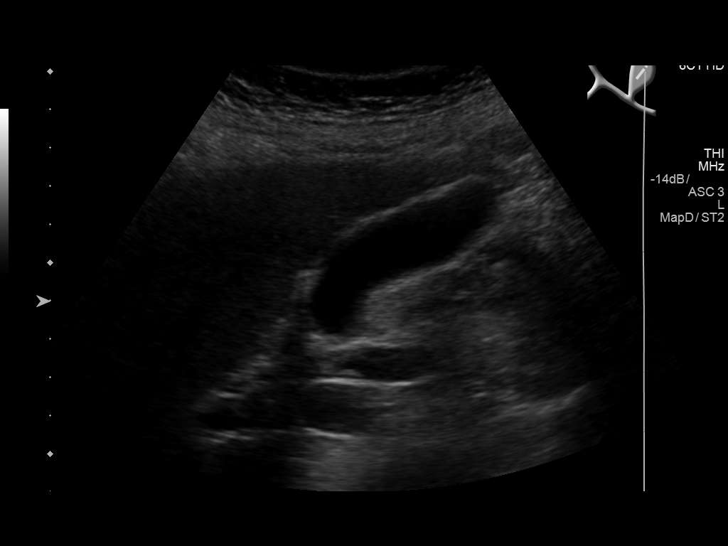
[im 19/112]
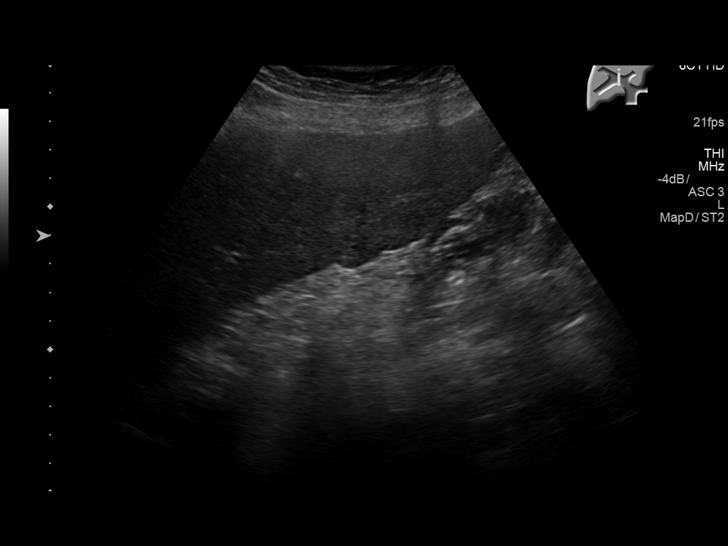
[im 28/112]
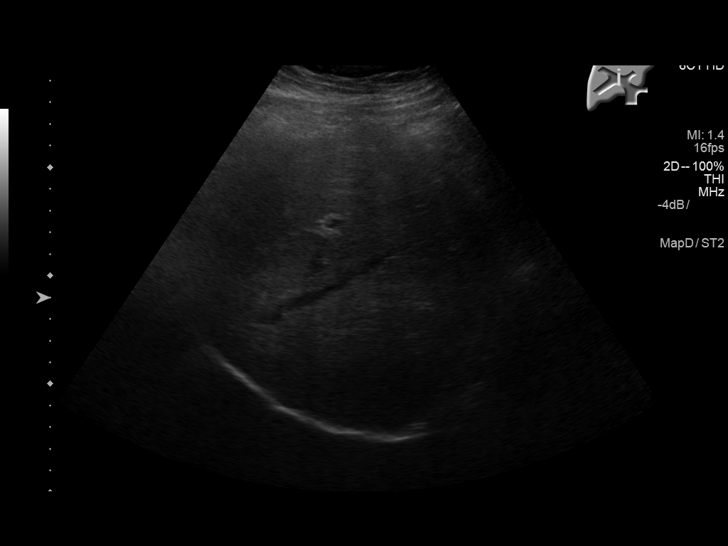
[im 38/112]
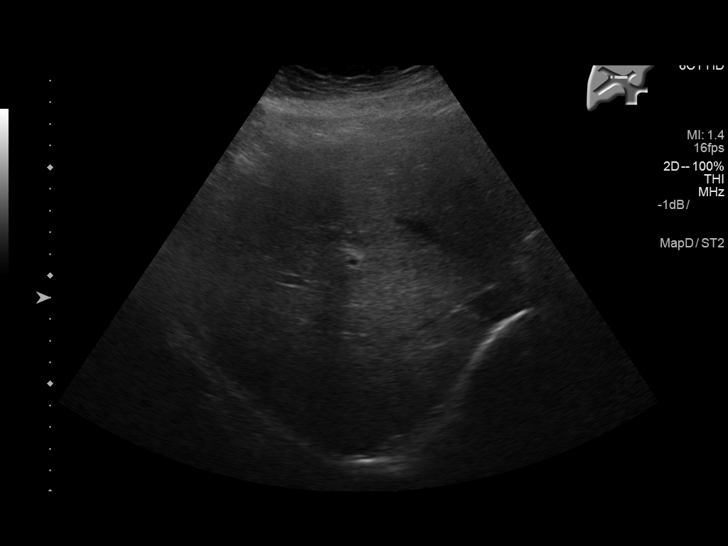
[im 47/112]
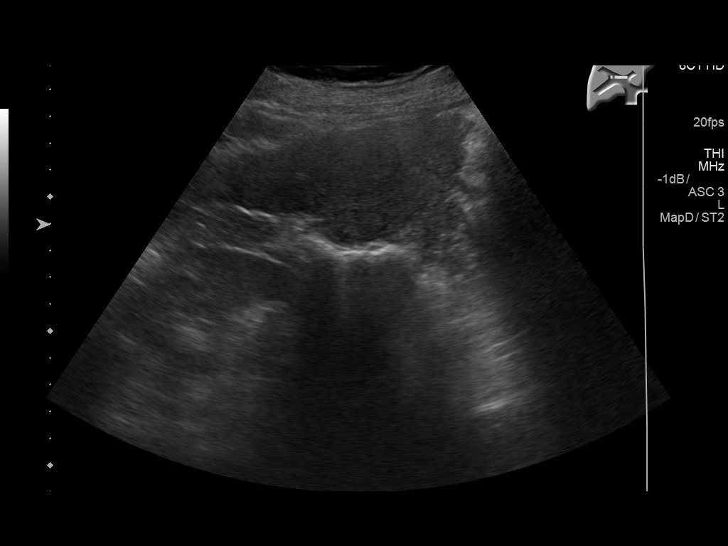
[im 56/112]
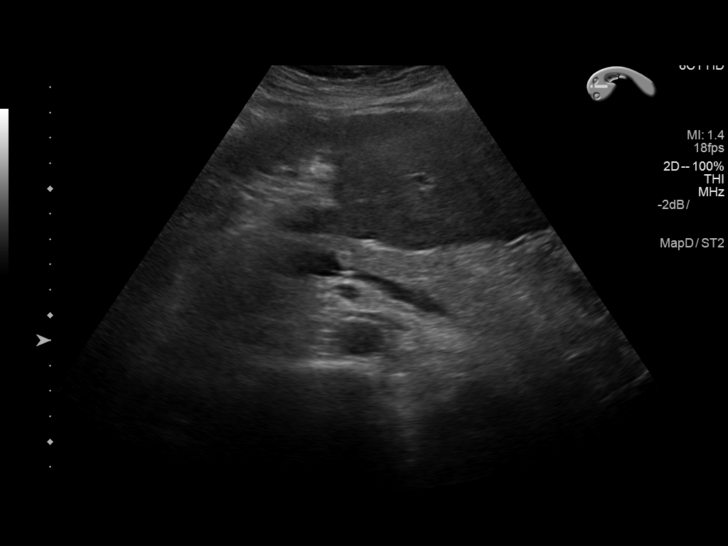
[im 65/112]
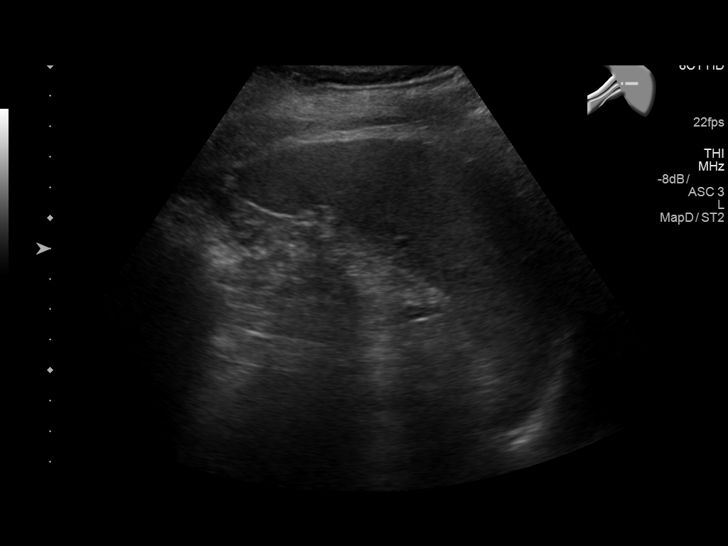
[im 75/112]
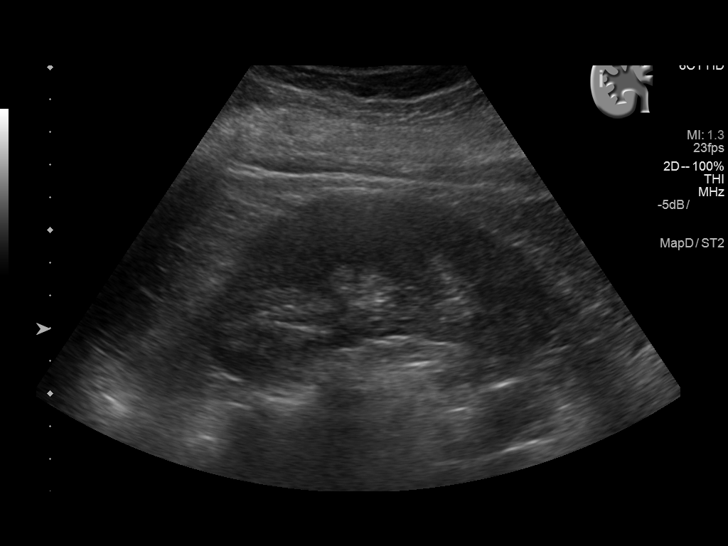
[im 84/112]
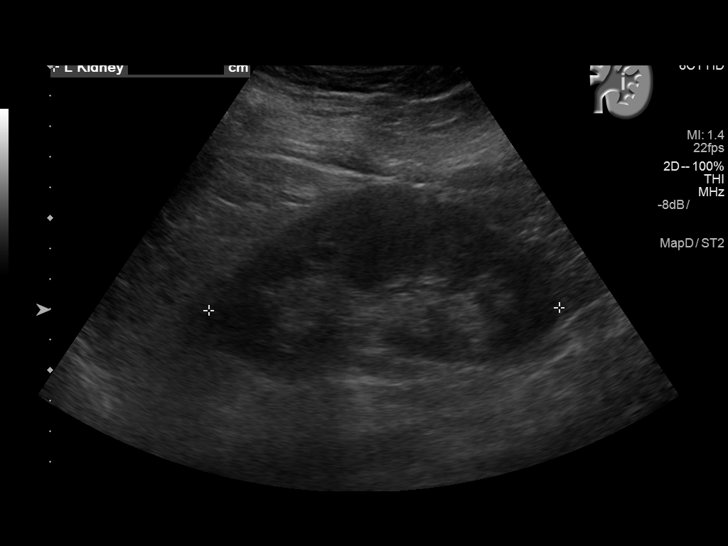
[im 93/112]
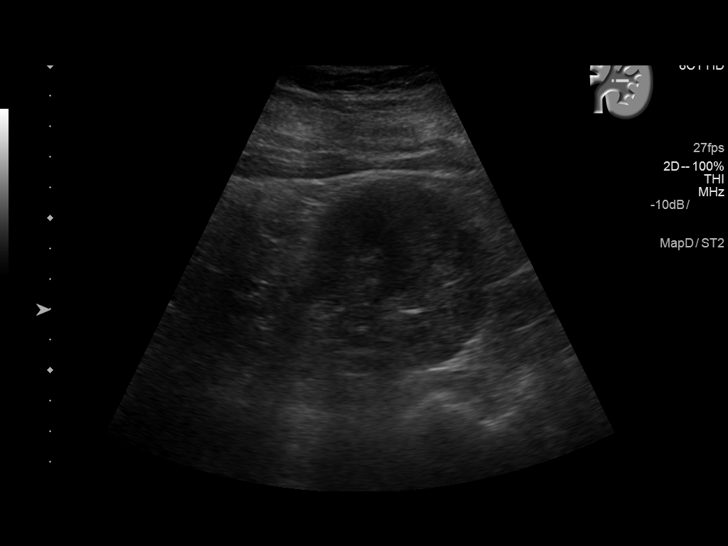
[im 102/112]
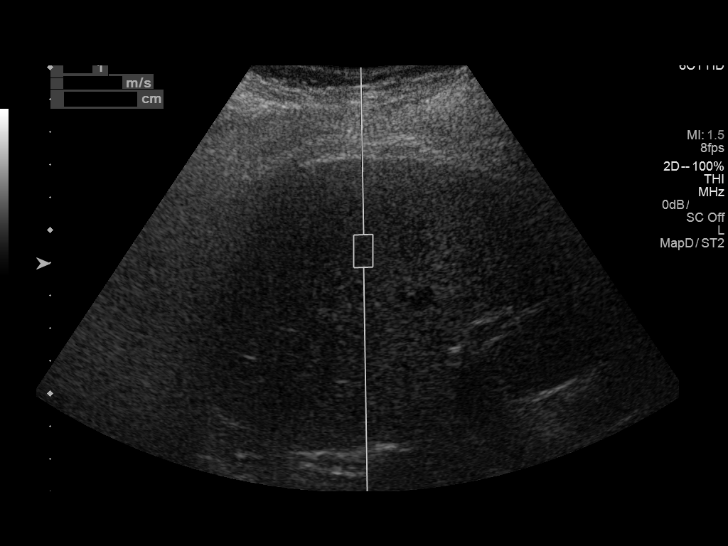
[im 112/112]
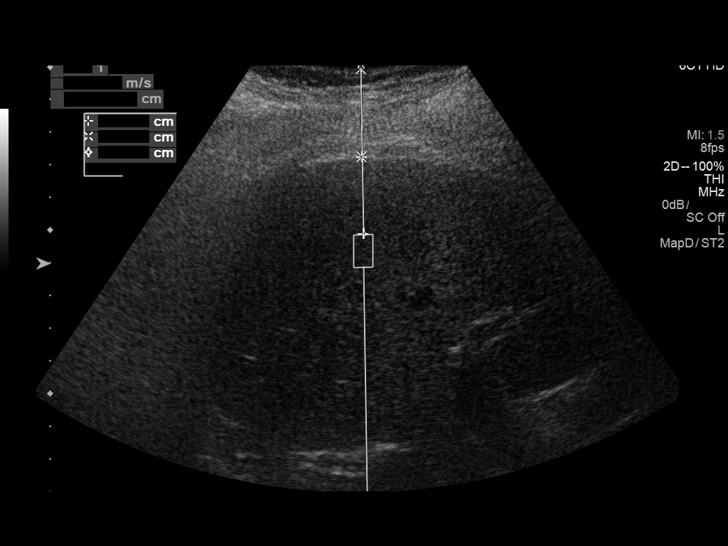

[13 of 25 positions shown; findings below may reference images not displayed]

FINDINGS: ULTRASOUND ABDOMEN

Gallbladder: No gallstones or wall thickening visualized. No
sonographic Murphy sign noted by sonographer.

Common bile duct: Diameter: 7.8 mm

Liver: The contour or of the liver is echogenic scratch set the
contour the liver is nodular. No focal liver abnormality.

IVC: No abnormality visualized.

Pancreas: Visualized portion unremarkable.

Spleen: Measures 10 cm.

Right Kidney: Length: 10.5 cm. Echogenicity within normal limits. No
mass or hydronephrosis visualized.

Left Kidney: Length: 11.5 cm. Echogenicity within normal limits. No
mass or hydronephrosis visualized.

Abdominal aorta: Measures 2.5 cm.

Other findings: None.

ULTRASOUND HEPATIC ELASTOGRAPHY

Device: Siemens Helix VTQ

Patient position:  Supine with right arm overhead

Transducer 6 C1

Number of measurements:  10

Hepatic Segment:  8

Median velocity:   3.13  m/sec

IQR:

IQR/Median velocity ratio

Corresponding Metavir fibrosis score:  F3 and F4

Risk of fibrosis: High

Limitations of exam: None

Pertinent findings noted on other imaging exams: Cirrhotic appearing
liver.

Please note that abnormal shear wave velocities may also be
identified in clinical settings other than with hepatic fibrosis,
such as: acute hepatitis, elevated right heart and central venous
pressures including use of beta blockers, Chincha disease
(Saltis), infiltrative processes such as
mastocytosis/amyloidosis/infiltrative tumor, extrahepatic
cholestasis, in the post-prandial state, and liver transplantation.
Correlation with patient history, laboratory data, and clinical
condition recommended.
IMPRESSION: 1. The liver has a nodular contour compatible with cirrhosis.
2. Ectatic abdominal aorta at risk for aneurysm development.
Recommend followup by ultrasound in 5 years. This recommendation
follows ACR consensus guidelines: White Paper of the ACR Incidental
Findings Committee II on Vascular Findings. [HOSPITAL] 5291;
[DATE].

Median hepatic shear wave velocity is calculated at 3.13 m/sec.

Corresponding Metavir fibrosis score is F3 and F4.

Risk of fibrosis is high.

Follow-up:  Followup is advised

## 2017-04-28 ENCOUNTER — Inpatient Hospital Stay
Admission: EM | Admit: 2017-04-28 | Discharge: 2017-05-04 | DRG: 896 | Disposition: A | Discharge status: Home / Self Care | Payer: Medicaid Other | Attending: Internal Medicine | Admitting: Internal Medicine

## 2017-04-28 ENCOUNTER — Encounter: Payer: Self-pay | Admitting: Emergency Medicine

## 2017-04-28 ENCOUNTER — Emergency Department: Payer: Medicaid Other

## 2017-04-28 DIAGNOSIS — I10 Essential (primary) hypertension: Secondary | ICD-10-CM | POA: Diagnosis present

## 2017-04-28 DIAGNOSIS — D6959 Other secondary thrombocytopenia: Secondary | ICD-10-CM | POA: Diagnosis present

## 2017-04-28 DIAGNOSIS — Z808 Family history of malignant neoplasm of other organs or systems: Secondary | ICD-10-CM

## 2017-04-28 DIAGNOSIS — Z7951 Long term (current) use of inhaled steroids: Secondary | ICD-10-CM | POA: Diagnosis not present

## 2017-04-28 DIAGNOSIS — Z881 Allergy status to other antibiotic agents status: Secondary | ICD-10-CM

## 2017-04-28 DIAGNOSIS — F329 Major depressive disorder, single episode, unspecified: Secondary | ICD-10-CM | POA: Diagnosis present

## 2017-04-28 DIAGNOSIS — Z91040 Latex allergy status: Secondary | ICD-10-CM

## 2017-04-28 DIAGNOSIS — F10231 Alcohol dependence with withdrawal delirium: Secondary | ICD-10-CM

## 2017-04-28 DIAGNOSIS — R17 Unspecified jaundice: Secondary | ICD-10-CM

## 2017-04-28 DIAGNOSIS — R011 Cardiac murmur, unspecified: Secondary | ICD-10-CM | POA: Diagnosis present

## 2017-04-28 DIAGNOSIS — K7030 Alcoholic cirrhosis of liver without ascites: Secondary | ICD-10-CM | POA: Diagnosis present

## 2017-04-28 DIAGNOSIS — E785 Hyperlipidemia, unspecified: Secondary | ICD-10-CM | POA: Diagnosis present

## 2017-04-28 DIAGNOSIS — F10931 Alcohol use, unspecified with withdrawal delirium: Secondary | ICD-10-CM

## 2017-04-28 DIAGNOSIS — F17210 Nicotine dependence, cigarettes, uncomplicated: Secondary | ICD-10-CM | POA: Diagnosis present

## 2017-04-28 DIAGNOSIS — B1920 Unspecified viral hepatitis C without hepatic coma: Secondary | ICD-10-CM | POA: Diagnosis not present

## 2017-04-28 DIAGNOSIS — R109 Unspecified abdominal pain: Secondary | ICD-10-CM | POA: Diagnosis present

## 2017-04-28 DIAGNOSIS — Z801 Family history of malignant neoplasm of trachea, bronchus and lung: Secondary | ICD-10-CM | POA: Diagnosis not present

## 2017-04-28 DIAGNOSIS — K210 Gastro-esophageal reflux disease with esophagitis: Secondary | ICD-10-CM | POA: Diagnosis present

## 2017-04-28 DIAGNOSIS — R945 Abnormal results of liver function studies: Secondary | ICD-10-CM | POA: Diagnosis not present

## 2017-04-28 DIAGNOSIS — F419 Anxiety disorder, unspecified: Secondary | ICD-10-CM | POA: Diagnosis present

## 2017-04-28 DIAGNOSIS — Z888 Allergy status to other drugs, medicaments and biological substances status: Secondary | ICD-10-CM | POA: Diagnosis not present

## 2017-04-28 DIAGNOSIS — K831 Obstruction of bile duct: Secondary | ICD-10-CM | POA: Diagnosis present

## 2017-04-28 DIAGNOSIS — J439 Emphysema, unspecified: Secondary | ICD-10-CM | POA: Diagnosis present

## 2017-04-28 DIAGNOSIS — F23 Brief psychotic disorder: Secondary | ICD-10-CM | POA: Diagnosis present

## 2017-04-28 DIAGNOSIS — R1011 Right upper quadrant pain: Secondary | ICD-10-CM | POA: Diagnosis not present

## 2017-04-28 LAB — COMPREHENSIVE METABOLIC PANEL
ALBUMIN: 4.1 g/dL (ref 3.5–5.0)
ALK PHOS: 117 U/L (ref 38–126)
ALT: 137 U/L — AB (ref 14–54)
ANION GAP: 12 (ref 5–15)
AST: 248 U/L — ABNORMAL HIGH (ref 15–41)
BUN: 11 mg/dL (ref 6–20)
CALCIUM: 9.3 mg/dL (ref 8.9–10.3)
CO2: 20 mmol/L — AB (ref 22–32)
CREATININE: 0.42 mg/dL — AB (ref 0.44–1.00)
Chloride: 98 mmol/L — ABNORMAL LOW (ref 101–111)
GFR calc Af Amer: >60 mL/min (ref >60)
GFR calc non Af Amer: >60 mL/min (ref >60)
GLUCOSE: 113 mg/dL — AB (ref 65–99)
Potassium: 3.5 mmol/L (ref 3.5–5.1)
SODIUM: 130 mmol/L — AB (ref 135–145)
Total Bilirubin: 4.2 mg/dL — ABNORMAL HIGH (ref 0.3–1.2)
Total Protein: 8.7 g/dL — ABNORMAL HIGH (ref 6.5–8.1)

## 2017-04-28 LAB — CBC
HCT: 43.6 % (ref 35.0–47.0)
HEMOGLOBIN: 15.4 g/dL (ref 12.0–16.0)
MCH: 35.9 pg — AB (ref 26.0–34.0)
MCHC: 35.3 g/dL (ref 32.0–36.0)
MCV: 101.7 fL — ABNORMAL HIGH (ref 80.0–100.0)
Platelets: 71 10*3/uL — ABNORMAL LOW (ref 150–440)
RBC: 4.28 MIL/uL (ref 3.80–5.20)
RDW: 12.8 % (ref 11.5–14.5)
WBC: 6.8 10*3/uL (ref 3.6–11.0)

## 2017-04-28 LAB — URINALYSIS, COMPLETE (UACMP) WITH MICROSCOPIC
Bacteria, UA: NONE SEEN
Bilirubin Urine: NEGATIVE
Glucose, UA: NEGATIVE mg/dL
HGB URINE DIPSTICK: NEGATIVE
Ketones, ur: NEGATIVE mg/dL
Leukocytes, UA: NEGATIVE
Nitrite: NEGATIVE
PH: 6 (ref 5.0–8.0)
Protein, ur: NEGATIVE mg/dL
SPECIFIC GRAVITY, URINE: 1.017 (ref 1.005–1.030)

## 2017-04-28 LAB — PREGNANCY, URINE: PREG TEST UR: NEGATIVE

## 2017-04-28 LAB — LIPASE, BLOOD: Lipase: 27 U/L (ref 11–51)

## 2017-04-28 MED ORDER — MORPHINE SULFATE (PF) 2 MG/ML IV SOLN
2.0000 mg | Freq: Four times a day (QID) | INTRAVENOUS | Status: DC | PRN
  Administered 2017-04-28 – 2017-04-29 (×2): 2 mg via INTRAVENOUS
  Filled 2017-04-28: qty 1

## 2017-04-28 MED ORDER — LORATADINE 10 MG PO TABS
10.0000 mg | ORAL_TABLET | Freq: Every day | ORAL | Status: DC
  Administered 2017-04-28 – 2017-05-04 (×6): 10 mg via ORAL
  Filled 2017-04-28 (×6): qty 1

## 2017-04-28 MED ORDER — ALBUTEROL SULFATE (2.5 MG/3ML) 0.083% IN NEBU: 2.5000 mg | INHALATION_SOLUTION | RESPIRATORY_TRACT | Status: DC | PRN

## 2017-04-28 MED ORDER — MORPHINE SULFATE (PF) 2 MG/ML IV SOLN
INTRAVENOUS | Status: AC
  Administered 2017-04-28: 2 mg via INTRAVENOUS
  Filled 2017-04-28: qty 1

## 2017-04-28 MED ORDER — SODIUM CHLORIDE 0.9 % IV BOLUS (SEPSIS)
500.0000 mL | Freq: Once | INTRAVENOUS | Status: AC
  Administered 2017-04-28: 500 mL via INTRAVENOUS

## 2017-04-28 MED ORDER — POTASSIUM CHLORIDE CRYS ER 20 MEQ PO TBCR
40.0000 meq | EXTENDED_RELEASE_TABLET | Freq: Every day | ORAL | Status: DC
  Administered 2017-04-28 – 2017-05-04 (×6): 40 meq via ORAL
  Filled 2017-04-28 (×6): qty 2

## 2017-04-28 MED ORDER — FUROSEMIDE 20 MG PO TABS
40.0000 mg | ORAL_TABLET | Freq: Two times a day (BID) | ORAL | Status: DC
  Administered 2017-04-29: 40 mg via ORAL
  Filled 2017-04-28: qty 1

## 2017-04-28 MED ORDER — TRAMADOL HCL 50 MG PO TABS
50.0000 mg | ORAL_TABLET | Freq: Four times a day (QID) | ORAL | Status: DC | PRN
  Administered 2017-04-29 – 2017-05-04 (×4): 50 mg via ORAL
  Filled 2017-04-28 (×4): qty 1

## 2017-04-28 MED ORDER — MOMETASONE FURO-FORMOTEROL FUM 100-5 MCG/ACT IN AERO
2.0000 | INHALATION_SPRAY | Freq: Two times a day (BID) | RESPIRATORY_TRACT | Status: DC
  Administered 2017-04-28 – 2017-05-04 (×9): 2 via RESPIRATORY_TRACT
  Filled 2017-04-28 (×2): qty 8.8

## 2017-04-28 MED ORDER — CLONAZEPAM 1 MG PO TABS
1.0000 mg | ORAL_TABLET | Freq: Two times a day (BID) | ORAL | Status: DC
  Administered 2017-04-28 – 2017-04-29 (×3): 1 mg via ORAL
  Filled 2017-04-28 (×2): qty 2
  Filled 2017-04-28: qty 1

## 2017-04-28 MED ORDER — DICYCLOMINE HCL 10 MG PO CAPS
10.0000 mg | ORAL_CAPSULE | Freq: Once | ORAL | Status: AC
  Administered 2017-04-28: 10 mg via ORAL
  Filled 2017-04-28: qty 1

## 2017-04-28 MED ORDER — SPIRONOLACTONE 25 MG PO TABS
100.0000 mg | ORAL_TABLET | Freq: Every day | ORAL | Status: DC
  Administered 2017-04-28 – 2017-05-04 (×6): 100 mg via ORAL
  Filled 2017-04-28 (×6): qty 4

## 2017-04-28 MED ORDER — ESCITALOPRAM OXALATE 10 MG PO TABS
20.0000 mg | ORAL_TABLET | Freq: Every day | ORAL | Status: DC
  Administered 2017-04-28 – 2017-05-03 (×6): 20 mg via ORAL
  Filled 2017-04-28: qty 1
  Filled 2017-04-28 (×7): qty 2

## 2017-04-28 MED ORDER — NICOTINE 14 MG/24HR TD PT24
14.0000 mg | MEDICATED_PATCH | Freq: Every day | TRANSDERMAL | Status: DC
  Administered 2017-04-28 – 2017-05-04 (×7): 14 mg via TRANSDERMAL
  Filled 2017-04-28 (×7): qty 1

## 2017-04-28 MED ORDER — IPRATROPIUM-ALBUTEROL 0.5-2.5 (3) MG/3ML IN SOLN: 3.0000 mL | Freq: Four times a day (QID) | RESPIRATORY_TRACT | Status: DC | PRN

## 2017-04-28 MED ORDER — PANTOPRAZOLE SODIUM 40 MG PO TBEC
40.0000 mg | DELAYED_RELEASE_TABLET | Freq: Every day | ORAL | Status: DC
  Administered 2017-04-28 – 2017-04-29 (×2): 40 mg via ORAL
  Filled 2017-04-28 (×2): qty 1

## 2017-04-28 NOTE — H&P (Signed)
[facility] [place] at [facility]   PATIENT NAME: Susan [name]    MR#:  [id number]  DATE OF BIRTH:  13-Nov-1970  DATE OF ADMISSION:  04/28/2017  PRIMARY CARE PHYSICIAN: [name], MD   REQUESTING/REFERRING PHYSICIAN: Derrill Kay  CHIEF COMPLAINT:   Abdominal pain HISTORY OF PRESENT ILLNESS:  Susan [name]  is a 46 y.o. 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 Denies any nausea or vomiting but reporting decreased appetite. Patient came into the ED, LFTs are abnormal,abdominal ultrasound has revealed prominent extrahepatic bile duct, total bilirubin is elevated. ED physician has discussed with his GI physician who has recommended to get MRCP and admit the patient to the hospital for possible ERCP. Patient is resting comfortably during my examination and pain is well controlled after receiving pain medicine  PAST MEDICAL HISTORY:   Past Medical History:  Diagnosis Date  . Allergy   . Anemia   . Anxiety   . Asthma   . Cirrhosis (HCC)   . COPD (chronic obstructive pulmonary disease) (HCC) 12/09/2014  . Depression   . Emphysema of lung (HCC)   . GERD (gastroesophageal reflux disease)   . Heart murmur   . Hepatitis C    pt said they told her she had this last visit here  . Hyperlipidemia   . Hypertension     PAST SURGICAL HISTOIRY:   Past Surgical History:  Procedure Laterality Date  . CESAREAN SECTION  2009    SOCIAL HISTORY:   Social History  Substance Use Topics  . Smoking status: Current Every Day Smoker    Packs/day: 1.00    Years: 20.00    Types: Cigarettes  . Smokeless tobacco: Never Used  . Alcohol use No     Comment: Quit drinking few years ago.    FAMILY HISTORY:   Family History  Problem Relation Age of  Onset  . Other Mother   . Lung cancer Mother   . Skin cancer Father     DRUG ALLERGIES:   Allergies  Allergen Reactions  . Doxycycline Hives  . Latex Hives  . Promethazine Diarrhea  . Ranitidine Nausea Only  . Zofran [Ondansetron Hcl] Itching    REVIEW OF SYSTEMS:  CONSTITUTIONAL: No fever, fatigue or weakness.  EYES: No blurred or double vision.  EARS, NOSE, AND THROAT: No tinnitus or ear pain.  RESPIRATORY: No cough, shortness of breath, wheezing or hemoptysis.  CARDIOVASCULAR: No chest pain, orthopnea, edema.  GASTROINTESTINAL: No nausea, vomiting, diarrhea ;reporting right-sided abdominal pain for 2 days GENITOURINARY: No dysuria, hematuria.  ENDOCRINE: No polyuria, nocturia,  HEMATOLOGY: No anemia, easy bruising or bleeding SKIN: No rash or lesion. MUSCULOSKELETAL: No joint pain or arthritis.   NEUROLOGIC: No tingling, numbness, weakness.  PSYCHIATRY: No anxiety or depression.   MEDICATIONS AT HOME:   Prior to Admission medications   Medication Sig Start Date End Date Taking? Authorizing Provider  albuterol (PROVENTIL HFA;VENTOLIN HFA) 108 (90 BASE) MCG/ACT inhaler Inhale 1 puff into the lungs every 4 (four) hours as needed for wheezing or shortness of breath.     [provider]  budesonide-formoterol (SYMBICORT) 80-4.5 MCG/ACT inhaler Inhale 2 puffs into the lungs 2 (two) times daily.     [provider]  clonazePAM (KLONOPIN) 1 MG tablet  Take 1 mg by mouth 2 (two) times daily.     [provider]  escitalopram (LEXAPRO) 20 MG tablet Take 20 mg by mouth at bedtime.     [provider]  furosemide (LASIX) 40 MG tablet Take 40 mg by mouth 2 (two) times daily.     [provider]  hydrOXYzine (ATARAX/VISTARIL) 25 MG tablet Take 1 tablet (25 mg total) by mouth 3 (three) times daily as needed (itching). Patient not taking: Reported on 01/04/2017 04/07/15   Milagros Loll, MD  ipratropium-albuterol (DUONEB) 0.5-2.5 (3) MG/3ML SOLN  Take 3 mLs by nebulization every 6 (six) hours as needed (for shortness of breath/wheezing).     [provider]  loratadine (CLARITIN) 10 MG tablet Take 10 mg by mouth daily.     [provider]  omeprazole (PRILOSEC) 40 MG capsule Take 40 mg by mouth daily.     [provider]  Potassium Chloride ER 20 MEQ TBCR Take 40 mEq by mouth daily.  09/28/14   [provider]  spironolactone (ALDACTONE) 100 MG tablet Take 100 mg by mouth daily.     [provider]      VITAL SIGNS:  Blood pressure (!) 147/83, pulse 95, temperature 98.4 F (36.9 C), temperature source Oral, resp. rate 20, height  (1.626 m), weight 76.6 kg (168 lb 12.8 oz), SpO2 100 %.  PHYSICAL EXAMINATION:  GENERAL:  46 y.o.-year-old patient lying in the bed with no acute distress.  EYES: Pupils equal, round, reactive to light and accommodation. No scleral icterus. Extraocular muscles intact.  HEENT: Head atraumatic, normocephalic. Oropharynx and nasopharynx clear.  NECK:  Supple, no jugular venous distention. No thyroid enlargement, no tenderness.  LUNGS: Normal breath sounds bilaterally, no wheezing, rales,rhonchi or crepitation. No use of accessory muscles of respiration.  CARDIOVASCULAR: S1, S2 normal. No murmurs, rubs, or gallops.  ABDOMEN: Soft,right-sided abdominal tenderness is present but no rebound tenderness  nondistended. Bowel sounds present.   EXTREMITIES: No pedal edema, cyanosis, or clubbing.  NEUROLOGIC: Cranial nerves II through XII are intact. Muscle strength 5/5 in all extremities. Sensation intact. Gait not checked.  PSYCHIATRIC: The patient is alert and oriented x 3.  SKIN: No obvious rash, lesion, or ulcer.   LABORATORY PANEL:   CBC  Recent Labs Lab 04/28/17 1200  WBC 6.8  HGB 15.4  HCT 43.6  PLT 71*   ------------------------------------------------------------------------------------------------------------------  Chemistries   Recent Labs Lab  04/28/17 1200  NA 130*  K 3.5  CL 98*  CO2 20*  GLUCOSE 113*  BUN 11  CREATININE 0.42*  CALCIUM 9.3  AST 248*  ALT 137*  ALKPHOS 117  BILITOT 4.2*   ------------------------------------------------------------------------------------------------------------------  Cardiac Enzymes No results for input(s): TROPONINI in the last 168 hours. ------------------------------------------------------------------------------------------------------------------  RADIOLOGY:  US Abdomen Limited Ruq  Result Date: 04/28/2017 CLINICAL DATA:  Abdominal pain with nausea for 5 days EXAM: ULTRASOUND ABDOMEN LIMITED RIGHT UPPER QUADRANT COMPARISON:  11/17/2015 FINDINGS: Gallbladder: No gallstones or wall thickening visualized. No sonographic Murphy sign noted by sonographer. Common bile duct: Diameter: Maximum diameter of 8.5 mm. Liver: No focal lesion identified. Nodular liver contour, suspicious for cirrhosis. Portal vein is patent on color Doppler imaging with normal direction of blood flow towards the liver. IMPRESSION: 1. Negative for acute cholecystitis 2. Prominent extrahepatic bile duct up to 8.5 mm, suggest correlation with laboratory values 3. Nodular liver contour suspicious for cirrhosis. Electronically Signed   By: Jasmine Pang M.D.   On: 04/28/2017 18:08  EKG:   Orders placed or performed during the hospital encounter of 04/28/17  . ED EKG  . ED EKG  . EKG 12-Lead  . EKG 12-Lead    IMPRESSION AND PLAN:  Susan [name]  is a 46 y.o. 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 Denies any nausea or vomiting but reporting decreased appetite. Patient came into the ED, LFTs are abnormal,abdominal ultrasound has revealed prominent extrahepatic bile duct, total bilirubin is  elevated. ED physician has discussed with his GI physician who has recommended to get MRCP and admit the patient   # acute abdominal pain Admit to MedSurg unit, clear liquid diet Pain management as needed GI consult  # obstructive jaundice with prominent extrahepatic bile duct up to 8.5 mm Hydrate with IV fluids Pain management mRCP GI consulted and discussed with Dr. Tobi Bastos, he might consider ERCP based on the MRCP results  #chronic history of hepatitis C and liver cirrhosis Continue home medication Aldact[employer] Lasix and potassium supplements for now  #GERD PPI  #Thrombocytopenia secondary to liver cirrhosis No active bleeding or bruising. Monitor platelet count closely  GI prophylaxis with Protonix Daily prophylaxis with SCDs  Tobacco abuse disorder Counseled patient to quit smoking for 4 minutes. Patient verbalized understanding. We will start her on nicotine patch  All the records are reviewed and case discussed with ED provider. Management plans discussed with the patient, family and they are in agreement.  CODE STATUS: fc   TOTAL TIME TAKING CARE OF THIS PATIENT: 45  minutes.   Note: This dictation was prepared with Dragon dictation along with smaller phrase technology. Any transcriptional errors that result from this process are unintentional.  Ramonita Lab M.D on 04/28/2017 at 8:34 PM  Between 7am to 6pm - Pager - [phone]  After 6pm go to [email or web address] - password [email or web address] [facility]  [facility]  Office  [phone]  CC: Primary care physician; [name], MD

## 2017-04-28 NOTE — ED Notes (Signed)
Pt ambulated to the restroom.

## 2017-04-28 NOTE — ED Notes (Signed)
Attempted to call report, receiving nurse states she will call this nurse back in a few minutes after reviewing pt chart

## 2017-04-28 NOTE — ED Triage Notes (Signed)
Pt c/o headache and neck pain.  Ambulatory without difficulty.  Also would like to be seen for LUQ pain and lower abdominal pain. Has been having anxiety worse over last month as well.  Was on medication but has not had in a year.  No SI/HI.  Has had diarrhea but no vomiting.

## 2017-04-28 NOTE — ED Provider Notes (Signed)
[facility] Emergency Department Provider Note   ____________________________________________   I have reviewed the triage vital signs and the nursing notes.   HISTORY  Chief Complaint Abdominal pain  History limited by: Not Limited   HPI Susan [name] is a 46 y.o. female who presents to the emergency department today with primary complaint of abdominal pain. She describes it as being located across her lower abdomen and in the LUQ. The patient states that the pain started roughly 2 days ago. She describes it as feeling like she is having a baby. The patient has had decreased appetite with this. The patient has not had any fevers. In addition the patient states that she has been feeling anxious. This has been going on for the past month. It has been getting worse.    Past Medical History:  Diagnosis Date  . Allergy   . Anemia   . Anxiety   . Asthma   . Cirrhosis (HCC)   . COPD (chronic obstructive pulmonary disease) (HCC) 12/09/2014  . Depression   . Emphysema of lung (HCC)   . GERD (gastroesophageal reflux disease)   . Heart murmur   . Hepatitis C    pt said they told her she had this last visit here  . Hyperlipidemia   . Hypertension     Patient Active Problem List   Diagnosis Date Noted  . Elevated LFTs 04/02/2015  . Hepatic cirrhosis (HCC) 12/12/2014  . Diarrhea 12/09/2014  . Blood in stool 12/09/2014  . Abdominal pain 12/09/2014  . COPD (chronic obstructive pulmonary disease) (HCC) 12/09/2014    Past Surgical History:  Procedure Laterality Date  . CESAREAN SECTION  2009    Prior to Admission medications   Medication Sig Start Date End Date Taking? Authorizing Provider  albuterol (PROVENTIL HFA;VENTOLIN HFA) 108 (90 BASE) MCG/ACT inhaler Inhale 1 puff into the lungs every 4 (four) hours as needed for wheezing or shortness of breath.     [provider]  budesonide-formoterol (SYMBICORT) 80-4.5 MCG/ACT inhaler Inhale 2 puffs  into the lungs 2 (two) times daily.     [provider]  clonazePAM (KLONOPIN) 1 MG tablet Take 1 mg by mouth 2 (two) times daily.     [provider]  escitalopram (LEXAPRO) 20 MG tablet Take 20 mg by mouth at bedtime.     [provider]  furosemide (LASIX) 40 MG tablet Take 40 mg by mouth 2 (two) times daily.     [provider]  hydrOXYzine (ATARAX/VISTARIL) 25 MG tablet Take 1 tablet (25 mg total) by mouth 3 (three) times daily as needed (itching). Patient not taking: Reported on 01/04/2017 04/07/15   Milagros Loll, MD  ipratropium-albuterol (DUONEB) 0.5-2.5 (3) MG/3ML SOLN Take 3 mLs by nebulization every 6 (six) hours as needed (for shortness of breath/wheezing).     [provider]  loratadine (CLARITIN) 10 MG tablet Take 10 mg by mouth daily.     [provider]  omeprazole (PRILOSEC) 40 MG capsule Take 40 mg by mouth daily.     [provider]  Potassium Chloride ER 20 MEQ TBCR Take 40 mEq by mouth daily.  09/28/14   [provider]  spironolactone (ALDACTONE) 100 MG tablet Take 100 mg by mouth daily.     [provider]    Allergies Doxycycline; Latex; Promethazine; Ranitidine; and Zofran [ondansetron hcl]  Family History  Problem Relation Age of Onset  . Other Mother   . Lung cancer Mother   .  Skin cancer Father     Social History Social History  Substance Use Topics  . Smoking status: Current Every Day Smoker    Packs/day: 1.00    Years: 20.00    Types: Cigarettes  . Smokeless tobacco: Never Used  . Alcohol use No     Comment: Quit drinking few years ago.    Review of Systems Constitutional: No fever/chills Eyes: No visual changes. ENT: No sore throat. Cardiovascular: Denies chest pain. Respiratory: Denies shortness of breath. Gastrointestinal: Positive for abdominal pain.  Genitourinary: Negative for dysuria. Musculoskeletal: Negative for back pain. Skin: Negative for  rash. Neurological: Positive for headache Psych: Positive for anxiety.   ____________________________________________   PHYSICAL EXAM:  VITAL SIGNS: ED Triage Vitals  Enc Vitals Group     BP 04/28/17 1158 (!) 154/88     Pulse Rate 04/28/17 1158 (!) 104     Resp 04/28/17 1158 18     Temp 04/28/17 1158 98.2 F (36.8 C)     Temp Source 04/28/17 1158 Oral     SpO2 04/28/17 1158 98 %     Weight 04/28/17 1158 165 lb (74.8 kg)     Height 04/28/17 1158  (1.6 m)     Head Circumference --      Peak Flow --      Pain Score 04/28/17 1157 7   Constitutional: Alert and oriented. Well appearing and in no distress. Eyes: Conjunctivae are normal.  ENT   Head: Normocephalic and atraumatic.   Nose: No congestion/rhinnorhea.   Mouth/Throat: Mucous membranes are moist.   Neck: No stridor. Hematological/Lymphatic/Immunilogical: No cervical lymphadenopathy. Cardiovascular: Normal rate, regular rhythm.  No murmurs, rubs, or gallops.  Respiratory: Normal respiratory effort without tachypnea nor retractions. Breath sounds are clear and equal bilaterally. No wheezes/rales/rhonchi. Gastrointestinal: Soft and non tender. No rebound. No guarding.  Genitourinary: Deferred Musculoskeletal: Normal range of motion in all extremities. No lower extremity edema. Neurologic:  Normal speech and language. No gross focal neurologic deficits are appreciated.  Skin:  Skin is warm, dry and intact. No rash noted. Psychiatric: Mood and affect are normal. Speech and behavior are normal. Patient exhibits appropriate insight and judgment.  ____________________________________________    LABS (pertinent positives/negatives)  Na 130 K 3.5 Cr 0.42 AST 248 ALT 137 T bili 4.2 WBC 6.8 Hgb 15.4 Pl 71   ____________________________________________   EKG  I, Phineas Semen, attending physician, personally viewed and interpreted this EKG  EKG Time: 1207 Rate: 89 Rhythm: normal sinus  rhythm Axis: left axis deviation Intervals: qtc 457 QRS: narrow ST changes: no st elevation Impression: normal    ____________________________________________    RADIOLOGY  [place] RUQ Extrahepatic bile duct 8.67mm  ____________________________________________   PROCEDURES  Procedures  ____________________________________________   INITIAL IMPRESSION / ASSESSMENT AND PLAN / ED COURSE  Pertinent labs & imaging results that were available during my care of the patient were reviewed by me and considered in my medical decision making (see chart for details).  patient presented to the emergency department today with complaints of some anxiety and abdominal pain. Patient's bilirubin was elevated 4.7. Ultrasound did show dilated extra hepatic duct. Discussed with Dr. Tobi Bastos with GI who recommended patient be admitted for MRCP. Will plan on admission to hospital service.  ____________________________________________   FINAL CLINICAL IMPRESSION(S) / ED DIAGNOSES  Final diagnoses:  Abdominal pain  Elevated bilirubin     Note: This dictation was prepared with Dragon dictation. Any transcriptional errors that result from this process are unintentional  Phineas Semen, MD 04/28/17 2015

## 2017-04-28 NOTE — ED Notes (Signed)
Pt transported to room 129 

## 2017-04-29 ENCOUNTER — Inpatient Hospital Stay: Payer: Medicaid Other

## 2017-04-29 DIAGNOSIS — R17 Unspecified jaundice: Secondary | ICD-10-CM

## 2017-04-29 DIAGNOSIS — R1011 Right upper quadrant pain: Secondary | ICD-10-CM

## 2017-04-29 LAB — CBC
HCT: 37.8 % (ref 35.0–47.0)
HEMOGLOBIN: 13.5 g/dL (ref 12.0–16.0)
MCH: 36.5 pg — ABNORMAL HIGH (ref 26.0–34.0)
MCHC: 35.8 g/dL (ref 32.0–36.0)
MCV: 102 fL — ABNORMAL HIGH (ref 80.0–100.0)
Platelets: 62 10*3/uL — ABNORMAL LOW (ref 150–440)
RBC: 3.7 MIL/uL — AB (ref 3.80–5.20)
RDW: 12.7 % (ref 11.5–14.5)
WBC: 5 10*3/uL (ref 3.6–11.0)

## 2017-04-29 LAB — COMPREHENSIVE METABOLIC PANEL
ALBUMIN: 3.8 g/dL (ref 3.5–5.0)
ALT: 112 U/L — AB (ref 14–54)
AST: 186 U/L — AB (ref 15–41)
Alkaline Phosphatase: 101 U/L (ref 38–126)
Anion gap: 8 (ref 5–15)
BUN: 12 mg/dL (ref 6–20)
CHLORIDE: 103 mmol/L (ref 101–111)
CO2: 20 mmol/L — ABNORMAL LOW (ref 22–32)
CREATININE: 0.6 mg/dL (ref 0.44–1.00)
Calcium: 9.1 mg/dL (ref 8.9–10.3)
GFR calc Af Amer: >60 mL/min (ref >60)
GFR calc non Af Amer: >60 mL/min (ref >60)
GLUCOSE: 95 mg/dL (ref 65–99)
POTASSIUM: 3.6 mmol/L (ref 3.5–5.1)
SODIUM: 131 mmol/L — AB (ref 135–145)
Total Bilirubin: 3 mg/dL — ABNORMAL HIGH (ref 0.3–1.2)
Total Protein: 7.6 g/dL (ref 6.5–8.1)

## 2017-04-29 LAB — MRSA PCR SCREENING: MRSA by PCR: NEGATIVE

## 2017-04-29 LAB — GLUCOSE, CAPILLARY: Glucose-Capillary: 118 mg/dL — ABNORMAL HIGH (ref 65–99)

## 2017-04-29 LAB — HEMOGLOBIN A1C
HEMOGLOBIN A1C: 4.8 % (ref 4.8–5.6)
MEAN PLASMA GLUCOSE: 91.06 mg/dL

## 2017-04-29 MED ORDER — SODIUM CHLORIDE 0.9 % IV SOLN: 0.4000 ug/kg/h | INTRAVENOUS | Status: DC

## 2017-04-29 MED ORDER — LORAZEPAM 1 MG PO TABS
1.0000 mg | ORAL_TABLET | Freq: Four times a day (QID) | ORAL | Status: AC | PRN
  Administered 2017-05-02: 01:00:00 1 mg via ORAL
  Filled 2017-04-29: qty 1

## 2017-04-29 MED ORDER — THIAMINE HCL 100 MG/ML IJ SOLN
100.0000 mg | Freq: Every day | INTRAMUSCULAR | Status: DC
  Administered 2017-04-30: 100 mg via INTRAVENOUS
  Filled 2017-04-29: qty 2
  Filled 2017-04-29 (×3): qty 1

## 2017-04-29 MED ORDER — VITAMIN B-1 100 MG PO TABS
100.0000 mg | ORAL_TABLET | Freq: Every day | ORAL | Status: DC
  Administered 2017-04-29 – 2017-05-04 (×5): 100 mg via ORAL
  Filled 2017-04-29 (×5): qty 1

## 2017-04-29 MED ORDER — SODIUM CHLORIDE 0.9 % IV BOLUS (SEPSIS)
1000.0000 mL | Freq: Once | INTRAVENOUS | Status: AC
  Administered 2017-04-29: 1000 mL via INTRAVENOUS

## 2017-04-29 MED ORDER — ADULT MULTIVITAMIN W/MINERALS CH
1.0000 | ORAL_TABLET | Freq: Every day | ORAL | Status: DC
  Administered 2017-05-01 – 2017-05-04 (×4): 1 via ORAL
  Filled 2017-04-29 (×4): qty 1

## 2017-04-29 MED ORDER — FOLIC ACID 1 MG PO TABS
1.0000 mg | ORAL_TABLET | Freq: Every day | ORAL | Status: DC
  Administered 2017-04-29 – 2017-05-04 (×5): 1 mg via ORAL
  Filled 2017-04-29 (×5): qty 1

## 2017-04-29 MED ORDER — LORAZEPAM 2 MG/ML IJ SOLN
2.0000 mg | Freq: Four times a day (QID) | INTRAMUSCULAR | Status: DC | PRN
  Administered 2017-04-29: 20:00:00 2 mg via INTRAVENOUS
  Administered 2017-04-29: 15:00:00 1 mg via INTRAVENOUS
  Administered 2017-04-29: 17:00:00 2 mg via INTRAVENOUS
  Filled 2017-04-29 (×3): qty 1

## 2017-04-29 MED ORDER — PANTOPRAZOLE SODIUM 40 MG PO TBEC
40.0000 mg | DELAYED_RELEASE_TABLET | Freq: Two times a day (BID) | ORAL | Status: DC
  Administered 2017-04-29 – 2017-05-04 (×9): 40 mg via ORAL
  Filled 2017-04-29 (×9): qty 1

## 2017-04-29 MED ORDER — LORAZEPAM 2 MG/ML IJ SOLN
1.0000 mg | Freq: Four times a day (QID) | INTRAMUSCULAR | Status: AC | PRN
  Administered 2017-04-29 – 2017-04-30 (×2): 1 mg via INTRAVENOUS
  Filled 2017-04-29 (×2): qty 1

## 2017-04-29 MED ORDER — OXYCODONE HCL 5 MG PO TABS
10.0000 mg | ORAL_TABLET | Freq: Four times a day (QID) | ORAL | Status: DC | PRN
  Administered 2017-04-29: 10 mg via ORAL
  Filled 2017-04-29: qty 2

## 2017-04-29 MED ORDER — DEXMEDETOMIDINE HCL IN NACL 400 MCG/100ML IV SOLN
0.4000 ug/kg/h | INTRAVENOUS | Status: DC
  Administered 2017-04-29: 1.2 ug/kg/h via INTRAVENOUS
  Filled 2017-04-29: qty 100

## 2017-04-29 NOTE — Consult Note (Signed)
Wyline Mood MD, MRCP(U.K) [address]  [address]  [place], [place] [address]  Main: [phone]  Fax: [phone]  Consultation  Referring Provider: Dr Amado Coe Primary Care Physician:  [name], MD Primary Gastroenterologist:  Dr. Marva Panda       Reason for Consultation:     Abnormal USG  Date of Admission:  04/28/2017 Date of Consultation:  04/29/2017         HPI:   Susan [name] is a 46 y.o. female with known hepatitis C GT 3 with cirrhosis ,alcohol abuse , non compliant to treatment. There was a plan to treat her for hepatitis C in the past which she did not follow up on . She came into the ER with RUQ pain. She say she has never had RUQ pain , started all of a sudden on Wednesday and since has been continious, no nausea or vomiting or fever.  Since coming into the hospital pain is better. Quit drinking alcohol a few months back , consumed for many years. No complaints presently.   Labs 04/28/17- Lipase normal , Tbil 4.2, AST 248,ALT 137 , low sodium and chloride . Hb 15.4 , MCV 101, Plt 71   04/28/17- RUQ USG - dilated CBD 8.5 mm - no gall stones seen .   Past Medical History:  Diagnosis Date  . Allergy   . Anemia   . Anxiety   . Asthma   . Cirrhosis (HCC)   . COPD (chronic obstructive pulmonary disease) (HCC) 12/09/2014  . Depression   . Emphysema of lung (HCC)   . GERD (gastroesophageal reflux disease)   . Heart murmur   . Hepatitis C    pt said they told her she had this last visit here  . Hyperlipidemia   . Hypertension     Past Surgical History:  Procedure Laterality Date  . CESAREAN SECTION  2009    Prior to Admission medications   Medication Sig Start Date End Date Taking? Authorizing Provider  albuterol (PROVENTIL HFA;VENTOLIN HFA) 108 (90 BASE) MCG/ACT inhaler Inhale 1 puff into the lungs every 4 (four) hours as needed for wheezing or shortness of breath.     [provider]  budesonide-formoterol (SYMBICORT) 80-4.5 MCG/ACT inhaler  Inhale 2 puffs into the lungs 2 (two) times daily.     [provider]  clonazePAM (KLONOPIN) 1 MG tablet Take 1 mg by mouth 2 (two) times daily.     [provider]  escitalopram (LEXAPRO) 20 MG tablet Take 20 mg by mouth at bedtime.     [provider]  furosemide (LASIX) 40 MG tablet Take 40 mg by mouth 2 (two) times daily.     [provider]  hydrOXYzine (ATARAX/VISTARIL) 25 MG tablet Take 1 tablet (25 mg total) by mouth 3 (three) times daily as needed (itching). Patient not taking: Reported on 01/04/2017 04/07/15   Milagros Loll, MD  ipratropium-albuterol (DUONEB) 0.5-2.5 (3) MG/3ML SOLN Take 3 mLs by nebulization every 6 (six) hours as needed (for shortness of breath/wheezing).     [provider]  loratadine (CLARITIN) 10 MG tablet Take 10 mg by mouth daily.     [provider]  omeprazole (PRILOSEC) 40 MG capsule Take 40 mg by mouth daily.     [provider]  Potassium Chloride ER 20 MEQ TBCR Take 40 mEq by mouth daily.  09/28/14   [provider]  spironolactone (ALDACTONE) 100 MG tablet Take 100 mg by mouth daily.  [provider]    Family History  Problem Relation Age of Onset  . Other Mother   . Lung cancer Mother   . Skin cancer Father      Social History  Substance Use Topics  . Smoking status: Current Every Day Smoker    Packs/day: 1.00    Years: 20.00    Types: Cigarettes  . Smokeless tobacco: Never Used  . Alcohol use No     Comment: Quit drinking few years ago.    Allergies as of 04/28/2017 - Review Complete 04/28/2017  Allergen Reaction Noted  . Doxycycline Hives 12/09/2014  . Latex Hives 12/09/2014  . Promethazine Diarrhea 12/09/2014  . Ranitidine Nausea Only 12/09/2014  . Zofran [ondansetron hcl] Itching 12/09/2014    Review of Systems:    All systems reviewed and negative except where noted in HPI.   Physical Exam:  Vital signs in last 24 hours: Temp:  [98.2 F (36.8  C)-98.4 F (36.9 C)] 98.2 F (36.8 C) (09/23 0510) Pulse Rate:  [61-104] 61 (09/23 0510) Resp:  [12-23] 20 (09/23 0510) BP: (124-154)/(71-96) 127/71 (09/23 0510) SpO2:  [97 %-100 %] 100 % (09/23 0510) Weight:  [165 lb (74.8 kg)-168 lb 12.8 oz (76.6 kg)] 168 lb 12.8 oz (76.6 kg) (09/22 1930) Last BM Date: 04/27/17 General:   Pleasant, cooperative in NAD Head:  Normocephalic and atraumatic. Eyes:   No icterus.   Conjunctiva pink. PERRLA. Ears:  Normal auditory acuity. Neck:  Supple; no masses or thyroidomegaly Lungs:spider angiomas over her chest ,  Respirations even and unlabored. Lungs clear to auscultation bilaterally.   No wheezes, crackles, or rhonchi.  Heart:  Regular rate and rhythm;  Without murmur, clicks, rubs or gallops Abdomen:  Soft, nondistended, nontender. Normal bowel sounds. No appreciable masses or hepatomegaly.  No rebound or guarding.  Extremities:  Without edema, cyanosis or clubbing. Neurologic:  Alert and oriented x3;  grossly normal neurologically. Skin:  Intact without significant lesions or rashes. Cervical Nodes:  No significant cervical adenopathy. Psych:  Alert and cooperative. Normal affect.  LAB RESULTS:  Recent Labs  04/28/17 1200 04/29/17 0359  WBC 6.8 5.0  HGB 15.4 13.5  HCT 43.6 37.8  PLT 71* 62*   BMET  Recent Labs  04/28/17 1200 04/29/17 0359  NA 130* 131*  K 3.5 3.6  CL 98* 103  CO2 20* 20*  GLUCOSE 113* 95  BUN 11 12  CREATININE 0.42* 0.60  CALCIUM 9.3 9.1   LFT  Recent Labs  04/29/17 0359  PROT 7.6  ALBUMIN 3.8  AST 186*  ALT 112*  ALKPHOS 101  BILITOT 3.0*   PT/INR No results for input(s): LABPROT, INR in the last 72 hours.  STUDIES: US Abdomen Limited Ruq  Result Date: 04/28/2017 CLINICAL DATA:  Abdominal pain with nausea for 5 days EXAM: ULTRASOUND ABDOMEN LIMITED RIGHT UPPER QUADRANT COMPARISON:  11/17/2015 FINDINGS: Gallbladder: No gallstones or wall thickening visualized. No sonographic Murphy sign noted  by sonographer. Common bile duct: Diameter: Maximum diameter of 8.5 mm. Liver: No focal lesion identified. Nodular liver contour, suspicious for cirrhosis. Portal vein is patent on color Doppler imaging with normal direction of blood flow towards the liver. IMPRESSION: 1. Negative for acute cholecystitis 2. Prominent extrahepatic bile duct up to 8.5 mm, suggest correlation with laboratory values 3. Nodular liver contour suspicious for cirrhosis. Electronically Signed   By: Jasmine Pang M.D.   On: 04/28/2017 18:08      Impression / Plan:   Mikaella P  Helmes is a 46 y.o. y/o female with liver cirrhosis from possible alcohol/hepatitis C . Follows with [facility] , admitted with RUQ pain , elevated transaminases, and T bilirubin of 4 , dilated CBD on RUQ USG 8.5 mm. No gall stones seen on MRCP. Differentials are stones in the CBD was papillary stenosis/Sphincter of Oddi dysfunction Type 1.   Plan  1. MRCP and if it shows stones in the CBD will need ERCP tomorrow , if negative may still need ERCP or possible manometry prior to ERCP.  2. Keep NPO from midnight   Thank you for involving me in the care of this patient.      LOS: 1 day   Wyline Mood, MD  04/29/2017, 9:07 AM

## 2017-04-29 NOTE — Progress Notes (Signed)
eLink Physician-Brief Progress Note Patient Name: Susan [name] DOB: 1971/03/19 MRN: [id number]   Date of Service  04/29/2017  HPI/Events of Note  Pt admitted with abd pain/ obst jaundice in setting of ethohism with viz hallucinations and threatened dts  eICU Interventions  Transferred to ICU for precedex drip/ PCCM service to see      Intervention Category Major Interventions: Delirium, psychosis, severe agitation - evaluation and management  Sandrea Hughs 04/29/2017, 9:08 PM

## 2017-04-29 NOTE — Progress Notes (Signed)
[facility] [place] at [facility]                                                                                                                                                                                  Patient Demographics   Susan [name], is a 46 y.o. female, DOB - 1971/05/17, [name]:[id number]  Admit date - 04/28/2017   Admitting Physician Ramonita Lab, MD  Outpatient Primary MD for the patient is [name], MD   LOS - 1  Subjective: Continues to complain of abdominal pain in multiple parts of her abdomen. Was seen by GI , plan for MRCP later today    Review of Systems:   CONSTITUTIONAL: No documented fever. No fatigue, weakness. No weight gain, no weight loss.  EYES: No blurry or double vision.  ENT: No tinnitus. No postnasal drip. No redness of the oropharynx.  RESPIRATORY: No cough, no wheeze, no hemoptysis. No dyspnea.  CARDIOVASCULAR: No chest pain. No orthopnea. No palpitations. No syncope.  GASTROINTESTINAL: No nausea, no vomiting or diarrhea. +abdominal pain. No melena or hematochezia.  GENITOURINARY: No dysuria or hematuria.  ENDOCRINE: No polyuria or nocturia. No heat or cold intolerance.  HEMATOLOGY: No anemia. No bruising. No bleeding.  INTEGUMENTARY: No rashes. No lesions.  MUSCULOSKELETAL: No arthritis. No swelling. No gout.  NEUROLOGIC: No numbness, tingling, or ataxia. No seizure-type activity.  PSYCHIATRIC: No anxiety. No insomnia. No ADD.    Vitals:   Vitals:   04/28/17 2022 04/29/17 0510 04/29/17 0920 04/29/17 1154  BP: (!) 147/83 127/71 114/69 116/61  Pulse: 95 61 (!) 58 71  Resp: Temp: 98.4 F (36.9 C) 98.2 F (36.8 C) 98 F (36.7 C) 97.9 F (36.6 C)  TempSrc: Oral Oral Oral Oral  SpO2: 100% 100% 99% 100%  Weight:      Height:        Wt Readings from Last 3 Encounters:  04/28/17 168 lb 12.8 oz (76.6 kg)  03/06/17 175 lb (79.4 kg)  02/27/17 175 lb (79.4 kg)     Intake/Output Summary (Last 24  hours) at 04/29/17 1337 Last data filed at 04/29/17 0000  Gross per 24 hour  Intake              740 ml  Output                0 ml  Net              740 ml    Physical Exam:   GENERAL: Pleasant-appearing in no apparent distress.  HEAD, EYES, EARS, NOSE AND THROAT: Atraumatic, normocephalic. Extraocular muscles are intact. Pupils equal and reactive  to light. Sclerae anicteric. No conjunctival injection. No oro-pharyngeal erythema.  NECK: Supple. There is no jugular venous distention. No bruits, no lymphadenopathy, no thyromegaly.  HEART: Regular rate and rhythm,. No murmurs, no rubs, no clicks.  LUNGS: Clear to auscultation bilaterally. No rales or rhonchi. No wheezes.  ABDOMEN: Soft, flat, mild epigastric tenderness, nondistended. Has good bowel sounds. No hepatosplenomegaly appreciated.  EXTREMITIES: No evidence of any cyanosis, clubbing, or peripheral edema.  +2 pedal and radial pulses bilaterally.  NEUROLOGIC: The patient is alert, awake, and oriented x3 with no focal motor or sensory deficits appreciated bilaterally.  SKIN: Moist and warm with no rashes appreciated.  Psych: Not anxious, depressed LN: No inguinal LN enlargement    Antibiotics   Anti-infectives    None      Medications   Scheduled Meds: . clonazePAM  1 mg Oral BID  . escitalopram  20 mg Oral QHS  . furosemide  40 mg Oral BID  . loratadine  10 mg Oral Daily  . mometasone-formoterol  2 puff Inhalation BID  . nicotine  14 mg Transdermal Daily  . pantoprazole  40 mg Oral Daily  . potassium chloride SA  40 mEq Oral Daily  . spironolactone  100 mg Oral Daily   Continuous Infusions: PRN Meds:.albuterol, ipratropium-albuterol, LORazepam, morphine injection, oxyCODONE, traMADol   Data Review:   Micro Results No results found for this or any previous visit (from the past 240 hour(s)).  Radiology Reports US Abdomen Limited Ruq  Result Date: 04/28/2017 CLINICAL DATA:  Abdominal pain with nausea for 5  days EXAM: ULTRASOUND ABDOMEN LIMITED RIGHT UPPER QUADRANT COMPARISON:  11/17/2015 FINDINGS: Gallbladder: No gallstones or wall thickening visualized. No sonographic Murphy sign noted by sonographer. Common bile duct: Diameter: Maximum diameter of 8.5 mm. Liver: No focal lesion identified. Nodular liver contour, suspicious for cirrhosis. Portal vein is patent on color Doppler imaging with normal direction of blood flow towards the liver. IMPRESSION: 1. Negative for acute cholecystitis 2. Prominent extrahepatic bile duct up to 8.5 mm, suggest correlation with laboratory values 3. Nodular liver contour suspicious for cirrhosis. Electronically Signed   By: Jasmine Pang M.D.   On: 04/28/2017 18:08     CBC  Recent Labs Lab 04/28/17 1200 04/29/17 0359  WBC 6.8 5.0  HGB 15.4 13.5  HCT 43.6 37.8  PLT 71* 62*  MCV 101.7* 102.0*  MCH 35.9* 36.5*  MCHC 35.3 35.8  RDW 12.8 12.7    Chemistries   Recent Labs Lab 04/28/17 1200 04/29/17 0359  NA 130* 131*  K 3.5 3.6  CL 98* 103  CO2 20* 20*  GLUCOSE 113* 95  BUN 11 12  CREATININE 0.42* 0.60  CALCIUM 9.3 9.1  AST 248* 186*  ALT 137* 112*  ALKPHOS 117 101  BILITOT 4.2* 3.0*   ------------------------------------------------------------------------------------------------------------------ estimated creatinine clearance is 90.9 mL/min (by C-G formula based on SCr of 0.6 mg/dL). ------------------------------------------------------------------------------------------------------------------  Recent Labs  04/29/17 0359  HGBA1C 4.8   ------------------------------------------------------------------------------------------------------------------ No results for input(s): CHOL, HDL, LDLCALC, TRIG, CHOLHDL, LDLDIRECT in the last 72 hours. ------------------------------------------------------------------------------------------------------------------ No results for input(s): TSH, T4TOTAL, T3FREE, THYROIDAB in the last 72  hours.  Invalid input(s): FREET3 ------------------------------------------------------------------------------------------------------------------ No results for input(s): VITAMINB12, FOLATE, FERRITIN, TIBC, IRON, RETICCTPCT in the last 72 hours.  Coagulation profile No results for input(s): INR, PROTIME in the last 168 hours.  No results for input(s): DDIMER in the last 72 hours.  Cardiac Enzymes No results for input(s): CKMB, TROPONINI, MYOGLOBIN in the last 168 hours.  Invalid input(s): CK ------------------------------------------------------------------------------------------------------------------ Invalid input(s): POCBNP    Assessment & Plan  Patient is a 46 year old with abdominal pain  #acute abdominal pain With dilated common bile duct Plan for ERCP I will place her on PPIs in case there gastritis or esophagitis   # obstructive jaundice with prominent extrahepatic bile duct up to 8.5 mm Hydrate with IV fluids Pain management mRCP Today Seen by GI  #chronic history of hepatitis C and liver cirrhosis Continue home medication Aldactone  Lasix and potassium supplements on hold  #GERD PPI  #Thrombocytopenia secondary to liver cirrhosis No active bleeding or bruising. Monitor platelet count closely  Tobacco abuse disorder Admitting physician counsel patient regarding stop smoking     Code Status Orders        Start     Ordered   04/28/17 2015  Full code  Continuous     04/28/17 2014    Code Status History    Date Active Date Inactive Code Status Order ID Comments User Context   04/02/2015  2:10 PM 04/07/2015  5:39 PM Full Code [id number]  Adrian Saran, MD Inpatient   12/09/2014  5:13 PM 12/12/2014  5:06 PM Full Code [id number]  Altamese Dilling, MD Inpatient           Consults  gastroenterology  DVT Prophylaxis  SCDs due to thrombocytopenia  Lab Results  Component Value Date   PLT 62 (L) 04/29/2017     Time Spent in minutes   35  minutes  Greater than 50% of time spent in care coordination and counseling patient regarding the condition and plan of care.   Auburn Bilberry M.D on 04/29/2017 at 1:37 PM  Between 7am to 6pm - Pager - [phone]  After 6pm go to [email or web address] - password [email or web address] [facility]  [facility] [place] [facility]   Office  [phone]

## 2017-04-29 NOTE — Progress Notes (Addendum)
[facility] [place] [facility]   PATIENT NAME: Susan [name]    MR#:  [id number]  DATE OF BIRTH:  1971/03/18  SUBJECTIVE:  CHIEF COMPLAINT:  Called by RN for confusion and patient trying to leave the hospital. Patient is jittery and pleasantly confused during my examination. According to the son patient drinks alcohol n last drink was 3-4 days before; seeing things behind her bed and in the room which doesn't exist  REVIEW OF SYSTEMS:  altered  DRUG ALLERGIES:   Allergies  Allergen Reactions  . Doxycycline Hives  . Latex Hives  . Promethazine Diarrhea  . Ranitidine Nausea Only  . Zofran [Ondansetron Hcl] Itching    VITALS:  Blood pressure 116/61, pulse 71, temperature 97.9 F (36.6 C), temperature source Oral, resp. rate 20, height  (1.626 m), weight 76.6 kg (168 lb 12.8 oz), SpO2 100 %.  PHYSICAL EXAMINATION:  GENERAL:  46 y.o.-year-old patient lying in the bed with no acute distress.  EYES: Pupils equal, round, reactive to light and accommodation. No scleral icterus.  HEENT: Head atraumatic, normocephalic. Oropharynx and nasopharynx clear.  NECK:  Supple, no jugular venous distention. No thyroid enlargement, no tenderness.  LUNGS: Normal breath sounds bilaterally, no wheezing, rales,rhonchi or crepitation. No use of accessory muscles of respiration.  CARDIOVASCULAR: S1, S2 normal. No murmurs, rubs, or gallops.  ABDOMEN: Soft, nontender, nondistended. Bowel sounds present. No organomegaly or mass.  EXTREMITIES: No pedal edema, cyanosis, or clubbing.  NEUROLOGIC: Awake and alert and disoriented. Not cooperating and not following verbal commands jittery PSYCHIATRIC: The patient is alert and disoriented, hallucinating SKIN: No obvious rash, lesion, or ulcer.    LABORATORY PANEL:   CBC  Recent Labs Lab 04/29/17 0359  WBC 5.0  HGB 13.5  HCT 37.8  PLT 62*    ------------------------------------------------------------------------------------------------------------------  Chemistries   Recent Labs Lab 04/29/17 0359  NA 131*  K 3.6  CL 103  CO2 20*  GLUCOSE 95  BUN 12  CREATININE 0.60  CALCIUM 9.1  AST 186*  ALT 112*  ALKPHOS 101  BILITOT 3.0*   ------------------------------------------------------------------------------------------------------------------  Cardiac Enzymes No results for input(s): TROPONINI in the last 168 hours. ------------------------------------------------------------------------------------------------------------------  RADIOLOGY:  US Abdomen Limited Ruq  Result Date: 04/28/2017 CLINICAL DATA:  Abdominal pain with nausea for 5 days EXAM: ULTRASOUND ABDOMEN LIMITED RIGHT UPPER QUADRANT COMPARISON:  11/17/2015 FINDINGS: Gallbladder: No gallstones or wall thickening visualized. No sonographic Murphy sign noted by sonographer. Common bile duct: Diameter: Maximum diameter of 8.5 mm. Liver: No focal lesion identified. Nodular liver contour, suspicious for cirrhosis. Portal vein is patent on color Doppler imaging with normal direction of blood flow towards the liver. IMPRESSION: 1. Negative for acute cholecystitis 2. Prominent extrahepatic bile duct up to 8.5 mm, suggest correlation with laboratory values 3. Nodular liver contour suspicious for cirrhosis. Electronically Signed   By: Jasmine Pang M.D.   On: 04/28/2017 18:08    EKG:   Orders placed or performed during the hospital encounter of 04/28/17  . Susan EKG  . Susan EKG  . EKG 12-Lead  . EKG 12-Lead    ASSESSMENT AND PLAN:     # Acute psychosis-probably from alcohol withdrawal Transfer patient to stepdown unit Stat CT head to rule out acute pathology Check serum alcohol level ciwa Precedex drip We will make the patient IVC for safety, psych consult Discussed with intensivist dr.Wart   #acute abdominal pain With dilated common bile duct Plan  for ERCP on PPIs   #  obstructive jaundice with prominent extrahepatic bile duct up to 8.5 mm Hydrate with IV fluids Pain management mRCP  Seen by GI  #chronic history of hepatitis C and liver cirrhosis Continue home medication Aldactone if patient is cooperative  Lasix and potassium supplements on hold  All the records are reviewed and case discussed with Care Management/Social Workerr. Management plans discussed with the patient, family and they are in agreement.  CODE STATUS: fc   TOTAL CRITICAL CARE TIME TAKING CARE OF THIS PATIENT: 41  minutes.     Note: This dictation was prepared with Dragon dictation along with smaller phrase technology. Any transcriptional errors that result from this process are unintentional.   Ramonita Lab M.D on 04/29/2017 at 7:44 PM  Between 7am to 6pm - Pager - [phone] After 6pm go to [email or web address] - password [email or web address] [facility]  [place] [facility]  Office  [phone]  CC: Primary care physician; [name], MD

## 2017-04-29 NOTE — Progress Notes (Signed)
Patient became combative and resistive to care and pulled off cardiac monitor and IV. Security was present as well as AC. Patient transferred to CCU room 3. Report given to receiving RN.

## 2017-04-29 NOTE — Consult Note (Signed)
PULMONARY / CRITICAL CARE MEDICINE   Name: Susan [name] MRN: [id number] DOB: September 04, 1970    ADMISSION DATE:  04/28/2017   CONSULTATION DATE: 04/29/17  REFERRING MD:  Dr. Amado Coe  REASON: Acute ETOH withdrawal  CHIEF COMPLAINT: confusion and agitation  HISTORY OF PRESENT ILLNESS:   This is a 46 y/o female with a PMH as indicated below, ETOH abuse with last drink 3-4 days ago admitted with RUQ abdominal pain and anorexia. Her ED work up revealed elevated LFTs and possible common bile duct obstruction with a bilirubin level of 4.2 up from 0.8 on July 2018. She was admitted to the floor for further work up and management. This evening, patient became very agitated, confused and belligerent; try to leave AMA. She was deemed to be in acute ETOH withdrawal and transferred to the ICU for further management. She is currently on a precedex gtt and sleeping comfortably Of note, patient is not compliant with any prescribed treatments  PAST MEDICAL HISTORY :  She  has a past medical history of Allergy; Anemia; Anxiety; Asthma; Cirrhosis (HCC); COPD (chronic obstructive pulmonary disease) (HCC) (12/09/2014); Depression; Emphysema of lung (HCC); GERD (gastroesophageal reflux disease); Heart murmur; Hepatitis C; Hyperlipidemia; and Hypertension.  PAST SURGICAL HISTORY: She  has a past surgical history that includes Cesarean section (2009).  Allergies  Allergen Reactions  . Doxycycline Hives  . Latex Hives  . Promethazine Diarrhea  . Ranitidine Nausea Only  . Zofran [Ondansetron Hcl] Itching    No current facility-administered medications on file prior to encounter.    Current Outpatient Prescriptions on File Prior to Encounter  Medication Sig  . albuterol (PROVENTIL HFA;VENTOLIN HFA) 108 (90 BASE) MCG/ACT inhaler Inhale 1 puff into the lungs every 4 (four) hours as needed for wheezing or shortness of breath.   . budesonide-formoterol (SYMBICORT) 80-4.5 MCG/ACT inhaler Inhale 2 puffs into the  lungs 2 (two) times daily.   . clonazePAM (KLONOPIN) 1 MG tablet Take 1 mg by mouth 2 (two) times daily.   Marland Kitchen escitalopram (LEXAPRO) 20 MG tablet Take 20 mg by mouth at bedtime.   . furosemide (LASIX) 40 MG tablet Take 40 mg by mouth 2 (two) times daily.   . hydrOXYzine (ATARAX/VISTARIL) 25 MG tablet Take 1 tablet (25 mg total) by mouth 3 (three) times daily as needed (itching). (Patient not taking: Reported on 01/04/2017)  . ipratropium-albuterol (DUONEB) 0.5-2.5 (3) MG/3ML SOLN Take 3 mLs by nebulization every 6 (six) hours as needed (for shortness of breath/wheezing).   Marland Kitchen loratadine (CLARITIN) 10 MG tablet Take 10 mg by mouth daily.   Marland Kitchen omeprazole (PRILOSEC) 40 MG capsule Take 40 mg by mouth daily.   . Potassium Chloride ER 20 MEQ TBCR Take 40 mEq by mouth daily.   Marland Kitchen spironolactone (ALDACTONE) 100 MG tablet Take 100 mg by mouth daily.     FAMILY HISTORY:  Her indicated that her mother is deceased. She indicated that her father is deceased.    SOCIAL HISTORY: She  reports that she has been smoking Cigarettes.  She has a 20.00 pack-year smoking history. She has never used smokeless tobacco. She reports that she does not drink alcohol or use drugs.  REVIEW OF SYSTEMS:   Unable to obtain as patient is sedated  SUBJECTIVE:   VITAL SIGNS: BP 116/61 (BP Location: Left Arm)   Pulse 71   Temp 97.9 F (36.6 C) (Oral)   Resp 20   Ht  (1.626 m)   Wt 168 lb 12.8 oz (76.6  kg)   SpO2 100%   BMI 28.97 kg/m   HEMODYNAMICS:    VENTILATOR SETTINGS:    INTAKE / OUTPUT: I/O last 3 completed shifts: In: 740 [P.O.:240; IV Piggyback:500] Out: -   PHYSICAL EXAMINATION: General: NAD Neuro:  Withdraws to noxious stimulus HEENT: PERRLA, trachea midline, no JVD Cardiovascular: RRR, S1/S2, no edema, +2 pulses Lungs: CTAB Abdomen: Non-distended, normal bowel sounds, increased liver span Musculoskeletal:  +rom, no deformities Skin: warm and dry  LABS:  BMET  Recent Labs Lab  04/28/17 1200 04/29/17 0359  NA 130* 131*  K 3.5 3.6  CL 98* 103  CO2 20* 20*  BUN 11 12  CREATININE 0.42* 0.60  GLUCOSE 113* 95    Electrolytes  Recent Labs Lab 04/28/17 1200 04/29/17 0359  CALCIUM 9.3 9.1    CBC  Recent Labs Lab 04/28/17 1200 04/29/17 0359  WBC 6.8 5.0  HGB 15.4 13.5  HCT 43.6 37.8  PLT 71* 62*    Coag's No results for input(s): APTT, INR in the last 168 hours.  Sepsis Markers No results for input(s): LATICACIDVEN, PROCALCITON, O2SATVEN in the last 168 hours.  ABG No results for input(s): PHART, PCO2ART, PO2ART in the last 168 hours.  Liver Enzymes  Recent Labs Lab 04/28/17 1200 04/29/17 0359  AST 248* 186*  ALT 137* 112*  ALKPHOS 117 101  BILITOT 4.2* 3.0*  ALBUMIN 4.1 3.8    Cardiac Enzymes No results for input(s): TROPONINI, PROBNP in the last 168 hours.  Glucose No results for input(s): GLUCAP in the last 168 hours.  Imaging No results found.   STUDIES:  MRCP pending  CULTURES: none  ANTIBIOTICS: none  SIGNIFICANT EVENTS: 9/22>admitted 9/23>icu with DTs  LINES/TUBES: PIVs  DISCUSSION: 46 y/o female presenting with acute ETOH withdrawal/DTs  ASSESSMENT  Acute ETOH withdrawal ETOH abuse Liver cirrhosis H/O COPD, HTN, Depression/anxiety, Hep C, chronic pain and GERD  PLAN Hemodynamics per ICU protocol Sitter at bedside for safety CIWA protocol with precedex gtt and prn lorazepam Monitor blood glucose and correct hypoglycemia Keep NPO GI/DVT prophylaxis  FAMILY  - Updates: No family at bedside. Will update when available  - Inter-disciplinary family meet or Palliative Care meeting due by:  day 7  Lakara Weiland S. [facility] ANP-BC Pulmonary and Critical Care Medicine [facility] Pager [phone] or [phone] 04/29/2017, 8:30 PM

## 2017-04-30 ENCOUNTER — Inpatient Hospital Stay: Payer: Medicaid Other

## 2017-04-30 DIAGNOSIS — K831 Obstruction of bile duct: Secondary | ICD-10-CM

## 2017-04-30 DIAGNOSIS — F10931 Alcohol use, unspecified with withdrawal delirium: Secondary | ICD-10-CM

## 2017-04-30 DIAGNOSIS — F10231 Alcohol dependence with withdrawal delirium: Secondary | ICD-10-CM

## 2017-04-30 LAB — COMPREHENSIVE METABOLIC PANEL
ALT: 155 U/L — AB (ref 14–54)
AST: 293 U/L — AB (ref 15–41)
Albumin: 3.3 g/dL — ABNORMAL LOW (ref 3.5–5.0)
Alkaline Phosphatase: 85 U/L (ref 38–126)
Anion gap: 5 (ref 5–15)
BILIRUBIN TOTAL: 2.5 mg/dL — AB (ref 0.3–1.2)
BUN: 10 mg/dL (ref 6–20)
CO2: 22 mmol/L (ref 22–32)
Calcium: 8.9 mg/dL (ref 8.9–10.3)
Chloride: 105 mmol/L (ref 101–111)
Glucose, Bld: 100 mg/dL — ABNORMAL HIGH (ref 65–99)
Potassium: 3.7 mmol/L (ref 3.5–5.1)
Sodium: 132 mmol/L — ABNORMAL LOW (ref 135–145)
TOTAL PROTEIN: 7.1 g/dL (ref 6.5–8.1)

## 2017-04-30 LAB — MAGNESIUM: Magnesium: 1.8 mg/dL (ref 1.7–2.4)

## 2017-04-30 LAB — PHOSPHORUS: Phosphorus: 3.3 mg/dL (ref 2.5–4.6)

## 2017-04-30 LAB — GLUCOSE, CAPILLARY
GLUCOSE-CAPILLARY: 100 mg/dL — AB (ref 65–99)
GLUCOSE-CAPILLARY: 107 mg/dL — AB (ref 65–99)
Glucose-Capillary: 98 mg/dL (ref 65–99)

## 2017-04-30 LAB — ETHANOL

## 2017-04-30 MED ORDER — SODIUM CHLORIDE 0.9 % IV SOLN: INTRAVENOUS | Status: DC

## 2017-04-30 MED ORDER — GADOBENATE DIMEGLUMINE 529 MG/ML IV SOLN
15.0000 mL | Freq: Once | INTRAVENOUS | Status: AC | PRN
  Administered 2017-04-30: 15 mL via INTRAVENOUS

## 2017-04-30 MED ORDER — DEXTROSE-NACL 5-0.45 % IV SOLN
INTRAVENOUS | Status: DC
  Administered 2017-04-30 (×2): via INTRAVENOUS

## 2017-04-30 MED ORDER — CLONAZEPAM 0.5 MG PO TBDP
1.0000 mg | ORAL_TABLET | Freq: Two times a day (BID) | ORAL | Status: DC
  Administered 2017-04-30 – 2017-05-04 (×7): 1 mg via ORAL
  Filled 2017-04-30: qty 1
  Filled 2017-04-30 (×8): qty 2

## 2017-04-30 MED ORDER — FUROSEMIDE 20 MG PO TABS
20.0000 mg | ORAL_TABLET | Freq: Two times a day (BID) | ORAL | Status: DC
  Administered 2017-05-01 – 2017-05-04 (×7): 20 mg via ORAL
  Filled 2017-04-30 (×8): qty 1

## 2017-04-30 NOTE — Progress Notes (Signed)
Pt valuables sent to safe with security. Including 1 pack of cigarettes, with four cigarettes in pack, 1 lighter, 1 LG cell phone, 2 debit cards, 1 necklace with two rings and a heart shaped charm, I pair earrings, 1 Breeze 2 vaping device.  Pt valuables verified with Eber Jones, NT sitter at bedside.  Will continue to assess.

## 2017-04-30 NOTE — Progress Notes (Signed)
MRI called and notified of that patient is now more coherent and able to participate. Pt has agreed to try MRCP scan again. MRI notified this RN the 1st slot would be around 8 pm.  Will continue to assess.

## 2017-04-30 NOTE — Clinical Social Work Note (Signed)
Nursing contacted this CSW to notify that patient has two minor children ages 66 and 3, their whereabouts are unknown. Nursing reported that the last they were seen was up in ICU last evening when a "neighbor" took them home when patient became agitated. CSW contacted the friend in patient's chart: Abe People: [phone] and he stated patient resides with him. He stated that the 2 kids were possibly with a lady named Rivka Barbara but he has no number, address for her. CSW has made a DSS [place] report for concern for the children's safety. York Spaniel MSW,LCSW [phone]

## 2017-04-30 NOTE — Plan of Care (Signed)
Problem: Education: Goal: Knowledge of [facility] Education information/materials will improve Outcome: Progressing Pt now alert and oriented and able to receive education. Pt oriented to unit and staff, pt provided with the welcome packet. Pt now interactive with staff appropriately.  Problem: Safety: Goal: Ability to remain free from injury will improve Outcome: Progressing Pt has remained free from injury on my shift. Pt has not required Precedex or Ativan on my shift. Pt's sitter remains at bedside for safety.  Problem: Pain Managment: Goal: General experience of comfort will improve Outcome: Progressing Patient has remained free from pain on my shift.

## 2017-04-30 NOTE — Progress Notes (Signed)
Pt's neighbor who has the children. Called requesting that the children be allowed to come visit, stating  "they are very concerned about their mother". The neighbor was advised that the patient is not able to have visitors at this time and to feel free to call back and check later.  She left her name and phone number.  Ronal Fear [phone]- cell phone [phone]- home phone.  She states the teenager is currently at school and the youngest who attends year round school is currently home with her.  This information was passed on to [place], Visual merchandiser. Will continue to assess

## 2017-04-30 NOTE — Progress Notes (Signed)
Pt remains NPO in order to be able to receive MRCP. Will continue to assess.

## 2017-04-30 NOTE — Progress Notes (Signed)
[facility]                                                                                                                                                                                  Patient Demographics   Susan [name], is a 46 y.o. female, DOB - 03/12/1971, ZOX:[id number]  Admit date - 04/28/2017   Admitting Physician Ramonita Lab, MD  Outpatient Primary MD for the patient is [name], MD   LOS - 2  Subjective: Patient now transferred to the ICU with confusion and withdrawal On admission she stated that she hadn't been drinking for past few months but son stated that last time she drank 3 days ago   Review of Systems:   CONSTITUTIONAL: Confused on Precedex drip  Vitals:   Vitals:   04/30/17 0600 04/30/17 0700 04/30/17 0800 04/30/17 1300  BP:   Pulse: 74 78 75   Resp: 20 20 (!) 28   Temp:   98.5 F (36.9 C) 98.3 F (36.8 C)  TempSrc:   Oral Oral  SpO2: 93% 92% 92%   Weight:      Height:        Wt Readings from Last 3 Encounters:  04/28/17 168 lb 12.8 oz (76.6 kg)  03/06/17 175 lb (79.4 kg)  02/27/17 175 lb (79.4 kg)     Intake/Output Summary (Last 24 hours) at 04/30/17 1457 Last data filed at 04/30/17 1318  Gross per 24 hour  Intake              575 ml  Output                1 ml  Net              574 ml    Physical Exam:   GENERAL: Confused HEAD, EYES, EARS, NOSE AND THROAT: Atraumatic, normocephalic. Extraocular muscles are intact. Pupils equal and reactive to light. Sclerae anicteric. No conjunctival injection. No oro-pharyngeal erythema.  NECK: Supple. There is no jugular venous distention. No bruits, no lymphadenopathy, no thyromegaly.  HEART: Regular rate and rhythm,. No murmurs, no rubs, no clicks.  LUNGS: Clear to auscultation bilaterally. No rales or rhonchi. No wheezes.  ABDOMEN: Soft, flat, mild epigastric tenderness, nondistended. Has good bowel sounds. No hepatosplenomegaly  appreciated.  EXTREMITIES: No evidence of any cyanosis, clubbing, or peripheral edema.  +2 pedal and radial pulses bilaterally.  NEUROLOGIC: Confused  SKIN: Moist and warm with no rashes appreciated.  Psych: Confused  LN: No inguinal LN enlargement    Antibiotics   Anti-infectives    None      Medications  Scheduled Meds: . clonazePAM  1 mg Oral BID  . escitalopram  20 mg Oral QHS  . folic acid  1 mg Oral Daily  . furosemide  20 mg Oral BID  . loratadine  10 mg Oral Daily  . mometasone-formoterol  2 puff Inhalation BID  . multivitamin with minerals  1 tablet Oral Daily  . nicotine  14 mg Transdermal Daily  . pantoprazole  40 mg Oral BID  . potassium chloride SA  40 mEq Oral Daily  . spironolactone  100 mg Oral Daily  . thiamine  100 mg Oral Daily   Or  . thiamine  100 mg Intravenous Daily   Continuous Infusions: . dexmedetomidine (PRECEDEX) IV infusion Stopped (04/29/17 2330)  . dextrose 5 % and 0.45% NaCl 75 mL/hr at 04/30/17 0520   PRN Meds:.albuterol, ipratropium-albuterol, LORazepam **OR** LORazepam, morphine injection, oxyCODONE, traMADol   Data Review:   Micro Results Recent Results (from the past 240 hour(s))  MRSA PCR Screening     Status: None   Collection Time: 04/29/17  9:09 PM  Result Value Ref Range Status   MRSA by PCR NEGATIVE NEGATIVE Final    Comment:        The GeneXpert MRSA Assay (FDA approved for NASAL specimens only), is one component of a comprehensive MRSA colonization surveillance program. It is not intended to diagnose MRSA infection nor to guide or monitor treatment for MRSA infections.     Radiology Reports Ct Head Wo Contrast  Result Date: 04/29/2017 CLINICAL DATA:  Combative, acute psychosis.  Alcohol withdrawal. EXAM: CT HEAD WITHOUT CONTRAST TECHNIQUE: Contiguous axial images were obtained from the base of the skull through the vertex without intravenous contrast. COMPARISON:  CT HEAD February 27, 2017 FINDINGS: BRAIN: No  intraparenchymal hemorrhage, mass effect nor midline shift. The ventricles and sulci are normal. No acute large vascular territory infarcts. No abnormal extra-axial fluid collections. Basal cisterns are patent. VASCULAR: Trace calcific atherosclerosis. SKULL/SOFT TISSUES: No skull fracture. No significant soft tissue swelling. Skin staple LEFT parietal scalp. ORBITS/SINUSES: The included ocular globes and orbital contents are normal.The mastoid aircells and included paranasal sinuses are well-aerated. OTHER: None. IMPRESSION: 1. Negative noncontrast CT HEAD. 2. Residual staple LEFT parietal scalp. Electronically Signed   By: Awilda Metro M.D.   On: 04/29/2017 23:04   US Abdomen Limited Ruq  Result Date: 04/28/2017 CLINICAL DATA:  Abdominal pain with nausea for 5 days EXAM: ULTRASOUND ABDOMEN LIMITED RIGHT UPPER QUADRANT COMPARISON:  11/17/2015 FINDINGS: Gallbladder: No gallstones or wall thickening visualized. No sonographic Murphy sign noted by sonographer. Common bile duct: Diameter: Maximum diameter of 8.5 mm. Liver: No focal lesion identified. Nodular liver contour, suspicious for cirrhosis. Portal vein is patent on color Doppler imaging with normal direction of blood flow towards the liver. IMPRESSION: 1. Negative for acute cholecystitis 2. Prominent extrahepatic bile duct up to 8.5 mm, suggest correlation with laboratory values 3. Nodular liver contour suspicious for cirrhosis. Electronically Signed   By: Jasmine Pang M.D.   On: 04/28/2017 18:08     CBC  Recent Labs Lab 04/28/17 1200 04/29/17 0359  WBC 6.8 5.0  HGB 15.4 13.5  HCT 43.6 37.8  PLT 71* 62*  MCV 101.7* 102.0*  MCH 35.9* 36.5*  MCHC 35.3 35.8  RDW 12.8 12.7    Chemistries   Recent Labs Lab 04/28/17 1200 04/29/17 0359 04/30/17 0350  NA 130* 131* 132*  K 3.5 3.6 3.7  CL 98* 103 105  CO2 20* 20* 22  GLUCOSE  113* 95 100*  BUN CREATININE 0.42* 0.60 <0.30*  CALCIUM 9.3 9.1 8.9  MG  --   --  1.8  AST  248* 186* 293*  ALT 137* 112* 155*  ALKPHOS 117 101 85  BILITOT 4.2* 3.0* 2.5*   ------------------------------------------------------------------------------------------------------------------ CrCl cannot be calculated (This lab value cannot be used to calculate CrCl because it is not a number: <0.30). ------------------------------------------------------------------------------------------------------------------  Recent Labs  04/29/17 0359  HGBA1C 4.8   ------------------------------------------------------------------------------------------------------------------ No results for input(s): CHOL, HDL, LDLCALC, TRIG, CHOLHDL, LDLDIRECT in the last 72 hours. ------------------------------------------------------------------------------------------------------------------ No results for input(s): TSH, T4TOTAL, T3FREE, THYROIDAB in the last 72 hours.  Invalid input(s): FREET3 ------------------------------------------------------------------------------------------------------------------ No results for input(s): VITAMINB12, FOLATE, FERRITIN, TIBC, IRON, RETICCTPCT in the last 72 hours.  Coagulation profile No results for input(s): INR, PROTIME in the last 168 hours.  No results for input(s): DDIMER in the last 72 hours.  Cardiac Enzymes No results for input(s): CKMB, TROPONINI, MYOGLOBIN in the last 168 hours.  Invalid input(s): CK ------------------------------------------------------------------------------------------------------------------ Invalid input(s): POCBNP    Assessment & Plan  Patient is a 46 year old with abdominal pain  #: alcohol withdrawal  Continue CIWA protocol and Precedex drip   #acute abdominal pain With dilated common bile duct GI following   # obstructive jaundice with prominent extrahepatic bile duct up to 8.5 mm Hydrate with IV fluids Pain management Further workup by GI once stable   #chronic history of hepatitis C and liver  cirrhosis Continue home medication Aldactone  Lasix and potassium supplements on hold  #GERD PPI  #Thrombocytopenia secondary to liver cirrhosis No active bleeding or bruising. Monitor platelet count closely  Tobacco abuse disorder Admitting physician counsel patient regarding stop smoking     Code Status Orders        Start     Ordered   04/28/17 2015  Full code  Continuous     04/28/17 2014    Code Status History    Date Active Date Inactive Code Status Order ID Comments User Context   04/02/2015  2:10 PM 04/07/2015  5:39 PM Full Code [id number]  Adrian Saran, MD Inpatient   12/09/2014  5:13 PM 12/12/2014  5:06 PM Full Code [id number]  Altamese Dilling, MD Inpatient           Consults  gastroenterology  DVT Prophylaxis  SCDs due to thrombocytopenia  Lab Results  Component Value Date   PLT 62 (L) 04/29/2017     Time Spent in minutes   35 minutes  Greater than 50% of time spent in care coordination and counseling patient regarding the condition and plan of care.   Auburn Bilberry M.D on 04/30/2017 at 2:57 PM  Between 7am to 6pm - Pager - [phone]  After 6pm go to [email or web address] - password [email or web address] [facility]  [facility]   Office  [phone]

## 2017-04-30 NOTE — Progress Notes (Signed)
During rounds concerns were brought up regarding pt's alcohol level resulting at 425 on 9/24 after being admitted to the floor since  9/22. Pt brought to unit for hallucinations and being physically aggressive towards staff. Concerns regarding alcohol level despite length of admission. Visitors will be prohibited at this time per Dr. Belia Heman. Will continue to assess.

## 2017-04-30 NOTE — Progress Notes (Signed)
Pt is now more alert and oriented. She is currently speaking with her boyfriend Rosanne Ashing on the phone. Sitter at bedside. Pt remains in stable condition at this time. Pt has not required Precedex or Ativan on my shift. Full assessment in EPIC. Report given to [place], Charity fundraiser.

## 2017-04-30 NOTE — Progress Notes (Signed)
Chaplain was making rounds and visited with pt in room ICU3. Chaplain provided the ministry of prayer and a spiritual presence. Chaplain is available for followup as needed.    04/30/17 1115  Clinical Encounter Type  Visited With Patient  Visit Type Initial;Spiritual support  Referral From Nurse  Consult/Referral To Chaplain  Spiritual Encounters  Spiritual Needs Prayer

## 2017-04-30 NOTE — Clinical Social Work Note (Signed)
CSW informed by patient's nurse that [facility] called and provided her numbers. CSW has contacted DSS CPS of [place] and spoken with intake and provided them with [name]'s information. York Spaniel MSW,LCSW [phone]

## 2017-04-30 NOTE — Clinical Social Work Note (Signed)
CSW consulted for unknown reason. CSW reviewed chart and suspects it to be due to patient's alcoholism. CSW has noted patient is actively withdrawing. CSW will complete assessment once more appropriate. York Spaniel MSW,LCSW [phone]

## 2017-04-30 NOTE — Progress Notes (Signed)
Wyline Mood MD, MRCP(U.K) [address]  [address]  [place], [place] [address]  Main: [phone]    Susan [name] is being followed for abnromal LFT's  Day 2 of follow up   Subjective: Transferred to ICU for delirium. She is presently very drowsy , does not appear uncomfortable.    Objective: Vital signs in last 24 hours: Vitals:   04/30/17 0600 04/30/17 0700 04/30/17 0800 04/30/17 1300  BP:   Pulse: 74 78 75   Resp: 20 20 (!) 28   Temp:   98.5 F (36.9 C) 98.3 F (36.8 C)  TempSrc:   Oral Oral  SpO2: 93% 92% 92%   Weight:      Height:       Weight change:   Intake/Output Summary (Last 24 hours) at 04/30/17 1441 Last data filed at 04/30/17 1318  Gross per 24 hour  Intake              575 ml  Output                1 ml  Net              574 ml     Exam: Heart:: Regular rate and rhythm, S1S2 present or without murmur or extra heart sounds Lungs: normal, clear to auscultation and clear to auscultation and percussion Abdomen: mildly distended, soft , non tender , bs+   Lab Results: @ Micro Results: Recent Results (from the past 240 hour(s))  MRSA PCR Screening     Status: None   Collection Time: 04/29/17  9:09 PM  Result Value Ref Range Status   MRSA by PCR NEGATIVE NEGATIVE Final    Comment:        The GeneXpert MRSA Assay (FDA approved for NASAL specimens only), is one component of a comprehensive MRSA colonization surveillance program. It is not intended to diagnose MRSA infection nor to guide or monitor treatment for MRSA infections.    Studies/Results: Ct Head Wo Contrast  Result Date: 04/29/2017 CLINICAL DATA:  Combative, acute psychosis.  Alcohol withdrawal. EXAM: CT HEAD WITHOUT CONTRAST TECHNIQUE: Contiguous axial images were obtained from the base of the skull through the vertex without intravenous contrast. COMPARISON:  CT HEAD February 27, 2017 FINDINGS: BRAIN: No intraparenchymal hemorrhage, mass effect nor  midline shift. The ventricles and sulci are normal. No acute large vascular territory infarcts. No abnormal extra-axial fluid collections. Basal cisterns are patent. VASCULAR: Trace calcific atherosclerosis. SKULL/SOFT TISSUES: No skull fracture. No significant soft tissue swelling. Skin staple LEFT parietal scalp. ORBITS/SINUSES: The included ocular globes and orbital contents are normal.The mastoid aircells and included paranasal sinuses are well-aerated. OTHER: None. IMPRESSION: 1. Negative noncontrast CT HEAD. 2. Residual staple LEFT parietal scalp. Electronically Signed   By: Awilda Metro M.D.   On: 04/29/2017 23:04   US Abdomen Limited Ruq  Result Date: 04/28/2017 CLINICAL DATA:  Abdominal pain with nausea for 5 days EXAM: ULTRASOUND ABDOMEN LIMITED RIGHT UPPER QUADRANT COMPARISON:  11/17/2015 FINDINGS: Gallbladder: No gallstones or wall thickening visualized. No sonographic Murphy sign noted by sonographer. Common bile duct: Diameter: Maximum diameter of 8.5 mm. Liver: No focal lesion identified. Nodular liver contour, suspicious for cirrhosis. Portal vein is patent on color Doppler imaging with normal direction of blood flow towards the liver. IMPRESSION: 1. Negative for acute cholecystitis 2. Prominent extrahepatic bile duct up to 8.5 mm, suggest correlation with laboratory values 3. Nodular liver contour suspicious for cirrhosis. Electronically  Signed   By: Jasmine Pang M.D.   On: 04/28/2017 18:08   Medications: I have reviewed the patient's current medications. Scheduled Meds: . clonazePAM  1 mg Oral BID  . escitalopram  20 mg Oral QHS  . folic acid  1 mg Oral Daily  . furosemide  20 mg Oral BID  . loratadine  10 mg Oral Daily  . mometasone-formoterol  2 puff Inhalation BID  . multivitamin with minerals  1 tablet Oral Daily  . nicotine  14 mg Transdermal Daily  . pantoprazole  40 mg Oral BID  . potassium chloride SA  40 mEq Oral Daily  . spironolactone  100 mg Oral Daily  .  thiamine  100 mg Oral Daily   Or  . thiamine  100 mg Intravenous Daily   Continuous Infusions: . dexmedetomidine (PRECEDEX) IV infusion Stopped (04/29/17 2330)  . dextrose 5 % and 0.45% NaCl 75 mL/hr at 04/30/17 0520   PRN Meds:.albuterol, ipratropium-albuterol, LORazepam **OR** LORazepam, morphine injection, oxyCODONE, traMADol   Assessment: Active Problems:   Acute abdominal pain   Biliary obstruction   Delirium tremens (HCC)   Alcohol withdrawal syndrome, with delirium (HCC)    CMP Latest Ref Rng & Units 04/30/2017 04/29/2017 04/28/2017  Glucose 65 - 99 mg/dL 161(W) 95 960(A)  BUN 6 - 20 mg/dL Creatinine 0.44 - 1.00 mg/dL <5.40(J) 8.11 9.14(N)  Sodium 135 - 145 mmol/L 132(L) 131(L) 130(L)  Potassium 3.5 - 5.1 mmol/L 3.7 3.6 3.5  Chloride 101 - 111 mmol/L 105 103 98(L)  CO2 22 - 32 mmol/L 22 20(L) 20(L)  Calcium 8.9 - 10.3 mg/dL 8.9 9.1 9.3  Total Protein 6.5 - 8.1 g/dL 7.1 7.6 8.2(N)  Total Bilirubin 0.3 - 1.2 mg/dL 2.5(H) 3.0(H) 4.2(H)  Alkaline Phos 38 - 126 U/L 85 101 117  AST 15 - 41 U/L 293(H) 186(H) 248(H)  ALT 14 - 54 U/L 155(H) 112(H) 137(H)   Susan [name] is a 46 y.o. y/o female with liver cirrhosis from possible alcohol/hepatitis C . Follows with [facility] , admitted with RUQ pain , elevated transaminases, and T bilirubin of 4 , dilated CBD on RUQ USG 8.5 mm. No gall stones seen on CT scan  Differentials are stones in the CBD was papillary stenosis/Sphincter of Oddi dysfunction Type 1.   Plan  1. MRCP and if it shows stones in the CBD will need ERCP      LOS: 2 days   Wyline Mood 04/30/2017, 2:41 PM

## 2017-04-30 NOTE — Progress Notes (Signed)
Have received multiple calls from patients boyfriend Rosanne Ashing. Requesting update, he does not have a password for the  patient.  Education was provided on why this nurse could not give out information over the phone without a password. Pt's boyfriend became very irritable on the phone and verbally aggressive, shouting at this nurse. This re educated on the reasons behind requiring a password, the HIPPA law, and that without the him having the patient password I could not give information.  Pt's boyfriend then ended the call.

## 2017-04-30 NOTE — Consult Note (Signed)
[facility] Face-to-Face Psychiatry Consult   Reason for Consult:  Consult for 46 year old woman with a history of alcohol dependence and cirrhosis who came into the hospital with abdominal pain. Consult was for "psychosis" Referring Physician:  Gouru Patient Identification: Susan [name] MRN:  [id number] Principal Diagnosis: Delirium tremens [facility]) Diagnosis:   Patient Active Problem List   Diagnosis Date Noted  . Biliary obstruction [K83.1]   . Delirium tremens ([place]) [F10.231]   . Alcohol withdrawal syndrome, with delirium ([place]) [F10.231]   . Acute abdominal pain [R10.9] 04/28/2017  . Elevated LFTs [R79.89] 04/02/2015  . Hepatic cirrhosis ([place]) [K74.60] 12/12/2014  . Diarrhea [R19.7] 12/09/2014  . Blood in stool [K92.1] 12/09/2014  . Abdominal pain [R10.9] 12/09/2014  . COPD (chronic obstructive pulmonary disease) ([place]) [J44.9] 12/09/2014    Total Time spent with patient: 45 minutes  Subjective:   Susan [name] is a 46 y.o. female patient admitted with patient not able to give any information.  HPI:  Chart reviewed. Came to see patient. Patient is asleep and difficult to arouse. When she does start to wake up she appears to be confused and falls back to sleep. Nursing tells me she has been sleeping most of the day. There seems to be little benefit in trying to arouse her anymore at this point. Patient came into the hospital a couple days ago with abdominal pain. Was admitted to the medical service and to the intensive care unit. Developed confusion and visual hallucinations. Notes suggest that the family says she stopped drinking a few days before presenting to the hospital. No alcohol level was done on admission. Labs would be consistent with alcohol abuse.  Social history: Uncertain to me. It sounds like she may live with family.  Medical history: Chronic cirrhosis related both to alcohol abuse and hepatitis C. COPD. Biliary obstruction.  Substance abuse history: Long-standing alcohol  abuse. Continues to drink intermittently. Unknown how much sobriety she has had or how involved she has been in treatment in the past  Past Psychiatric History: Patient is chronically prescribed clonazepam 1 mg twice a day and Lexapro 20 mg a day for anxiety. No known prior history of hospitalization or suicide attempts no history of psychosis  Risk to Self: Is patient at risk for suicide?: No Risk to Others:   Prior Inpatient Therapy:   Prior Outpatient Therapy:    Past Medical History:  Past Medical History:  Diagnosis Date  . Allergy   . Anemia   . Anxiety   . Asthma   . Cirrhosis ([place])   . COPD (chronic obstructive pulmonary disease) ([place]) 12/09/2014  . Depression   . Emphysema of lung ([place])   . GERD (gastroesophageal reflux disease)   . Heart murmur   . Hepatitis C    pt said they told her she had this last visit here  . Hyperlipidemia   . Hypertension     Past Surgical History:  Procedure Laterality Date  . CESAREAN SECTION  2009   Family History:  Family History  Problem Relation Age of Onset  . Other Mother   . Lung cancer Mother   . Skin cancer Father    Family Psychiatric  History: Unknown Social History:  History  Alcohol Use No    Comment: Quit drinking few years ago.     History  Drug Use No    Social History   Social History  . Marital status: Single    Spouse name: N/A  . Number of children:  N/A  . Years of education: N/A   Social History Main Topics  . Smoking status: Current Every Day Smoker    Packs/day: 1.00    Years: 20.00    Types: Cigarettes  . Smokeless tobacco: Never Used  . Alcohol use No     Comment: Quit drinking few years ago.  . Drug use: No  . Sexual activity: Not Asked   Other Topics Concern  . None   Social History Narrative  . None   Additional Social History:    Allergies:   Allergies  Allergen Reactions  . Doxycycline Hives  . Latex Hives  . Promethazine Diarrhea  . Ranitidine Nausea Only  . Zofran  [Ondansetron Hcl] Itching    Labs:  Results for orders placed or performed during the hospital encounter of 04/28/17 (from the past 48 hour(s))  Hemoglobin A1c     Status: None   Collection Time: 04/29/17  3:59 AM  Result Value Ref Range   Hgb A1c MFr Bld 4.8 4.8 - 5.6 %    Comment: (NOTE) Pre diabetes:          5.7%-6.4% Diabetes:              >6.4% Glycemic control for   <7.0% adults with diabetes    Mean Plasma Glucose 91.06 mg/dL    Comment: Performed at [facility] [address] [place], [place] [address]  CBC     Status: Abnormal   Collection Time: 04/29/17  3:59 AM  Result Value Ref Range   WBC 5.0 3.6 - 11.0 K/uL   RBC 3.70 (L) 3.80 - 5.20 MIL/uL   Hemoglobin 13.5 12.0 - 16.0 g/dL   HCT 37.8 35.0 - 47.0 %   MCV 102.0 (H) 80.0 - 100.0 fL   MCH 36.5 (H) 26.0 - 34.0 pg   MCHC 35.8 32.0 - 36.0 g/dL   RDW 12.7 11.5 - 14.5 %   Platelets 62 (L) 150 - 440 K/uL  Comprehensive metabolic panel     Status: Abnormal   Collection Time: 04/29/17  3:59 AM  Result Value Ref Range   Sodium 131 (L) 135 - 145 mmol/L   Potassium 3.6 3.5 - 5.1 mmol/L   Chloride 103 101 - 111 mmol/L   CO2 20 (L) 22 - 32 mmol/L   Glucose, Bld 95 65 - 99 mg/dL   BUN 12 6 - 20 mg/dL   Creatinine, Ser 0.60 0.44 - 1.00 mg/dL   Calcium 9.1 8.9 - 10.3 mg/dL   Total Protein 7.6 6.5 - 8.1 g/dL   Albumin 3.8 3.5 - 5.0 g/dL   AST 186 (H) 15 - 41 U/L   ALT 112 (H) 14 - 54 U/L   Alkaline Phosphatase 101 38 - 126 U/L   Total Bilirubin 3.0 (H) 0.3 - 1.2 mg/dL   GFR calc non Af Amer >60 >60 mL/min   GFR calc Af Amer >60 >60 mL/min    Comment: (NOTE) The eGFR has been calculated using the CKD EPI equation. This calculation has not been validated in all clinical situations. eGFR's persistently <60 mL/min signify possible Chronic Kidney Disease.    Anion gap 8 5 - 15  Glucose, capillary     Status: Abnormal   Collection Time: 04/29/17  8:43 PM  Result Value Ref Range   Glucose-Capillary 118 (H) 65 -  99 mg/dL  MRSA PCR Screening     Status: None   Collection Time: 04/29/17  9:09 PM  Result  Value Ref Range   MRSA by PCR NEGATIVE NEGATIVE    Comment:        The GeneXpert MRSA Assay (FDA approved for NASAL specimens only), is one component of a comprehensive MRSA colonization surveillance program. It is not intended to diagnose MRSA infection nor to guide or monitor treatment for MRSA infections.   Comprehensive metabolic panel     Status: Abnormal   Collection Time: 04/30/17  3:50 AM  Result Value Ref Range   Sodium 132 (L) 135 - 145 mmol/L   Potassium 3.7 3.5 - 5.1 mmol/L   Chloride 105 101 - 111 mmol/L   CO2 22 22 - 32 mmol/L   Glucose, Bld 100 (H) 65 - 99 mg/dL   BUN 10 6 - 20 mg/dL   Creatinine, Ser <0.30 (L) 0.44 - 1.00 mg/dL   Calcium 8.9 8.9 - 10.3 mg/dL   Total Protein 7.1 6.5 - 8.1 g/dL   Albumin 3.3 (L) 3.5 - 5.0 g/dL   AST 293 (H) 15 - 41 U/L   ALT 155 (H) 14 - 54 U/L   Alkaline Phosphatase 85 38 - 126 U/L   Total Bilirubin 2.5 (H) 0.3 - 1.2 mg/dL   GFR calc non Af Amer NOT CALCULATED >60 mL/min   GFR calc Af Amer NOT CALCULATED >60 mL/min    Comment: (NOTE) The eGFR has been calculated using the CKD EPI equation. This calculation has not been validated in all clinical situations. eGFR's persistently <60 mL/min signify possible Chronic Kidney Disease.    Anion gap 5 5 - 15  Magnesium     Status: None   Collection Time: 04/30/17  3:50 AM  Result Value Ref Range   Magnesium 1.8 1.7 - 2.4 mg/dL  Phosphorus     Status: None   Collection Time: 04/30/17  3:50 AM  Result Value Ref Range   Phosphorus 3.3 2.5 - 4.6 mg/dL  Glucose, capillary     Status: Abnormal   Collection Time: 04/30/17 12:31 PM  Result Value Ref Range   Glucose-Capillary 100 (H) 65 - 99 mg/dL    Current Facility-Administered Medications  Medication Dose Route Frequency Provider Last Rate Last Dose  . albuterol (PROVENTIL) (2.5 MG/3ML) 0.083% nebulizer solution 2.5 mg  2.5 mg Inhalation  Q4H PRN Gouru, Aruna, MD      . clonazepam (KLONOPIN) disintegrating tablet 1 mg  1 mg Oral BID Shandell Jallow T, MD      . dexmedetomidine (PRECEDEX) 400 MCG/100ML (4 mcg/mL) infusion  0.4-1.2 mcg/kg/hr Intravenous Titrated Dustin Flock, MD   Stopped at 04/29/17 2330  . dextrose 5 %-0.45 % sodium chloride infusion   Intravenous Continuous Dorene Sorrow S, NP 75 mL/hr at 04/30/17 1600    . escitalopram (LEXAPRO) tablet 20 mg  20 mg Oral QHS Gouru, Aruna, MD   20 mg at 04/29/17 2048  . folic acid (FOLVITE) tablet 1 mg  1 mg Oral Daily Dustin Flock, MD   1 mg at 04/29/17 1721  . furosemide (LASIX) tablet 20 mg  20 mg Oral BID Tukov, Magadalene S, NP      . ipratropium-albuterol (DUONEB) 0.5-2.5 (3) MG/3ML nebulizer solution 3 mL  3 mL Nebulization Q6H PRN Gouru, Aruna, MD      . loratadine (CLARITIN) tablet 10 mg  10 mg Oral Daily Gouru, Aruna, MD   10 mg at 04/29/17 0944  . LORazepam (ATIVAN) tablet 1 mg  1 mg Oral Q6H PRN Dustin Flock, MD  Or  . LORazepam (ATIVAN) injection 1 mg  1 mg Intravenous Q6H PRN Dustin Flock, MD   1 mg at 04/29/17 1923  . mometasone-formoterol (DULERA) 100-5 MCG/ACT inhaler 2 puff  2 puff Inhalation BID Nicholes Mango, MD   2 puff at 04/29/17 0943  . morphine 2 MG/ML injection 2 mg  2 mg Intravenous Q6H PRN Gouru, Aruna, MD   2 mg at 04/29/17 0630  . multivitamin with minerals tablet 1 tablet  1 tablet Oral Daily Dustin Flock, MD      . nicotine (NICODERM CQ - dosed in mg/24 hours) patch 14 mg  14 mg Transdermal Daily Gouru, Aruna, MD   14 mg at 04/30/17 1304  . oxyCODONE (Oxy IR/ROXICODONE) immediate release tablet 10 mg  10 mg Oral Q6H PRN Dustin Flock, MD   10 mg at 04/29/17 1237  . pantoprazole (PROTONIX) EC tablet 40 mg  40 mg Oral BID Dustin Flock, MD   40 mg at 04/29/17 2049  . potassium chloride SA (K-DUR,KLOR-CON) CR tablet 40 mEq  40 mEq Oral Daily Gouru, Aruna, MD   40 mEq at 04/29/17 0944  . spironolactone (ALDACTONE) tablet 100 mg   100 mg Oral Daily Gouru, Aruna, MD   100 mg at 04/29/17 0944  . thiamine (VITAMIN B-1) tablet 100 mg  100 mg Oral Daily Dustin Flock, MD   100 mg at 04/29/17 1721   Or  . thiamine (B-1) injection 100 mg  100 mg Intravenous Daily Dustin Flock, MD   100 mg at 04/30/17 1305  . traMADol (ULTRAM) tablet 50 mg  50 mg Oral Q6H PRN Gouru, Aruna, MD   50 mg at 04/29/17 0142    Musculoskeletal: Strength & Muscle Tone: decreased Gait & Station: unable to stand Patient leans: N/A  Psychiatric Specialty Exam: Physical Exam  Nursing note and vitals reviewed. Constitutional: She appears well-developed and well-nourished.  HENT:  Head: Normocephalic and atraumatic.  Eyes: Pupils are equal, round, and reactive to light. Conjunctivae are normal.  Neck: Normal range of motion.  Cardiovascular: Normal heart sounds.   Respiratory: Effort normal.  GI: Soft. She exhibits distension.  Musculoskeletal: Normal range of motion.  Neurological: She is alert.  Skin: Skin is warm and dry.     Psychiatric: Her affect is blunt. She is noncommunicative.    Review of Systems  Unable to perform ROS: Patient unresponsive    Blood pressure 110/78, pulse 86, temperature 98.3 F (36.8 C), temperature source Oral, resp. rate 20, height _0  (1.626 m), weight 76.6 kg (168 lb 12.8 oz), SpO2 95 %.Body mass index is 28.97 kg/m.  General Appearance: Casual  Eye Contact:  None  Speech:  Negative  Volume:  Decreased  Mood:  Negative  Affect:  Negative  Thought Process:  NA  Orientation:  Negative  Thought Content:  Negative  Suicidal Thoughts:  No  Homicidal Thoughts:  No  Memory:  Negative  Judgement:  Negative  Insight:  Negative  Psychomotor Activity:  Negative  Concentration:  Concentration: Negative  Recall:  Negative  Fund of Knowledge:  Negative  Language:  Negative  Akathisia:  Negative  Handed:  Right  AIMS (if indicated):     Assets:  Social Support  ADL's:  Impaired  Cognition:   Impaired,  Severe  Sleep:        Treatment Plan Summary: Daily contact with patient to assess and evaluate symptoms and progress in treatment, Medication management and Plan The description of her symptoms is highly  consistent with delirium tremens. This would also be consistent with the time course of her recent drinking. When I came to see the patient she had been taken off of the Precedex drip. Seem to be sleeping comfortably. Reports are that she has not been agitated today. If the patient does become agitated and delirious again symptoms can be managed with low doses of Ativan. I see she was Artie continued on her regular psychiatric medicine. I change the clonazepam to the oral dissolving tablet to make it easier to administer if she is still this level of sedated. No other additions to medication. I will follow-up as needed.  Disposition: Patient does not meet criteria for psychiatric inpatient admission.  Alethia Berthold, MD 04/30/2017 5:57 PM

## 2017-05-01 DIAGNOSIS — R945 Abnormal results of liver function studies: Secondary | ICD-10-CM

## 2017-05-01 LAB — GLUCOSE, CAPILLARY
Glucose-Capillary: 99 mg/dL (ref 65–99)
Glucose-Capillary: 130 mg/dL — ABNORMAL HIGH (ref 65–99)

## 2017-05-01 MED ORDER — HYDROXYZINE HCL 25 MG PO TABS
25.0000 mg | ORAL_TABLET | Freq: Three times a day (TID) | ORAL | Status: DC | PRN
Start: 2017-05-01 — End: 2017-05-04
  Administered 2017-05-01 – 2017-05-04 (×5): 25 mg via ORAL
  Filled 2017-05-01 (×6): qty 1

## 2017-05-01 MED ORDER — MORPHINE SULFATE (PF) 2 MG/ML IV SOLN: 1.0000 mg | Freq: Four times a day (QID) | INTRAVENOUS | Status: DC | PRN

## 2017-05-01 MED ORDER — OXYCODONE HCL 5 MG PO TABS
10.0000 mg | ORAL_TABLET | Freq: Four times a day (QID) | ORAL | Status: DC | PRN
  Administered 2017-05-01 – 2017-05-02 (×3): 10 mg via ORAL
  Filled 2017-05-01 (×3): qty 2

## 2017-05-01 NOTE — Progress Notes (Signed)
Patient transferred from ICU via wheelchair to room 1C-104. Oriented patient to room and room equipment. Verified placement of telemetry leads and telemetry box 40-37. Patient in bed and currently complains of no pain or discomfort. Will continue to monitor patient to end of shift.

## 2017-05-01 NOTE — Progress Notes (Signed)
Assigned to pt from 1540-1900. Pt A&Ox4. Calm and cooperative. IVC and sitter d/c. CIWA score 0. Oxycodone given for abdominal pain with improvement.

## 2017-05-01 NOTE — Progress Notes (Signed)
[employer] [facility] [place] at [facility]                                                                                                                                                                                  Patient Demographics   Susan [name], is a 46 y.o. female, DOB - [birth year], WGN:[id number]  Admit date - 04/28/2017   Admitting Physician Ramonita Lab, MD  Outpatient Primary MD for the patient is [name], MD   LOS - 3  Subjective: Patient currently drowsy and does not answer my questions   Review of Systems:   CONSTITUTIONAL: Drowsy  Vitals:   Vitals:   05/01/17 0400 05/01/17 0500 05/01/17 0612 05/01/17 0613  BP: 107/73 105/64  117/76  Pulse: 73 68  65  Resp: (!) 24 (!) 22  16  Temp: 98.8 F (37.1 C)   97.6 F (36.4 C)  TempSrc: Oral   Oral  SpO2: 95% 96%  99%  Weight:   163 lb 14.4 oz (74.3 kg)   Height:    (1.6 m)     Wt Readings from Last 3 Encounters:  05/01/17 163 lb 14.4 oz (74.3 kg)  03/06/17 175 lb (79.4 kg)  02/27/17 175 lb (79.4 kg)     Intake/Output Summary (Last 24 hours) at 05/01/17 1055 Last data filed at 05/01/17 0707  Gross per 24 hour  Intake          1733.75 ml  Output              400 ml  Net          1333.75 ml    Physical Exam:   GENERAL: Confused HEAD, EYES, EARS, NOSE AND THROAT: Atraumatic, normocephalic. Extraocular muscles are intact. Pupils equal and reactive to light. Sclerae anicteric. No conjunctival injection. No oro-pharyngeal erythema.  NECK: Supple. There is no jugular venous distention. No bruits, no lymphadenopathy, no thyromegaly.  HEART: Regular rate and rhythm,. No murmurs, no rubs, no clicks.  LUNGS: Clear to auscultation bilaterally. No rales or rhonchi. No wheezes.  ABDOMEN: Soft, flat, mild epigastric tenderness, nondistended. Has good bowel sounds. No hepatosplenomegaly appreciated.  EXTREMITIES: No evidence of any cyanosis, clubbing, or peripheral edema.  +2 pedal and radial  pulses bilaterally.  NEUROLOGIC: Drowsy  SKIN: Moist and warm with no rashes appreciated.  Psych: Drowsy LN: No inguinal LN enlargement    Antibiotics   Anti-infectives    None      Medications   Scheduled Meds: . clonazePAM  1 mg Oral BID  . escitalopram  20 mg Oral QHS  . folic acid  1 mg Oral Daily  . furosemide  20 mg Oral BID  . loratadine  10 mg Oral Daily  . mometasone-formoterol  2 puff Inhalation BID  . multivitamin with minerals  1 tablet Oral Daily  . nicotine  14 mg Transdermal Daily  . pantoprazole  40 mg Oral BID  . potassium chloride SA  40 mEq Oral Daily  . spironolactone  100 mg Oral Daily  . thiamine  100 mg Oral Daily   Or  . thiamine  100 mg Intravenous Daily   Continuous Infusions: . dexmedetomidine (PRECEDEX) IV infusion Stopped (04/29/17 2330)   PRN Meds:.albuterol, ipratropium-albuterol, LORazepam **OR** LORazepam, oxyCODONE, traMADol   Data Review:   Micro Results Recent Results (from the past 240 hour(s))  MRSA PCR Screening     Status: None   Collection Time: 04/29/17  9:09 PM  Result Value Ref Range Status   MRSA by PCR NEGATIVE NEGATIVE Final    Comment:        The GeneXpert MRSA Assay (FDA approved for NASAL specimens only), is one component of a comprehensive MRSA colonization surveillance program. It is not intended to diagnose MRSA infection nor to guide or monitor treatment for MRSA infections.     Radiology Reports Ct Head Wo Contrast  Result Date: 04/29/2017 CLINICAL DATA:  Combative, acute psychosis.  Alcohol withdrawal. EXAM: CT HEAD WITHOUT CONTRAST TECHNIQUE: Contiguous axial images were obtained from the base of the skull through the vertex without intravenous contrast. COMPARISON:  CT HEAD February 27, 2017 FINDINGS: BRAIN: No intraparenchymal hemorrhage, mass effect nor midline shift. The ventricles and sulci are normal. No acute large vascular territory infarcts. No abnormal extra-axial fluid collections. Basal  cisterns are patent. VASCULAR: Trace calcific atherosclerosis. SKULL/SOFT TISSUES: No skull fracture. No significant soft tissue swelling. Skin staple LEFT parietal scalp. ORBITS/SINUSES: The included ocular globes and orbital contents are normal.The mastoid aircells and included paranasal sinuses are well-aerated. OTHER: None. IMPRESSION: 1. Negative noncontrast CT HEAD. 2. Residual staple LEFT parietal scalp. Electronically Signed   By: Awilda Metro M.D.   On: 04/29/2017 23:04   Mr 3d Recon At Scanner  Result Date: 05/01/2017 CLINICAL DATA:  Cirrhosis cirrhosis. Fibrosis risk identified on ultrasound EXAM: MRI ABDOMEN WITHOUT AND WITH CONTRAST (INCLUDING MRCP) TECHNIQUE: Multiplanar multisequence MR imaging of the abdomen was performed both before and after the administration of intravenous contrast. Heavily T2-weighted images of the biliary and pancreatic ducts were obtained, and three-dimensional MRCP images were rendered by post processing. CONTRAST:  15mL MULTIHANCE GADOBENATE DIMEGLUMINE 529 MG/ML IV SOLN COMPARISON:  Ultrasound 11/17/2015 FINDINGS: Lower chest:  Lung bases are clear. Hepatobiliary: The present nodular contour. Minimal linear high T2 signal often seen with fibrosis of cirrhosis. Gallbladder normal. No intrahepatic duct dilatation. The common bile duct is upper limits of normal in 6-7 mm no obstructing stone or lesion identified. Postcontrast imaging demonstrates no enhancing hepatic lesion. There is some linear delayed enhancement suggesting a or architectural distortion./fibrosis Pancreas: No pancreatic duct dilatation or inflammation. Spleen: Normal spleen. Adrenals/urinary tract: Adrenal glands and kidneys are normal. Stomach/Bowel: Stomach and limited of the small bowel is unremarkable Vascular/Lymphatic: Abdominal aortic normal caliber. No retroperitoneal periportal lymphadenopathy. Musculoskeletal: No aggressive osseous lesion IMPRESSION: 1. Morphologic changes in liver  consistent cirrhosis with mild fibrosis. 2. No evidence hepatoma. 3. No ascites.  Spleen normal volume. 4. No intrahepatic duct dilatation. Common bile duct upper limits normal without obstructing lesion. Electronically Signed   By: Genevive Bi M.D.   On: 05/01/2017 08:10   Mr Abdomen Mrcp W Wo Contast  Result Date: 05/01/2017 CLINICAL DATA:  Cirrhosis cirrhosis. Fibrosis risk identified on ultrasound EXAM: MRI ABDOMEN WITHOUT AND WITH CONTRAST (INCLUDING MRCP) TECHNIQUE: Multiplanar multisequence MR imaging of the abdomen was performed both before and after the administration of intravenous contrast. Heavily T2-weighted images of the biliary and pancreatic ducts were obtained, and three-dimensional MRCP images were rendered by post processing. CONTRAST:  15mL MULTIHANCE GADOBENATE DIMEGLUMINE 529 MG/ML IV SOLN COMPARISON:  Ultrasound 11/17/2015 FINDINGS: Lower chest:  Lung bases are clear. Hepatobiliary: The present nodular contour. Minimal linear high T2 signal often seen with fibrosis of cirrhosis. Gallbladder normal. No intrahepatic duct dilatation. The common bile duct is upper limits of normal in 6-7 mm no obstructing stone or lesion identified. Postcontrast imaging demonstrates no enhancing hepatic lesion. There is some linear delayed enhancement suggesting a or architectural distortion./fibrosis Pancreas: No pancreatic duct dilatation or inflammation. Spleen: Normal spleen. Adrenals/urinary tract: Adrenal glands and kidneys are normal. Stomach/Bowel: Stomach and limited of the small bowel is unremarkable Vascular/Lymphatic: Abdominal aortic normal caliber. No retroperitoneal periportal lymphadenopathy. Musculoskeletal: No aggressive osseous lesion IMPRESSION: 1. Morphologic changes in liver consistent cirrhosis with mild fibrosis. 2. No evidence hepatoma. 3. No ascites.  Spleen normal volume. 4. No intrahepatic duct dilatation. Common bile duct upper limits normal without obstructing lesion.  Electronically Signed   By: Genevive Bi M.D.   On: 05/01/2017 08:10   US Abdomen Limited Ruq  Result Date: 04/28/2017 CLINICAL DATA:  Abdominal pain with nausea for 5 days EXAM: ULTRASOUND ABDOMEN LIMITED RIGHT UPPER QUADRANT COMPARISON:  11/17/2015 FINDINGS: Gallbladder: No gallstones or wall thickening visualized. No sonographic Murphy sign noted by sonographer. Common bile duct: Diameter: Maximum diameter of 8.5 mm. Liver: No focal lesion identified. Nodular liver contour, suspicious for cirrhosis. Portal vein is patent on color Doppler imaging with normal direction of blood flow towards the liver. IMPRESSION: 1. Negative for acute cholecystitis 2. Prominent extrahepatic bile duct up to 8.5 mm, suggest correlation with laboratory values 3. Nodular liver contour suspicious for cirrhosis. Electronically Signed   By: Jasmine Pang M.D.   On: 04/28/2017 18:08     CBC  Recent Labs Lab 04/28/17 1200 04/29/17 0359  WBC 6.8 5.0  HGB 15.4 13.5  HCT 43.6 37.8  PLT 71* 62*  MCV 101.7* 102.0*  MCH 35.9* 36.5*  MCHC 35.3 35.8  RDW 12.8 12.7    Chemistries   Recent Labs Lab 04/28/17 1200 04/29/17 0359 04/30/17 0350  NA 130* 131* 132*  K 3.5 3.6 3.7  CL 98* 103 105  CO2 20* 20* 22  GLUCOSE 113* 95 100*  BUN CREATININE 0.42* 0.60 <0.30*  CALCIUM 9.3 9.1 8.9  MG  --   --  1.8  AST 248* 186* 293*  ALT 137* 112* 155*  ALKPHOS 117 101 85  BILITOT 4.2* 3.0* 2.5*   ------------------------------------------------------------------------------------------------------------------ CrCl cannot be calculated (This lab value cannot be used to calculate CrCl because it is not a number: <0.30). ------------------------------------------------------------------------------------------------------------------  Recent Labs  04/29/17 0359  HGBA1C 4.8   ------------------------------------------------------------------------------------------------------------------ No results  for input(s): CHOL, HDL, LDLCALC, TRIG, CHOLHDL, LDLDIRECT in the last 72 hours. ------------------------------------------------------------------------------------------------------------------ No results for input(s): TSH, T4TOTAL, T3FREE, THYROIDAB in the last 72 hours.  Invalid input(s): FREET3 ------------------------------------------------------------------------------------------------------------------ No results for input(s): VITAMINB12, FOLATE, FERRITIN, TIBC, IRON, RETICCTPCT in the last 72 hours.  Coagulation profile No results for input(s): INR, PROTIME in the last 168 hours.  No results for input(s): DDIMER in the last 72 hours.  Cardiac Enzymes  No results for input(s): CKMB, TROPONINI, MYOGLOBIN in the last 168 hours.  Invalid input(s): CK ------------------------------------------------------------------------------------------------------------------ Invalid input(s): POCBNP    Assessment & Plan  Patient is a 46 year old with abdominal pain  #: alcohol withdrawal  Continue CIWA protocol   #acute abdominal pain: No cause of the pain identified With dilated common bile duct GI following MRCP shows no stone or obstruction Stop IV morphine  # obstructive jaundice with prominent extrahepatic bile duct up to 8.5 mm Repeat LFTs in the morning   #chronic history of hepatitis C and liver cirrhosis Continue home medication Aldactone  Lasix and potassium supplements on hold  #GERD PPI  #Thrombocytopenia secondary to liver cirrhosis No active bleeding or bruising. Monitor platelet count closely  Tobacco abuse disorder Admitting physician counsel patient regarding stop smoking     Code Status Orders        Start     Ordered   04/28/17 2015  Full code  Continuous     04/28/17 2014    Code Status History    Date Active Date Inactive Code Status Order ID Comments User Context   04/02/2015  2:10 PM 04/07/2015  5:39 PM Full Code [id number]  Adrian Saran, MD Inpatient   12/09/2014  5:13 PM 12/12/2014  5:06 PM Full Code [id number]  Altamese Dilling, MD Inpatient           Consults  gastroenterology  DVT Prophylaxis  SCDs due to thrombocytopenia  Lab Results  Component Value Date   PLT 62 (L) 04/29/2017     Time Spent in minutes   32 minutes  Greater than 50% of time spent in care coordination and counseling patient regarding the condition and plan of care.   Auburn Bilberry M.D on 05/01/2017 at 10:55 AM  Between 7am to 6pm - Pager - [phone]  After 6pm go to [email or web address] - password [email or web address] [facility]  [facility]   Office  [phone]

## 2017-05-01 NOTE — Progress Notes (Signed)
Wyline Mood MD, MRCP(U.K) [address]  [address]  [place], [place] [address]  Main: [phone]    Susan [name] is being followed for abnormal LFT's   Subjective: No complaints    Objective: Vital signs in last 24 hours: Vitals:   05/01/17 0400 05/01/17 0500 05/01/17 0612 05/01/17 0613  BP: 107/73 105/64  117/76  Pulse: 73 68  65  Resp: (!) 24 (!) 22  16  Temp: 98.8 F (37.1 C)   97.6 F (36.4 C)  TempSrc: Oral   Oral  SpO2: 95% 96%  99%  Weight:   163 lb 14.4 oz (74.3 kg)   Height:    (1.6 m)    Weight change:   Intake/Output Summary (Last 24 hours) at 05/01/17 0347 Last data filed at 05/01/17 4259  Gross per 24 hour  Intake          1733.75 ml  Output              400 ml  Net          1333.75 ml     Exam: Heart:: Regular rate and rhythm, S1S2 present or without murmur or extra heart sounds Lungs: normal, clear to auscultation and clear to auscultation and percussion Abdomen: soft, nontender, normal bowel sounds   Lab Results: @ Micro Results: Recent Results (from the past 240 hour(s))  MRSA PCR Screening     Status: None   Collection Time: 04/29/17  9:09 PM  Result Value Ref Range Status   MRSA by PCR NEGATIVE NEGATIVE Final    Comment:        The GeneXpert MRSA Assay (FDA approved for NASAL specimens only), is one component of a comprehensive MRSA colonization surveillance program. It is not intended to diagnose MRSA infection nor to guide or monitor treatment for MRSA infections.    Studies/Results: Ct Head Wo Contrast  Result Date: 04/29/2017 CLINICAL DATA:  Combative, acute psychosis.  Alcohol withdrawal. EXAM: CT HEAD WITHOUT CONTRAST TECHNIQUE: Contiguous axial images were obtained from the base of the skull through the vertex without intravenous contrast. COMPARISON:  CT HEAD February 27, 2017 FINDINGS: BRAIN: No intraparenchymal hemorrhage, mass effect nor midline shift. The ventricles and sulci are normal. No acute  large vascular territory infarcts. No abnormal extra-axial fluid collections. Basal cisterns are patent. VASCULAR: Trace calcific atherosclerosis. SKULL/SOFT TISSUES: No skull fracture. No significant soft tissue swelling. Skin staple LEFT parietal scalp. ORBITS/SINUSES: The included ocular globes and orbital contents are normal.The mastoid aircells and included paranasal sinuses are well-aerated. OTHER: None. IMPRESSION: 1. Negative noncontrast CT HEAD. 2. Residual staple LEFT parietal scalp. Electronically Signed   By: Awilda Metro M.D.   On: 04/29/2017 23:04   Mr 3d Recon At Scanner  Result Date: 05/01/2017 CLINICAL DATA:  Cirrhosis cirrhosis. Fibrosis risk identified on ultrasound EXAM: MRI ABDOMEN WITHOUT AND WITH CONTRAST (INCLUDING MRCP) TECHNIQUE: Multiplanar multisequence MR imaging of the abdomen was performed both before and after the administration of intravenous contrast. Heavily T2-weighted images of the biliary and pancreatic ducts were obtained, and three-dimensional MRCP images were rendered by post processing. CONTRAST:  15mL MULTIHANCE GADOBENATE DIMEGLUMINE 529 MG/ML IV SOLN COMPARISON:  Ultrasound 11/17/2015 FINDINGS: Lower chest:  Lung bases are clear. Hepatobiliary: The present nodular contour. Minimal linear high T2 signal often seen with fibrosis of cirrhosis. Gallbladder normal. No intrahepatic duct dilatation. The common bile duct is upper limits of normal in 6-7 mm no obstructing stone or lesion identified. Postcontrast imaging  demonstrates no enhancing hepatic lesion. There is some linear delayed enhancement suggesting a or architectural distortion./fibrosis Pancreas: No pancreatic duct dilatation or inflammation. Spleen: Normal spleen. Adrenals/urinary tract: Adrenal glands and kidneys are normal. Stomach/Bowel: Stomach and limited of the small bowel is unremarkable Vascular/Lymphatic: Abdominal aortic normal caliber. No retroperitoneal periportal lymphadenopathy.  Musculoskeletal: No aggressive osseous lesion IMPRESSION: 1. Morphologic changes in liver consistent cirrhosis with mild fibrosis. 2. No evidence hepatoma. 3. No ascites.  Spleen normal volume. 4. No intrahepatic duct dilatation. Common bile duct upper limits normal without obstructing lesion. Electronically Signed   By: Genevive Bi M.D.   On: 05/01/2017 08:10   Mr Abdomen Mrcp Vivien Rossetti Contast  Result Date: 05/01/2017 CLINICAL DATA:  Cirrhosis cirrhosis. Fibrosis risk identified on ultrasound EXAM: MRI ABDOMEN WITHOUT AND WITH CONTRAST (INCLUDING MRCP) TECHNIQUE: Multiplanar multisequence MR imaging of the abdomen was performed both before and after the administration of intravenous contrast. Heavily T2-weighted images of the biliary and pancreatic ducts were obtained, and three-dimensional MRCP images were rendered by post processing. CONTRAST:  15mL MULTIHANCE GADOBENATE DIMEGLUMINE 529 MG/ML IV SOLN COMPARISON:  Ultrasound 11/17/2015 FINDINGS: Lower chest:  Lung bases are clear. Hepatobiliary: The present nodular contour. Minimal linear high T2 signal often seen with fibrosis of cirrhosis. Gallbladder normal. No intrahepatic duct dilatation. The common bile duct is upper limits of normal in 6-7 mm no obstructing stone or lesion identified. Postcontrast imaging demonstrates no enhancing hepatic lesion. There is some linear delayed enhancement suggesting a or architectural distortion./fibrosis Pancreas: No pancreatic duct dilatation or inflammation. Spleen: Normal spleen. Adrenals/urinary tract: Adrenal glands and kidneys are normal. Stomach/Bowel: Stomach and limited of the small bowel is unremarkable Vascular/Lymphatic: Abdominal aortic normal caliber. No retroperitoneal periportal lymphadenopathy. Musculoskeletal: No aggressive osseous lesion IMPRESSION: 1. Morphologic changes in liver consistent cirrhosis with mild fibrosis. 2. No evidence hepatoma. 3. No ascites.  Spleen normal volume. 4. No intrahepatic  duct dilatation. Common bile duct upper limits normal without obstructing lesion. Electronically Signed   By: Genevive Bi M.D.   On: 05/01/2017 08:10   Medications: I have reviewed the patient's current medications. Scheduled Meds: . clonazePAM  1 mg Oral BID  . escitalopram  20 mg Oral QHS  . folic acid  1 mg Oral Daily  . furosemide  20 mg Oral BID  . loratadine  10 mg Oral Daily  . mometasone-formoterol  2 puff Inhalation BID  . multivitamin with minerals  1 tablet Oral Daily  . nicotine  14 mg Transdermal Daily  . pantoprazole  40 mg Oral BID  . potassium chloride SA  40 mEq Oral Daily  . spironolactone  100 mg Oral Daily  . thiamine  100 mg Oral Daily   Or  . thiamine  100 mg Intravenous Daily   Continuous Infusions: . dexmedetomidine (PRECEDEX) IV infusion Stopped (04/29/17 2330)  . dextrose 5 % and 0.45% NaCl 75 mL/hr at 05/01/17 0707   PRN Meds:.albuterol, ipratropium-albuterol, LORazepam **OR** LORazepam, morphine injection, oxyCODONE, traMADol   Assessment: Principal Problem:   Delirium tremens (HCC) Active Problems:   Acute abdominal pain   Biliary obstruction   Alcohol withdrawal syndrome, with delirium (HCC)   Susan [name] a 45 y.o.y/o femalewith liver cirrhosis from possible alcohol/hepatitis C . [facility]s with [facility] , admitted with RUQ pain , elevated transaminases, and T bilirubin of 4 , dilated CBD on RUQ USG 8.5 mm. No gall stones seen on CT scan Likely abnormal LFT's due to alcohol. No stones seen on CBD.  May have SOD type 1 .   Plan: 1. Treat her withdrawal 2. F/u Acute hepatitis Panel  3. Stop all alcohol 4. Outpatient GI follow up to evaluate for SOD type 1 if her history suggests the same but will need to be off all alcohol to help with diagnosis.    LOS: 3 days   Wyline Mood 05/01/2017, 9:48 AM

## 2017-05-01 NOTE — Consult Note (Signed)
[place] [facility] Consult   Reason for Consult:  Follow-up consult for 46 year old woman with liver failure who was confused and agitated in the intensive care unit Referring Physician:  Posey Pronto Patient Identification: Susan [name] MRN:  [id number] Principal Diagnosis: Delirium tremens [facility]) Diagnosis:   Patient Active Problem List   Diagnosis Date Noted  . Biliary obstruction [K83.1]   . Delirium tremens ([place]) [F10.231]   . Alcohol withdrawal syndrome, with delirium ([place]) [F10.231]   . Acute abdominal pain [R10.9] 04/28/2017  . Elevated LFTs [R79.89] 04/02/2015  . 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 Diarrhea [R19.7] 12/09/2014  . Blood in stool [K92.1] 12/09/2014  . Abdominal pain [R10.9] 12/09/2014  . COPD (chronic obstructive pulmonary disease) ([place]) [J44.9] 12/09/2014    Total Time spent with patient: 30 minutes  Subjective:   Susan [name] is a 46 y.o. female patient admitted with "I turned orange".  HPI:  Patient seen for follow-up. She has been moved out of the intensive care unit to the regular medical floor. Patient sitting up in bed awake alert and interactive when I saw her today. She says that she vaguely remembers coming to the hospital because she was jaundiced. Patient is a very poor historian answering most questions with only 1 word. Does not want to elaborate much. She is currently denying feeling depressed or confused. Denies any hallucinations. Doesn't feel jittery doesn't feel sick to her stomach. Patient tries to minimize her alcohol use saying that she doesn't drink hardly any these days although she admits that she will sometimes drink as much as a sixpack. Doesn't exactly remember how long ago the last drink was.  Social history: Lives in the community with some family around. Not able to work.  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 COPD.  Substance abuse history: Long-standing alcohol  abuse  Past Psychiatric History: Only previous mental health evaluation was for alcohol abuse. No history of suicide attempts violence no history of psychosis.  Risk to Self: Is patient at risk for suicide?: No Risk to Others:   Prior Inpatient Therapy:   Prior Outpatient Therapy:    Past Medical History:  Past Medical History:  Diagnosis Date  . Allergy   . Anemia   . Anxiety   . Asthma   . Cirrhosis ([place])   . COPD (chronic obstructive pulmonary disease) ([place]) 12/09/2014  . Depression   . Emphysema of lung ([place])   . GERD (gastroesophageal reflux disease)   . Heart murmur   . Hepatitis C    pt said they told her she had this last visit here  . Hyperlipidemia   . Hypertension     Past Surgical History:  Procedure Laterality Date  . CESAREAN SECTION  2009   Family History:  Family History  Problem Relation Age of Onset  . Other Mother   . Lung cancer Mother   . Skin cancer Father    Family Psychiatric  History: None Social History:  History  Alcohol Use No    Comment: Quit drinking few years ago.     History  Drug Use No    Social History   Social History  . Marital status: Single    Spouse name: N/A  . Number of children: N/A  . Years of education: N/A   Social History Main Topics  . Smoking status: Current Every Day Smoker    Packs/day: 1.00    Years: 20.00    Types: Cigarettes  .  Smokeless tobacco: Never Used  . Alcohol use No     Comment: Quit drinking few years ago.  . Drug use: No  . Sexual activity: Not Asked   Other Topics Concern  . None   Social History Narrative  . None   Additional Social History:    Allergies:   Allergies  Allergen Reactions  . Doxycycline Hives  . Latex Hives  . Promethazine Diarrhea  . Ranitidine Nausea Only  . Zofran [Ondansetron Hcl] Itching    Labs:  Results for orders placed or performed during the hospital encounter of 04/28/17 (from the past 48 hour(s))  Glucose, capillary     Status: Abnormal    Collection Time: 04/29/17  8:43 PM  Result Value Ref Range   Glucose-Capillary 118 (H) 65 - 99 mg/dL  MRSA PCR Screening     Status: None   Collection Time: 04/29/17  9:09 PM  Result Value Ref Range   MRSA by PCR NEGATIVE NEGATIVE    Comment:        The GeneXpert MRSA Assay (FDA approved for NASAL specimens only), is one component of a comprehensive MRSA colonization surveillance program. It is not intended to diagnose MRSA infection nor to guide or monitor treatment for MRSA infections.   Comprehensive metabolic panel     Status: Abnormal   Collection Time: 04/30/17  3:50 AM  Result Value Ref Range   Sodium 132 (L) 135 - 145 mmol/L   Potassium 3.7 3.5 - 5.1 mmol/L   Chloride 105 101 - 111 mmol/L   CO2 22 22 - 32 mmol/L   Glucose, Bld 100 (H) 65 - 99 mg/dL   BUN 10 6 - 20 mg/dL   Creatinine, Ser <0.30 (L) 0.44 - 1.00 mg/dL   Calcium 8.9 8.9 - 10.3 mg/dL   Total Protein 7.1 6.5 - 8.1 g/dL   Albumin 3.3 (L) 3.5 - 5.0 g/dL   AST 293 (H) 15 - 41 U/L   ALT 155 (H) 14 - 54 U/L   Alkaline Phosphatase 85 38 - 126 U/L   Total Bilirubin 2.5 (H) 0.3 - 1.2 mg/dL   GFR calc non Af Amer NOT CALCULATED >60 mL/min   GFR calc Af Amer NOT CALCULATED >60 mL/min    Comment: (NOTE) The eGFR has been calculated using the CKD EPI equation. This calculation has not been validated in all clinical situations. eGFR's persistently <60 mL/min signify possible Chronic Kidney Disease.    Anion gap 5 5 - 15  Magnesium     Status: None   Collection Time: 04/30/17  3:50 AM  Result Value Ref Range   Magnesium 1.8 1.7 - 2.4 mg/dL  Phosphorus     Status: None   Collection Time: 04/30/17  3:50 AM  Result Value Ref Range   Phosphorus 3.3 2.5 - 4.6 mg/dL  Glucose, capillary     Status: Abnormal   Collection Time: 04/30/17 12:31 PM  Result Value Ref Range   Glucose-Capillary 100 (H) 65 - 99 mg/dL  Glucose, capillary     Status: Abnormal   Collection Time: 04/30/17  6:03 PM  Result Value Ref Range    Glucose-Capillary 107 (H) 65 - 99 mg/dL  Glucose, capillary     Status: None   Collection Time: 04/30/17 11:43 PM  Result Value Ref Range   Glucose-Capillary 98 65 - 99 mg/dL  Glucose, capillary     Status: None   Collection Time: 05/01/17  5:04 AM  Result Value Ref Range  Glucose-Capillary 99 65 - 99 mg/dL  Glucose, capillary     Status: Abnormal   Collection Time: 05/01/17 11:42 AM  Result Value Ref Range   Glucose-Capillary 130 (H) 65 - 99 mg/dL    Current Facility-Administered Medications  Medication Dose Route Frequency Provider Last Rate Last Dose  . albuterol (PROVENTIL) (2.5 MG/3ML) 0.083% nebulizer solution 2.5 mg  2.5 mg Inhalation Q4H PRN Gouru, Aruna, MD      . clonazepam (KLONOPIN) disintegrating tablet 1 mg  1 mg Oral BID Elspeth Blucher, Madie Reno, MD   1 mg at 04/30/17 2335  . dexmedetomidine (PRECEDEX) 400 MCG/100ML (4 mcg/mL) infusion  0.4-1.2 mcg/kg/hr Intravenous Titrated Dustin Flock, MD   Stopped at 04/29/17 2330  . escitalopram (LEXAPRO) tablet 20 mg  20 mg Oral QHS Gouru, Illene Silver, MD   20 mg at 04/30/17 2334  . folic acid (FOLVITE) tablet 1 mg  1 mg Oral Daily Dustin Flock, MD   1 mg at 05/01/17 1033  . furosemide (LASIX) tablet 20 mg  20 mg Oral BID Dorene Sorrow S, NP   20 mg at 05/01/17 1823  . ipratropium-albuterol (DUONEB) 0.5-2.5 (3) MG/3ML nebulizer solution 3 mL  3 mL Nebulization Q6H PRN Gouru, Aruna, MD      . loratadine (CLARITIN) tablet 10 mg  10 mg Oral Daily Gouru, Aruna, MD   10 mg at 05/01/17 1033  . LORazepam (ATIVAN) tablet 1 mg  1 mg Oral Q6H PRN Dustin Flock, MD       Or  . LORazepam (ATIVAN) injection 1 mg  1 mg Intravenous Q6H PRN Dustin Flock, MD   1 mg at 04/30/17 2159  . mometasone-formoterol (DULERA) 100-5 MCG/ACT inhaler 2 puff  2 puff Inhalation BID Nicholes Mango, MD   2 puff at 04/30/17 2039  . multivitamin with minerals tablet 1 tablet  1 tablet Oral Daily Dustin Flock, MD   1 tablet at 05/01/17 1033  . nicotine (NICODERM CQ -  dosed in mg/24 hours) patch 14 mg  14 mg Transdermal Daily Gouru, Aruna, MD   14 mg at 05/01/17 1035  . oxyCODONE (Oxy IR/ROXICODONE) immediate release tablet 10 mg  10 mg Oral Q6H PRN Dustin Flock, MD   10 mg at 05/01/17 1823  . pantoprazole (PROTONIX) EC tablet 40 mg  40 mg Oral BID Dustin Flock, MD   40 mg at 05/01/17 1033  . potassium chloride SA (K-DUR,KLOR-CON) CR tablet 40 mEq  40 mEq Oral Daily Gouru, Aruna, MD   40 mEq at 05/01/17 1033  . spironolactone (ALDACTONE) tablet 100 mg  100 mg Oral Daily Gouru, Aruna, MD   100 mg at 05/01/17 1033  . thiamine (VITAMIN B-1) tablet 100 mg  100 mg Oral Daily Dustin Flock, MD   100 mg at 05/01/17 1033   Or  . thiamine (B-1) injection 100 mg  100 mg Intravenous Daily Dustin Flock, MD   100 mg at 04/30/17 1305  . traMADol (ULTRAM) tablet 50 mg  50 mg Oral Q6H PRN Gouru, Aruna, MD   50 mg at 04/29/17 0142    Musculoskeletal: Strength & Muscle Tone: decreased Gait & Station: normal Patient leans: N/A  Psychiatric Specialty Exam: Physical Exam  Nursing note and vitals reviewed. Constitutional: She appears well-developed and well-nourished.  HENT:  Head: Normocephalic and atraumatic.  Eyes: Pupils are equal, round, and reactive to light. Conjunctivae are normal.  Neck: Normal range of motion.  Cardiovascular: Regular rhythm and normal heart sounds.   Respiratory: Effort normal. No  respiratory distress.  GI: Soft.  Musculoskeletal: Normal range of motion.  Neurological: She is alert.  Skin: Skin is warm and dry.  Psychiatric: Her affect is blunt. Her speech is delayed. She is slowed. Thought content is not paranoid. She expresses no homicidal and no suicidal ideation. She exhibits abnormal recent memory.    Review of Systems  Constitutional: Negative.   HENT: Negative.   Eyes: Negative.   Respiratory: Negative.   Cardiovascular: Negative.   Gastrointestinal: Negative.   Musculoskeletal: Negative.   Skin: Negative.    Neurological: Negative.   Psychiatric/Behavioral: Negative for depression, hallucinations, memory loss, substance abuse and suicidal ideas. The patient is not nervous/anxious and does not have insomnia.     Blood pressure 114/63, pulse 91, temperature 98.3 F (36.8 C), temperature source Oral, resp. rate 16, height 5' 3"  (1.6 m), weight 74.3 kg (163 lb 14.4 oz), SpO2 99 %.Body mass index is 29.03 kg/m.  General Appearance: Casual  Eye Contact:  Minimal  Speech:  Slow  Volume:  Decreased  Mood:  Euthymic  Affect:  Constricted  Thought Process:  Goal Directed  Orientation:  Full (Time, Place, and Person)  Thought Content:  Logical  Suicidal Thoughts:  No  Homicidal Thoughts:  No  Memory:  Immediate;   Fair Recent;   Poor Remote;   Fair  Judgement:  Fair  Insight:  Fair  Psychomotor Activity:  Decreased  Concentration:  Concentration: Fair  Recall:  [employer] of Knowledge:  Fair  Language:  Fair  Akathisia:  No  Handed:  Right  AIMS (if indicated):     Assets:  Housing Social Support  ADL's:  Intact  Cognition:  Impaired,  Mild  Sleep:        Treatment Plan Summary: Plan 46 year old woman with cirrhosis and alcohol abuse. Symptoms suggest delirium tremens in the emergency room which was managed with Precedex. Patient is now awake and lucid and not showing any acute psychiatric symptoms other than chronic cognitive impairment. Patient does not meet commitment criteria. Discontinue IVC. Supportive counseling and review of the dangers of alcohol abuse otherwise she can be discharged to the outpatient community. Stopped the IVC.  Disposition: No evidence of imminent risk to self or others at present.   Patient does not meet criteria for psychiatric inpatient admission.  Alethia Berthold, MD 05/01/2017 7:34 PM

## 2017-05-02 LAB — GLUCOSE, CAPILLARY
GLUCOSE-CAPILLARY: 106 mg/dL — AB (ref 65–99)
GLUCOSE-CAPILLARY: 138 mg/dL — AB (ref 65–99)
GLUCOSE-CAPILLARY: 94 mg/dL (ref 65–99)
Glucose-Capillary: 112 mg/dL — ABNORMAL HIGH (ref 65–99)
Glucose-Capillary: 137 mg/dL — ABNORMAL HIGH (ref 65–99)

## 2017-05-02 LAB — HCV RNA QUANT
HCV QUANT: 80400 [IU]/mL (ref >50)
HCV Quantitative Log: 4.905 log10 IU/mL (ref >1.70)

## 2017-05-02 LAB — HEPATITIS C ANTIBODY: HCV Ab: >11 s/co ratio — ABNORMAL HIGH (ref 0.0–0.9)

## 2017-05-02 LAB — HEPATITIS B CORE ANTIBODY, TOTAL: HEP B C TOTAL AB: NEGATIVE

## 2017-05-02 LAB — HEPATITIS B CORE ANTIBODY, IGM: Hep B C IgM: NEGATIVE

## 2017-05-02 LAB — HEPATITIS A ANTIBODY, TOTAL: HEP A TOTAL AB: NEGATIVE

## 2017-05-02 LAB — HEPATITIS B DNA, ULTRAQUANTITATIVE, PCR
HBV DNA SERPL PCR-ACNC: NOT DETECTED IU/mL
HBV DNA SERPL PCR-LOG IU: UNDETERMINED log10 IU/mL

## 2017-05-02 LAB — HEPATITIS B SURFACE ANTIBODY, QUANTITATIVE

## 2017-05-02 LAB — HEPATITIS A ANTIBODY, IGM: HEP A IGM: NEGATIVE

## 2017-05-02 LAB — HEPATITIS B E ANTIGEN: HEP B E AG: NEGATIVE

## 2017-05-02 LAB — HEPATITIS B SURFACE ANTIGEN: Hepatitis B Surface Ag: NEGATIVE

## 2017-05-02 LAB — HEPATITIS B E ANTIBODY: HEP B E AB: NEGATIVE

## 2017-05-02 MED ORDER — OXYCODONE HCL 5 MG PO TABS
5.0000 mg | ORAL_TABLET | Freq: Four times a day (QID) | ORAL | Status: DC | PRN
  Administered 2017-05-02 – 2017-05-03 (×2): 5 mg via ORAL
  Filled 2017-05-02 (×2): qty 1

## 2017-05-02 NOTE — Progress Notes (Signed)
[employer] [facility] [place] [facility]                                                                                                                                                                                  Patient Demographics   Susan [name], is a 46 y.o. female, DOB - [birth year], ZOX:[id number]  Admit date - 04/28/2017   Admitting Physician Ramonita Lab, MD  Outpatient Primary MD for the patient is [name], MD   LOS - 4  Subjective:patient still drowsy According to nurse more awake compared to yesterday  Review of Systems:   CONSTITUTIONAL: Drowsy  Vitals:   Vitals:   05/01/17 1944 05/02/17 0506 05/02/17 0939 05/02/17 0940  BP: 113/72 119/78 (!) 96/58 (!) 106/57  Pulse: 86 83 76 73  Resp: Temp: 98 F (36.7 C) 98.5 F (36.9 C) 98.5 F (36.9 C)   TempSrc: Oral Oral Oral   SpO2: 98% 96% 98%   Weight:      Height:        Wt Readings from Last 3 Encounters:  05/01/17 163 lb 14.4 oz (74.3 kg)  03/06/17 175 lb (79.4 kg)  02/27/17 175 lb (79.4 kg)     Intake/Output Summary (Last 24 hours) at 05/02/17 1234 Last data filed at 05/02/17 8119  Gross per 24 hour  Intake              480 ml  Output                0 ml  Net              480 ml    Physical Exam:   GENERAL: Confused HEAD, EYES, EARS, NOSE AND THROAT: Atraumatic, normocephalic. Extraocular muscles are intact. Pupils equal and reactive to light. Sclerae anicteric. No conjunctival injection. No oro-pharyngeal erythema.  NECK: Supple. There is no jugular venous distention. No bruits, no lymphadenopathy, no thyromegaly.  HEART: Regular rate and rhythm,. No murmurs, no rubs, no clicks.  LUNGS: Clear to auscultation bilaterally. No rales or rhonchi. No wheezes.  ABDOMEN: Soft, flat, mild epigastric tenderness, nondistended. Has good bowel sounds. No hepatosplenomegaly appreciated.  EXTREMITIES: No evidence of any cyanosis, clubbing, or peripheral edema.  +2 pedal and  radial pulses bilaterally.  NEUROLOGIC: Drowsy  SKIN: Moist and warm with no rashes appreciated.  Psych: Drowsy LN: No inguinal LN enlargement    Antibiotics   Anti-infectives    None      Medications   Scheduled Meds: . clonazePAM  1 mg Oral BID  . escitalopram  20 mg Oral QHS  . folic acid  1 mg Oral  Daily  . furosemide  20 mg Oral BID  . loratadine  10 mg Oral Daily  . mometasone-formoterol  2 puff Inhalation BID  . multivitamin with minerals  1 tablet Oral Daily  . nicotine  14 mg Transdermal Daily  . pantoprazole  40 mg Oral BID  . potassium chloride SA  40 mEq Oral Daily  . spironolactone  100 mg Oral Daily  . thiamine  100 mg Oral Daily   Continuous Infusions: . dexmedetomidine (PRECEDEX) IV infusion Stopped (04/29/17 2330)   PRN Meds:.albuterol, hydrOXYzine, ipratropium-albuterol, LORazepam **OR** LORazepam, oxyCODONE, traMADol   Data Review:   Micro Results Recent Results (from the past 240 hour(s))  MRSA PCR Screening     Status: None   Collection Time: 04/29/17  9:09 PM  Result Value Ref Range Status   MRSA by PCR NEGATIVE NEGATIVE Final    Comment:        The GeneXpert MRSA Assay (FDA approved for NASAL specimens only), is one component of a comprehensive MRSA colonization surveillance program. It is not intended to diagnose MRSA infection nor to guide or monitor treatment for MRSA infections.     Radiology Reports Ct Head Wo Contrast  Result Date: 04/29/2017 CLINICAL DATA:  Combative, acute psychosis.  Alcohol withdrawal. EXAM: CT HEAD WITHOUT CONTRAST TECHNIQUE: Contiguous axial images were obtained from the base of the skull through the vertex without intravenous contrast. COMPARISON:  CT HEAD February 27, 2017 FINDINGS: BRAIN: No intraparenchymal hemorrhage, mass effect nor midline shift. The ventricles and sulci are normal. No acute large vascular territory infarcts. No abnormal extra-axial fluid collections. Basal cisterns are patent.  VASCULAR: Trace calcific atherosclerosis. SKULL/SOFT TISSUES: No skull fracture. No significant soft tissue swelling. Skin staple LEFT parietal scalp. ORBITS/SINUSES: The included ocular globes and orbital contents are normal.The mastoid aircells and included paranasal sinuses are well-aerated. OTHER: None. IMPRESSION: 1. Negative noncontrast CT HEAD. 2. Residual staple LEFT parietal scalp. Electronically Signed   By: Awilda Metro M.D.   On: 04/29/2017 23:04   Mr 3d Recon At Scanner  Result Date: 05/01/2017 CLINICAL DATA:  Cirrhosis cirrhosis. Fibrosis risk identified on ultrasound EXAM: MRI ABDOMEN WITHOUT AND WITH CONTRAST (INCLUDING MRCP) TECHNIQUE: Multiplanar multisequence MR imaging of the abdomen was performed both before and after the administration of intravenous contrast. Heavily T2-weighted images of the biliary and pancreatic ducts were obtained, and three-dimensional MRCP images were rendered by post processing. CONTRAST:  15mL MULTIHANCE GADOBENATE DIMEGLUMINE 529 MG/ML IV SOLN COMPARISON:  Ultrasound 11/17/2015 FINDINGS: Lower chest:  Lung bases are clear. Hepatobiliary: The present nodular contour. Minimal linear high T2 signal often seen with fibrosis of cirrhosis. Gallbladder normal. No intrahepatic duct dilatation. The common bile duct is upper limits of normal in 6-7 mm no obstructing stone or lesion identified. Postcontrast imaging demonstrates no enhancing hepatic lesion. There is some linear delayed enhancement suggesting a or architectural distortion./fibrosis Pancreas: No pancreatic duct dilatation or inflammation. Spleen: Normal spleen. Adrenals/urinary tract: Adrenal glands and kidneys are normal. Stomach/Bowel: Stomach and limited of the small bowel is unremarkable Vascular/Lymphatic: Abdominal aortic normal caliber. No retroperitoneal periportal lymphadenopathy. Musculoskeletal: No aggressive osseous lesion IMPRESSION: 1. Morphologic changes in liver consistent cirrhosis with  mild fibrosis. 2. No evidence hepatoma. 3. No ascites.  Spleen normal volume. 4. No intrahepatic duct dilatation. Common bile duct upper limits normal without obstructing lesion. Electronically Signed   By: Genevive Bi M.D.   On: 05/01/2017 08:10   Mr Abdomen Mrcp Vivien Rossetti Contast  Result Date: 05/01/2017 CLINICAL  DATA:  Cirrhosis cirrhosis. Fibrosis risk identified on ultrasound EXAM: MRI ABDOMEN WITHOUT AND WITH CONTRAST (INCLUDING MRCP) TECHNIQUE: Multiplanar multisequence MR imaging of the abdomen was performed both before and after the administration of intravenous contrast. Heavily T2-weighted images of the biliary and pancreatic ducts were obtained, and three-dimensional MRCP images were rendered by post processing. CONTRAST:  15mL MULTIHANCE GADOBENATE DIMEGLUMINE 529 MG/ML IV SOLN COMPARISON:  Ultrasound 11/17/2015 FINDINGS: Lower chest:  Lung bases are clear. Hepatobiliary: The present nodular contour. Minimal linear high T2 signal often seen with fibrosis of cirrhosis. Gallbladder normal. No intrahepatic duct dilatation. The common bile duct is upper limits of normal in 6-7 mm no obstructing stone or lesion identified. Postcontrast imaging demonstrates no enhancing hepatic lesion. There is some linear delayed enhancement suggesting a or architectural distortion./fibrosis Pancreas: No pancreatic duct dilatation or inflammation. Spleen: Normal spleen. Adrenals/urinary tract: Adrenal glands and kidneys are normal. Stomach/Bowel: Stomach and limited of the small bowel is unremarkable Vascular/Lymphatic: Abdominal aortic normal caliber. No retroperitoneal periportal lymphadenopathy. Musculoskeletal: No aggressive osseous lesion IMPRESSION: 1. Morphologic changes in liver consistent cirrhosis with mild fibrosis. 2. No evidence hepatoma. 3. No ascites.  Spleen normal volume. 4. No intrahepatic duct dilatation. Common bile duct upper limits normal without obstructing lesion. Electronically Signed   By:  Genevive Bi M.D.   On: 05/01/2017 08:10   US Abdomen Limited Ruq  Result Date: 04/28/2017 CLINICAL DATA:  Abdominal pain with nausea for 5 days EXAM: ULTRASOUND ABDOMEN LIMITED RIGHT UPPER QUADRANT COMPARISON:  11/17/2015 FINDINGS: Gallbladder: No gallstones or wall thickening visualized. No sonographic Murphy sign noted by sonographer. Common bile duct: Diameter: Maximum diameter of 8.5 mm. Liver: No focal lesion identified. Nodular liver contour, suspicious for cirrhosis. Portal vein is patent on color Doppler imaging with normal direction of blood flow towards the liver. IMPRESSION: 1. Negative for acute cholecystitis 2. Prominent extrahepatic bile duct up to 8.5 mm, suggest correlation with laboratory values 3. Nodular liver contour suspicious for cirrhosis. Electronically Signed   By: Jasmine Pang M.D.   On: 04/28/2017 18:08     CBC  Recent Labs Lab 04/28/17 1200 04/29/17 0359  WBC 6.8 5.0  HGB 15.4 13.5  HCT 43.6 37.8  PLT 71* 62*  MCV 101.7* 102.0*  MCH 35.9* 36.5*  MCHC 35.3 35.8  RDW 12.8 12.7    Chemistries   Recent Labs Lab 04/28/17 1200 04/29/17 0359 04/30/17 0350  NA 130* 131* 132*  K 3.5 3.6 3.7  CL 98* 103 105  CO2 20* 20* 22  GLUCOSE 113* 95 100*  BUN CREATININE 0.42* 0.60 <0.30*  CALCIUM 9.3 9.1 8.9  MG  --   --  1.8  AST 248* 186* 293*  ALT 137* 112* 155*  ALKPHOS 117 101 85  BILITOT 4.2* 3.0* 2.5*   ------------------------------------------------------------------------------------------------------------------ CrCl cannot be calculated (This lab value cannot be used to calculate CrCl because it is not a number: <0.30). ------------------------------------------------------------------------------------------------------------------ No results for input(s): HGBA1C in the last 72 hours. ------------------------------------------------------------------------------------------------------------------ No results for input(s): CHOL,  HDL, LDLCALC, TRIG, CHOLHDL, LDLDIRECT in the last 72 hours. ------------------------------------------------------------------------------------------------------------------ No results for input(s): TSH, T4TOTAL, T3FREE, THYROIDAB in the last 72 hours.  Invalid input(s): FREET3 ------------------------------------------------------------------------------------------------------------------ No results for input(s): VITAMINB12, FOLATE, FERRITIN, TIBC, IRON, RETICCTPCT in the last 72 hours.  Coagulation profile No results for input(s): INR, PROTIME in the last 168 hours.  No results for input(s): DDIMER in the last 72 hours.  Cardiac Enzymes No results for input(s): CKMB,  TROPONINI, MYOGLOBIN in the last 168 hours.  Invalid input(s): CK ------------------------------------------------------------------------------------------------------------------ Invalid input(s): POCBNP    Assessment & Plan  Patient is a 46 year old with abdominal pain  #: alcohol withdrawal  Continue CIWA protocol   #acute abdominal pain: No cause of the pain identified With dilated common bile duct GI following MRCP shows no stone or obstruction Continue to monitor  # obstructive jaundice with prominent extrahepatic bile duct up to 8.5 mm   #chronic history of hepatitis C and liver cirrhosis Continue home medication Aldactone  Lasix and potassium supplements on hold  #GERD PPI  #Thrombocytopenia secondary to liver cirrhosis No active bleeding or bruising. Monitor platelet count closely  Tobacco abuse disorder counsel patient regarding stop smoking     Code Status Orders        Start     Ordered   04/28/17 2015  Full code  Continuous     04/28/17 2014    Code Status History    Date Active Date Inactive Code Status Order ID Comments User Context   04/02/2015  2:10 PM 04/07/2015  5:39 PM Full Code [id number]  Adrian Saran, MD Inpatient   12/09/2014  5:13 PM 12/12/2014  5:06 PM Full Code  [id number]  Altamese Dilling, MD Inpatient           Consults  gastroenterology  DVT Prophylaxis  SCDs due to thrombocytopenia  Lab Results  Component Value Date   PLT 62 (L) 04/29/2017     Time Spent in minutes   32 minutes  Greater than 50% of time spent in care coordination and counseling patient regarding the condition and plan of care.   Auburn Bilberry M.D on 05/02/2017 at 12:34 PM  Between 7am to 6pm - Pager - [phone]  After 6pm go to [email or web address] - password [email or web address] [facility]  [facility]   Office  [phone]

## 2017-05-03 DIAGNOSIS — B1920 Unspecified viral hepatitis C without hepatic coma: Secondary | ICD-10-CM

## 2017-05-03 LAB — HEPATIC FUNCTION PANEL
ALBUMIN: 3.5 g/dL (ref 3.5–5.0)
ALK PHOS: 83 U/L (ref 38–126)
ALT: 189 U/L — ABNORMAL HIGH (ref 14–54)
AST: 231 U/L — ABNORMAL HIGH (ref 15–41)
BILIRUBIN DIRECT: 0.3 mg/dL (ref 0.1–0.5)
BILIRUBIN TOTAL: 1 mg/dL (ref 0.3–1.2)
Indirect Bilirubin: 0.7 mg/dL (ref 0.3–0.9)
Total Protein: 7.2 g/dL (ref 6.5–8.1)

## 2017-05-03 LAB — GLUCOSE, CAPILLARY
GLUCOSE-CAPILLARY: 114 mg/dL — AB (ref 65–99)
Glucose-Capillary: 99 mg/dL (ref 65–99)
Glucose-Capillary: 170 mg/dL — ABNORMAL HIGH (ref 65–99)

## 2017-05-03 LAB — HEPATITIS C VRS RNA DETECT BY PCR-QUAL: Hepatitis C Vrs RNA by PCR-Qual: POSITIVE — AB

## 2017-05-03 LAB — AMMONIA: Ammonia: 47 umol/L — ABNORMAL HIGH (ref 9–35)

## 2017-05-03 MED ORDER — LORAZEPAM 1 MG PO TABS
1.0000 mg | ORAL_TABLET | Freq: Four times a day (QID) | ORAL | Status: DC | PRN
  Administered 2017-05-03: 03:00:00 1 mg via ORAL
  Filled 2017-05-03: qty 1

## 2017-05-03 MED ORDER — ACETAMINOPHEN 325 MG PO TABS: 650.0000 mg | ORAL_TABLET | Freq: Four times a day (QID) | ORAL | Status: DC | PRN

## 2017-05-03 MED ORDER — LORAZEPAM 2 MG/ML IJ SOLN: 1.0000 mg | Freq: Four times a day (QID) | INTRAMUSCULAR | Status: DC | PRN

## 2017-05-03 NOTE — Progress Notes (Signed)
Patient has had no symptoms of detoxing this shift.  Pt easily arousable. Medications given this morning without problem. No c/o pain.  Pt encouraged to order meals, lights turns on in room, sat pt up in bed, but when rounding patient has turned off lights and goes back to sleep.  Dr. Allena Katz updated- CIWA protocol and pain medications d/c'd.

## 2017-05-03 NOTE — Progress Notes (Signed)
[facility] [place] at [facility]                                                                                                                                                                                  Patient Demographics   Susan [name], is a 46 y.o. female, DOB - [birth year], ZOX:[id number]  Admit date - 04/28/2017   Admitting Physician Ramonita Lab, MD  Outpatient Primary MD for the patient is [name], MD   LOS - 5  Subjective Continues to be drowsy. Had to receive Ativan last night according to the nurse because she was having a headache, this morning she not showing any evidence of withdrawal Review of Systems:   CONSTITUTIONAL: Drowsy  Vitals:   Vitals:   05/02/17 1322 05/02/17 1900 05/03/17 0530 05/03/17 1221  BP: (!) 96/53 104/62 104/60 100/73  Pulse: 76  65 94  Resp: Temp: 98.2 F (36.8 C) 98 F (36.7 C) 97.6 F (36.4 C) 98 F (36.7 C)  TempSrc: Oral Oral  Oral  SpO2: 96% 96% 98% 99%  Weight:      Height:        Wt Readings from Last 3 Encounters:  05/01/17 163 lb 14.4 oz (74.3 kg)  03/06/17 175 lb (79.4 kg)  02/27/17 175 lb (79.4 kg)    No intake or output data in the 24 hours ending 05/03/17 1237  Physical Exam:   GENERAL: Confused HEAD, EYES, EARS, NOSE AND THROAT: Atraumatic, normocephalic. Extraocular muscles are intact. Pupils equal and reactive to light. Sclerae anicteric. No conjunctival injection. No oro-pharyngeal erythema.  NECK: Supple. There is no jugular venous distention. No bruits, no lymphadenopathy, no thyromegaly.  HEART: Regular rate and rhythm,. No murmurs, no rubs, no clicks.  LUNGS: Clear to auscultation bilaterally. No rales or rhonchi. No wheezes.  ABDOMEN: Soft, flat, mild epigastric tenderness, nondistended. Has good bowel sounds. No hepatosplenomegaly appreciated.  EXTREMITIES: No evidence of any cyanosis, clubbing, or peripheral edema.  +2 pedal and radial pulses bilaterally.   NEUROLOGIC: Drowsy  SKIN: Moist and warm with no rashes appreciated.  Psych: Drowsy LN: No inguinal LN enlargement    Antibiotics   Anti-infectives    None      Medications   Scheduled Meds: . clonazePAM  1 mg Oral BID  . escitalopram  20 mg Oral QHS  . folic acid  1 mg Oral Daily  . furosemide  20 mg Oral BID  . loratadine  10 mg Oral Daily  . mometasone-formoterol  2 puff Inhalation BID  . multivitamin with minerals  1 tablet Oral Daily  . nicotine  14 mg Transdermal Daily  .  pantoprazole  40 mg Oral BID  . potassium chloride SA  40 mEq Oral Daily  . spironolactone  100 mg Oral Daily  . thiamine  100 mg Oral Daily   Continuous Infusions:  PRN Meds:.albuterol, hydrOXYzine, ipratropium-albuterol, traMADol   Data Review:   Micro Results Recent Results (from the past 240 hour(s))  MRSA PCR Screening     Status: None   Collection Time: 04/29/17  9:09 PM  Result Value Ref Range Status   MRSA by PCR NEGATIVE NEGATIVE Final    Comment:        The GeneXpert MRSA Assay (FDA approved for NASAL specimens only), is one component of a comprehensive MRSA colonization surveillance program. It is not intended to diagnose MRSA infection nor to guide or monitor treatment for MRSA infections.     Radiology Reports Ct Head Wo Contrast  Result Date: 04/29/2017 CLINICAL DATA:  Combative, acute psychosis.  Alcohol withdrawal. EXAM: CT HEAD WITHOUT CONTRAST TECHNIQUE: Contiguous axial images were obtained from the base of the skull through the vertex without intravenous contrast. COMPARISON:  CT HEAD February 27, 2017 FINDINGS: BRAIN: No intraparenchymal hemorrhage, mass effect nor midline shift. The ventricles and sulci are normal. No acute large vascular territory infarcts. No abnormal extra-axial fluid collections. Basal cisterns are patent. VASCULAR: Trace calcific atherosclerosis. SKULL/SOFT TISSUES: No skull fracture. No significant soft tissue swelling. Skin staple LEFT  parietal scalp. ORBITS/SINUSES: The included ocular globes and orbital contents are normal.The mastoid aircells and included paranasal sinuses are well-aerated. OTHER: None. IMPRESSION: 1. Negative noncontrast CT HEAD. 2. Residual staple LEFT parietal scalp. Electronically Signed   By: Awilda Metro M.D.   On: 04/29/2017 23:04   Mr 3d Recon At Scanner  Result Date: 05/01/2017 CLINICAL DATA:  Cirrhosis cirrhosis. Fibrosis risk identified on ultrasound EXAM: MRI ABDOMEN WITHOUT AND WITH CONTRAST (INCLUDING MRCP) TECHNIQUE: Multiplanar multisequence MR imaging of the abdomen was performed both before and after the administration of intravenous contrast. Heavily T2-weighted images of the biliary and pancreatic ducts were obtained, and three-dimensional MRCP images were rendered by post processing. CONTRAST:  15mL MULTIHANCE GADOBENATE DIMEGLUMINE 529 MG/ML IV SOLN COMPARISON:  Ultrasound 11/17/2015 FINDINGS: Lower chest:  Lung bases are clear. Hepatobiliary: The present nodular contour. Minimal linear high T2 signal often seen with fibrosis of cirrhosis. Gallbladder normal. No intrahepatic duct dilatation. The common bile duct is upper limits of normal in 6-7 mm no obstructing stone or lesion identified. Postcontrast imaging demonstrates no enhancing hepatic lesion. There is some linear delayed enhancement suggesting a or architectural distortion./fibrosis Pancreas: No pancreatic duct dilatation or inflammation. Spleen: Normal spleen. Adrenals/urinary tract: Adrenal glands and kidneys are normal. Stomach/Bowel: Stomach and limited of the small bowel is unremarkable Vascular/Lymphatic: Abdominal aortic normal caliber. No retroperitoneal periportal lymphadenopathy. Musculoskeletal: No aggressive osseous lesion IMPRESSION: 1. Morphologic changes in liver consistent cirrhosis with mild fibrosis. 2. No evidence hepatoma. 3. No ascites.  Spleen normal volume. 4. No intrahepatic duct dilatation. Common bile duct upper  limits normal without obstructing lesion. Electronically Signed   By: Genevive Bi M.D.   On: 05/01/2017 08:10   Mr Abdomen Mrcp Vivien Rossetti Contast  Result Date: 05/01/2017 CLINICAL DATA:  Cirrhosis cirrhosis. Fibrosis risk identified on ultrasound EXAM: MRI ABDOMEN WITHOUT AND WITH CONTRAST (INCLUDING MRCP) TECHNIQUE: Multiplanar multisequence MR imaging of the abdomen was performed both before and after the administration of intravenous contrast. Heavily T2-weighted images of the biliary and pancreatic ducts were obtained, and three-dimensional MRCP images were rendered by post processing. CONTRAST:  15mL MULTIHANCE GADOBENATE DIMEGLUMINE 529 MG/ML IV SOLN COMPARISON:  Ultrasound 11/17/2015 FINDINGS: Lower chest:  Lung bases are clear. Hepatobiliary: The present nodular contour. Minimal linear high T2 signal often seen with fibrosis of cirrhosis. Gallbladder normal. No intrahepatic duct dilatation. The common bile duct is upper limits of normal in 6-7 mm no obstructing stone or lesion identified. Postcontrast imaging demonstrates no enhancing hepatic lesion. There is some linear delayed enhancement suggesting a or architectural distortion./fibrosis Pancreas: No pancreatic duct dilatation or inflammation. Spleen: Normal spleen. Adrenals/urinary tract: Adrenal glands and kidneys are normal. Stomach/Bowel: Stomach and limited of the small bowel is unremarkable Vascular/Lymphatic: Abdominal aortic normal caliber. No retroperitoneal periportal lymphadenopathy. Musculoskeletal: No aggressive osseous lesion IMPRESSION: 1. Morphologic changes in liver consistent cirrhosis with mild fibrosis. 2. No evidence hepatoma. 3. No ascites.  Spleen normal volume. 4. No intrahepatic duct dilatation. Common bile duct upper limits normal without obstructing lesion. Electronically Signed   By: Genevive Bi M.D.   On: 05/01/2017 08:10   US Abdomen Limited Ruq  Result Date: 04/28/2017 CLINICAL DATA:  Abdominal pain with nausea  for 5 days EXAM: ULTRASOUND ABDOMEN LIMITED RIGHT UPPER QUADRANT COMPARISON:  11/17/2015 FINDINGS: Gallbladder: No gallstones or wall thickening visualized. No sonographic Murphy sign noted by sonographer. Common bile duct: Diameter: Maximum diameter of 8.5 mm. Liver: No focal lesion identified. Nodular liver contour, suspicious for cirrhosis. Portal vein is patent on color Doppler imaging with normal direction of blood flow towards the liver. IMPRESSION: 1. Negative for acute cholecystitis 2. Prominent extrahepatic bile duct up to 8.5 mm, suggest correlation with laboratory values 3. Nodular liver contour suspicious for cirrhosis. Electronically Signed   By: Jasmine Pang M.D.   On: 04/28/2017 18:08     CBC  Recent Labs Lab 04/28/17 1200 04/29/17 0359  WBC 6.8 5.0  HGB 15.4 13.5  HCT 43.6 37.8  PLT 71* 62*  MCV 101.7* 102.0*  MCH 35.9* 36.5*  MCHC 35.3 35.8  RDW 12.8 12.7    Chemistries   Recent Labs Lab 04/28/17 1200 04/29/17 0359 04/30/17 0350 05/03/17 0450  NA 130* 131* 132*  --   K 3.5 3.6 3.7  --   CL 98* 103 105  --   CO2 20* 20* 22  --   GLUCOSE 113* 95 100*  --   BUN --   CREATININE 0.42* 0.60 <0.30*  --   CALCIUM 9.3 9.1 8.9  --   MG  --   --  1.8  --   AST 248* 186* 293* 231*  ALT 137* 112* 155* 189*  ALKPHOS 117 101 85 83  BILITOT 4.2* 3.0* 2.5* 1.0   ------------------------------------------------------------------------------------------------------------------ CrCl cannot be calculated (This lab value cannot be used to calculate CrCl because it is not a number: <0.30). ------------------------------------------------------------------------------------------------------------------ No results for input(s): HGBA1C in the last 72 hours. ------------------------------------------------------------------------------------------------------------------ No results for input(s): CHOL, HDL, LDLCALC, TRIG, CHOLHDL, LDLDIRECT in the last 72  hours. ------------------------------------------------------------------------------------------------------------------ No results for input(s): TSH, T4TOTAL, T3FREE, THYROIDAB in the last 72 hours.  Invalid input(s): FREET3 ------------------------------------------------------------------------------------------------------------------ No results for input(s): VITAMINB12, FOLATE, FERRITIN, TIBC, IRON, RETICCTPCT in the last 72 hours.  Coagulation profile No results for input(s): INR, PROTIME in the last 168 hours.  No results for input(s): DDIMER in the last 72 hours.  Cardiac Enzymes No results for input(s): CKMB, TROPONINI, MYOGLOBIN in the last 168 hours.  Invalid input(s): CK ------------------------------------------------------------------------------------------------------------------ Invalid input(s): POCBNP    Assessment & Plan  Patient is a 46 year old  with abdominal pain  #: alcohol withdrawal  Patient has been here since the 22nd she should be out of the withdrawal period Due to her continuing to being drowsy I will stop Ativan and sedating pain meds   #acute abdominal pain: No cause of the pain identified With dilated common bile duct GI following MRCP shows no stone or obstruction Continue to monitor  # obstructive jaundice with prominent extrahepatic bile duct up to 8.5 mm Elevated liver function test  #chronic history of hepatitis C and liver cirrhosis Continue home medication Aldactone  Lasix and potassium supplements on hold  #GERD PPI  #Thrombocytopenia secondary to liver cirrhosis No active bleeding or bruising. Monitor platelet count closely  Tobacco abuse disorder counsel patient regarding stop smoking     Code Status Orders        Start     Ordered   04/28/17 2015  Full code  Continuous     04/28/17 2014    Code Status History    Date Active Date Inactive Code Status Order ID Comments User Context   04/02/2015  2:10 PM  04/07/2015  5:39 PM Full Code [id number]  Adrian Saran, MD Inpatient   12/09/2014  5:13 PM 12/12/2014  5:06 PM Full Code [id number]  Altamese Dilling, MD Inpatient           Consults  gastroenterology  DVT Prophylaxis  SCDs due to thrombocytopenia  Lab Results  Component Value Date   PLT 62 (L) 04/29/2017     Time Spent in minutes   32 minutes  Greater than 50% of time spent in care coordination and counseling patient regarding the condition and plan of care.   Auburn Bilberry M.D on 05/03/2017 at 12:37 PM  Between 7am to 6pm - Pager - [phone]  After 6pm go to [email or web address] - password [email or web address] [facility]  [facility]   Office  [phone]

## 2017-05-03 NOTE — Progress Notes (Signed)
Pt updated on current plan of care. Pt states she didn't sleep well last night so that's why she has slept all day today. Pt educated on importance of her being alert and getting back to a normal schedule to prepare for potential discharge tomorrow along with importance of stopping all alcohol intake. GI signed off as she will follow up outpatient. Pt refused education and local AA information.

## 2017-05-03 NOTE — Care Management (Signed)
CIWA score as high as 9 within last 24 hours.  IVC has been discontinued

## 2017-05-03 NOTE — Progress Notes (Signed)
   Susan Mood MD, MRCP(U.K) [address]  [address]  [place], [place] [address]  Main: [id number]    Susan [name] is being followed for abnormal LFT's   Subjective: No new complaints, shaking    Objective: Vital signs in last 24 hours: Vitals:   05/02/17 1322 05/02/17 1900 05/03/17 0530 05/03/17 1221  BP: (!) 96/53 104/62 104/60 100/73  Pulse: 76  65 94  Resp: Temp: 98.2 F (36.8 C) 98 F (36.7 C) 97.6 F (36.4 C) 98 F (36.7 C)  TempSrc: Oral Oral  Oral  SpO2: 96% 96% 98% 99%  Weight:      Height:       Weight change:  No intake or output data in the 24 hours ending 05/03/17 1356   Exam: Heart:: Regular rate and rhythm, S1S2 present or without murmur or extra heart sounds Lungs: normal, clear to auscultation and clear to auscultation and percussion Abdomen: soft, nontender, normal bowel sounds   Lab Results: @ Micro Results: Recent Results (from the past 240 hour(s))  MRSA PCR Screening     Status: None   Collection Time: 04/29/17  9:09 PM  Result Value Ref Range Status   MRSA by PCR NEGATIVE NEGATIVE Final    Comment:        The GeneXpert MRSA Assay (FDA approved for NASAL specimens only), is one component of a comprehensive MRSA colonization surveillance program. It is not intended to diagnose MRSA infection nor to guide or monitor treatment for MRSA infections.    Studies/Results: No results found. Medications: I have reviewed the patient's current medications. Scheduled Meds: . clonazePAM  1 mg Oral BID  . escitalopram  20 mg Oral QHS  . folic acid  1 mg Oral Daily  . furosemide  20 mg Oral BID  . loratadine  10 mg Oral Daily  . mometasone-formoterol  2 puff Inhalation BID  . multivitamin with minerals  1 tablet Oral Daily  . nicotine  14 mg Transdermal Daily  . pantoprazole  40 mg Oral BID  . potassium chloride SA  40 mEq Oral Daily  . spironolactone  100 mg Oral Daily  . thiamine  100 mg Oral Daily    Continuous Infusions: PRN Meds:.albuterol, hydrOXYzine, ipratropium-albuterol, traMADol   Assessment: Principal Problem:   Delirium tremens (HCC) Active Problems:   Acute abdominal pain   Biliary obstruction   Alcohol withdrawal syndrome, with delirium (HCC)  Susan [name] a 45 y.o.y/o femalewith liver cirrhosis from possible alcohol/hepatitis C . Follows with Susan [name] GI , admitted with RUQ pain , elevated transaminases, and T bilirubin of 4 , dilated CBD on RUQ USG 8.5 mm. No gall stones seen on CT scan Likely abnormal LFT's due to alcohol. No stones seen on CBD in MRCP. Abnormal LFT's likely a combination from alcohol and hepatitis C   Plan: 1.Stop all alcohol, still has features of withdrawal 2. Outpatient GI follow up for discussion of hepatitis C treatment and management of liver cirrhosis.   I will sign off.  Please call me if any further GI concerns or questions.  We would like to thank you for the opportunity to participate in the care of [employer] .    LOS: 5 days   Susan [name] 05/03/2017, 1:56 PM

## 2017-05-04 LAB — GLUCOSE, CAPILLARY
Glucose-Capillary: 106 mg/dL — ABNORMAL HIGH (ref 65–99)
Glucose-Capillary: 161 mg/dL — ABNORMAL HIGH (ref 65–99)

## 2017-05-04 MED ORDER — POTASSIUM CHLORIDE ER 20 MEQ PO TBCR: 40.0000 meq | EXTENDED_RELEASE_TABLET | Freq: Every day | ORAL | 1 refills | Status: AC

## 2017-05-04 MED ORDER — OXYCODONE HCL 10 MG PO TABS: 10.0000 mg | ORAL_TABLET | Freq: Four times a day (QID) | ORAL | 0 refills | Status: DC | PRN

## 2017-05-04 MED ORDER — BUDESONIDE-FORMOTEROL FUMARATE 80-4.5 MCG/ACT IN AERO
2.0000 | INHALATION_SPRAY | Freq: Two times a day (BID) | RESPIRATORY_TRACT | 12 refills | Status: AC
Start: 2017-05-04 — End: ?

## 2017-05-04 MED ORDER — FUROSEMIDE 40 MG PO TABS: 40.0000 mg | ORAL_TABLET | Freq: Two times a day (BID) | ORAL | 0 refills | Status: AC

## 2017-05-04 MED ORDER — TRAMADOL HCL 50 MG PO TABS: 50.0000 mg | ORAL_TABLET | Freq: Four times a day (QID) | ORAL | 0 refills | Status: DC | PRN

## 2017-05-04 MED ORDER — CLONAZEPAM 1 MG PO TABS: 1.0000 mg | ORAL_TABLET | Freq: Two times a day (BID) | ORAL | 0 refills | Status: AC

## 2017-05-04 MED ORDER — LORATADINE 10 MG PO TABS: 10.0000 mg | ORAL_TABLET | Freq: Every day | ORAL | 0 refills | Status: AC

## 2017-05-04 MED ORDER — SPIRONOLACTONE 100 MG PO TABS: 100.0000 mg | ORAL_TABLET | Freq: Every day | ORAL | 0 refills | Status: AC

## 2017-05-04 MED ORDER — ESCITALOPRAM OXALATE 20 MG PO TABS: 20.0000 mg | ORAL_TABLET | Freq: Every day | ORAL | 0 refills | Status: AC

## 2017-05-04 MED ORDER — ALBUTEROL SULFATE HFA 108 (90 BASE) MCG/ACT IN AERS: 1.0000 | INHALATION_SPRAY | RESPIRATORY_TRACT | 0 refills | Status: AC | PRN

## 2017-05-04 MED ORDER — OMEPRAZOLE 40 MG PO CPDR: 40.0000 mg | DELAYED_RELEASE_CAPSULE | Freq: Every day | ORAL | 0 refills | Status: DC

## 2017-05-04 NOTE — Progress Notes (Signed)
Pt alert and awake this morning. Steady gait noted while walking to bathroom. Moderate fall risk. Patient aware of potential discharge and encouraged to communicate with family and friends about discharge planning for today.

## 2017-05-04 NOTE — Progress Notes (Signed)
Dr. Allena Katz paged to be notified patient needs prescriptions for all home medications and would like to have oxycodone instead of tramadol for pain medication.

## 2017-05-04 NOTE — Discharge Instructions (Signed)
[facility] [place] at [facility] ° °DIET:  °Cardiac diet ° °DISCHARGE CONDITION:  °Stable ° °ACTIVITY:  °Activity as tolerated ° °OXYGEN:  °Home Oxygen: No. °  °Oxygen Delivery: room air ° °DISCHARGE LOCATION:  °home  ° ° °ADDITIONAL DISCHARGE INSTRUCTION: ° ° °If you experience worsening of your admission symptoms, develop shortness of breath, life threatening emergency, suicidal or homicidal thoughts you must seek medical attention immediately by calling 911 or calling your MD immediately  if symptoms less severe. ° °You Must read complete instructions/literature along with all the possible adverse reactions/side effects for all the Medicines you take and that have been prescribed to you. Take any new Medicines after you have completely understood and accpet all the possible adverse reactions/side effects.  ° °Please note ° °You were cared for by a hospitalist during your hospital stay. If you have any questions about your discharge medications or the care you received while you were in the hospital after you are discharged, you can call the unit and asked to speak with the hospitalist on call if the hospitalist that took care of you is not available. Once you are discharged, your primary care physician will handle any further medical issues. Please note that NO REFILLS for any discharge medications will be authorized once you are discharged, as it is imperative that you return to your primary care physician (or establish a relationship with a primary care physician if you do not have one) for your aftercare needs so that they can reassess your need for medications and monitor your lab values. ° ° °

## 2017-05-04 NOTE — Discharge Summary (Addendum)
[facility] [place] [facility], 46 y.o., DOB March 04, 1971, MRN [id number]. Admission date: 04/28/2017 Discharge Date 05/04/2017 Primary MD [name], MD Admitting Physician Ramonita Lab, MD  Admission Diagnosis  Elevated bilirubin [R17] Abdominal pain [R10.9]  Discharge Diagnosis   Principal Problem:   Delirium tremens [facility]) with alcohol withdrawal   Acute abdominal pain MRCP negative appears to be chronic   Elevated bilirubin due to chronic liver disease no evidence of obstruction noted   GERD   Thrombocytopenia Tobacco use disorder        Hospital Course  [name] is a 46 year old white female who was admitted with abdominal pain. And noted to have elevated bilirubin. Evaluation showed that her common bile duct appeared enlarged. Therefore she was admitted for further evaluation. By day 2 [name] started having signs and symptoms of withdrawal. She had to move to ICU and was placed on Precedex drip. Subsequently had to receive Ativan. Her alcohol withdrawal symptoms have resolved now.  Abdominal pain [name] was noted to have elevated bilirubin. Therefore further evaluation was done which showed a dilated common bile duct. Therefore GI consult was obtained. She underwent a MRCP which showed no obstruction. GI stated that this was adequate no further workup was needed.         Consults  GI, psychiatry  Significant Tests:  See full reports for all details    Ct Head Wo Contrast  Result Date: 04/29/2017 CLINICAL DATA:  Combative, acute psychosis.  Alcohol withdrawal. EXAM: CT HEAD WITHOUT CONTRAST TECHNIQUE: Contiguous axial images were obtained from the base of the skull through the vertex without intravenous contrast. COMPARISON:  CT HEAD February 27, 2017 FINDINGS: BRAIN: No intraparenchymal hemorrhage, mass effect nor midline shift. The ventricles and sulci are normal. No acute large vascular territory infarcts. No abnormal extra-axial  fluid collections. Basal cisterns are patent. VASCULAR: Trace calcific atherosclerosis. SKULL/SOFT TISSUES: No skull fracture. No significant soft tissue swelling. Skin staple LEFT parietal scalp. ORBITS/SINUSES: The included ocular globes and orbital contents are normal.The mastoid aircells and included paranasal sinuses are well-aerated. OTHER: None. IMPRESSION: 1. Negative noncontrast CT HEAD. 2. Residual staple LEFT parietal scalp. Electronically Signed   By: Awilda Metro M.D.   On: 04/29/2017 23:04   Mr 3d Recon At Scanner  Result Date: 05/01/2017 CLINICAL DATA:  Cirrhosis cirrhosis. Fibrosis risk identified on ultrasound EXAM: MRI ABDOMEN WITHOUT AND WITH CONTRAST (INCLUDING MRCP) TECHNIQUE: Multiplanar multisequence MR imaging of the abdomen was performed both before and after the administration of intravenous contrast. Heavily T2-weighted images of the biliary and pancreatic ducts were obtained, and three-dimensional MRCP images were rendered by post processing. CONTRAST:  15mL MULTIHANCE GADOBENATE DIMEGLUMINE 529 MG/ML IV SOLN COMPARISON:  Ultrasound 11/17/2015 FINDINGS: Lower chest:  Lung bases are clear. Hepatobiliary: The present nodular contour. Minimal linear high T2 signal often seen with fibrosis of cirrhosis. Gallbladder normal. No intrahepatic duct dilatation. The common bile duct is upper limits of normal in 6-7 mm no obstructing stone or lesion identified. Postcontrast imaging demonstrates no enhancing hepatic lesion. There is some linear delayed enhancement suggesting a or architectural distortion./fibrosis Pancreas: No pancreatic duct dilatation or inflammation. Spleen: Normal spleen. Adrenals/urinary tract: Adrenal glands and kidneys are normal. Stomach/Bowel: Stomach and limited of the small bowel is unremarkable Vascular/Lymphatic: Abdominal aortic normal caliber. No retroperitoneal periportal lymphadenopathy. Musculoskeletal: No aggressive osseous lesion IMPRESSION: 1. Morphologic  changes in liver consistent cirrhosis with mild fibrosis. 2. No evidence hepatoma. 3. No ascites.  Spleen  normal volume. 4. No intrahepatic duct dilatation. Common bile duct upper limits normal without obstructing lesion. Electronically Signed   By: Genevive Bi M.D.   On: 05/01/2017 08:10   Mr Abdomen Mrcp Vivien Rossetti Contast  Result Date: 05/01/2017 CLINICAL DATA:  Cirrhosis cirrhosis. Fibrosis risk identified on ultrasound EXAM: MRI ABDOMEN WITHOUT AND WITH CONTRAST (INCLUDING MRCP) TECHNIQUE: Multiplanar multisequence MR imaging of the abdomen was performed both before and after the administration of intravenous contrast. Heavily T2-weighted images of the biliary and pancreatic ducts were obtained, and three-dimensional MRCP images were rendered by post processing. CONTRAST:  15mL MULTIHANCE GADOBENATE DIMEGLUMINE 529 MG/ML IV SOLN COMPARISON:  Ultrasound 11/17/2015 FINDINGS: Lower chest:  Lung bases are clear. Hepatobiliary: The present nodular contour. Minimal linear high T2 signal often seen with fibrosis of cirrhosis. Gallbladder normal. No intrahepatic duct dilatation. The common bile duct is upper limits of normal in 6-7 mm no obstructing stone or lesion identified. Postcontrast imaging demonstrates no enhancing hepatic lesion. There is some linear delayed enhancement suggesting a or architectural distortion./fibrosis Pancreas: No pancreatic duct dilatation or inflammation. Spleen: Normal spleen. Adrenals/urinary tract: Adrenal glands and kidneys are normal. Stomach/Bowel: Stomach and limited of the small bowel is unremarkable Vascular/Lymphatic: Abdominal aortic normal caliber. No retroperitoneal periportal lymphadenopathy. Musculoskeletal: No aggressive osseous lesion IMPRESSION: 1. Morphologic changes in liver consistent cirrhosis with mild fibrosis. 2. No evidence hepatoma. 3. No ascites.  Spleen normal volume. 4. No intrahepatic duct dilatation. Common bile duct upper limits normal without  obstructing lesion. Electronically Signed   By: Genevive Bi M.D.   On: 05/01/2017 08:10   US Abdomen Limited Ruq  Result Date: 04/28/2017 CLINICAL DATA:  Abdominal pain with nausea for 5 days EXAM: ULTRASOUND ABDOMEN LIMITED RIGHT UPPER QUADRANT COMPARISON:  11/17/2015 FINDINGS: Gallbladder: No gallstones or wall thickening visualized. No sonographic Murphy sign noted by sonographer. Common bile duct: Diameter: Maximum diameter of 8.5 mm. Liver: No focal lesion identified. Nodular liver contour, suspicious for cirrhosis. Portal vein is patent on color Doppler imaging with normal direction of blood flow towards the liver. IMPRESSION: 1. Negative for acute cholecystitis 2. Prominent extrahepatic bile duct up to 8.5 mm, suggest correlation with laboratory values 3. Nodular liver contour suspicious for cirrhosis. Electronically Signed   By: Jasmine Pang M.D.   On: 04/28/2017 18:08       Today   Subjective:   Susan [name] complains of some abdominal discomfort but now oriented  Objective:   Blood pressure 107/60, pulse 75, temperature 98.4 F (36.9 C), temperature source Oral, resp. rate 16, height  (1.6 m), weight 163 lb 14.4 oz (74.3 kg), SpO2 96 %.  .  Intake/Output Summary (Last 24 hours) at 05/04/17 1422 Last data filed at 05/04/17 1000  Gross per 24 hour  Intake              240 ml  Output                0 ml  Net              240 ml    Exam VITAL SIGNS: Blood pressure 107/60, pulse 75, temperature 98.4 F (36.9 C), temperature source Oral, resp. rate 16, height  (1.6 m), weight 163 lb 14.4 oz (74.3 kg), SpO2 96 %.  GENERAL:  46 y.o.-year-old [name] lying in the bed with no acute distress.  EYES: Pupils equal, round, reactive to light and accommodation. No scleral icterus. Extraocular muscles intact.  HEENT: Head atraumatic,  normocephalic. Oropharynx and nasopharynx clear.  NECK:  Supple, no jugular venous distention. No thyroid enlargement, no tenderness.   LUNGS: Normal breath sounds bilaterally, no wheezing, rales,rhonchi or crepitation. No use of accessory muscles of respiration.  CARDIOVASCULAR: S1, S2 normal. No murmurs, rubs, or gallops.  ABDOMEN: Soft, nontender, nondistended. Bowel sounds present. No organomegaly or mass.  EXTREMITIES: No pedal edema, cyanosis, or clubbing.  NEUROLOGIC: Cranial nerves II through XII are intact. Muscle strength 5/5 in all extremities. Sensation intact. Gait not checked.  PSYCHIATRIC: The [name] is alert and oriented x 3.  SKIN: No obvious rash, lesion, or ulcer.   Data Review     CBC w Diff:  Lab Results  Component Value Date   WBC 5.0 04/29/2017   HGB 13.5 04/29/2017   HGB 14.4 05/10/2013   HCT 37.8 04/29/2017   HCT 40.4 05/10/2013   PLT 62 (L) 04/29/2017   PLT 152 05/10/2013   LYMPHOPCT 46 02/27/2017   LYMPHOPCT 20.1 03/24/2013   MONOPCT 8 02/27/2017   MONOPCT 6.3 03/24/2013   EOSPCT 3 02/27/2017   EOSPCT 0.7 03/24/2013   BASOPCT 1 02/27/2017   BASOPCT 0.5 03/24/2013   CMP:  Lab Results  Component Value Date   NA 132 (L) 04/30/2017   NA 137 05/10/2013   K 3.7 04/30/2017   K 3.4 (L) 05/10/2013   CL 105 04/30/2017   CL 103 05/10/2013   CO2 22 04/30/2017   CO2 21 05/10/2013   BUN 10 04/30/2017   BUN 6 (L) 05/10/2013   CREATININE <0.30 (L) 04/30/2017   CREATININE 0.63 05/10/2013   PROT 7.2 05/03/2017   PROT 7.4 05/10/2013   ALBUMIN 3.5 05/03/2017   ALBUMIN 3.5 05/10/2013   BILITOT 1.0 05/03/2017   BILITOT 0.5 05/10/2013   ALKPHOS 83 05/03/2017   ALKPHOS 99 05/10/2013   AST 231 (H) 05/03/2017   AST 31 05/10/2013   ALT 189 (H) 05/03/2017   ALT 26 05/10/2013  .  Micro Results Recent Results (from the past 240 hour(s))  MRSA PCR Screening     Status: None   Collection Time: 04/29/17  9:09 PM  Result Value Ref Range Status   MRSA by PCR NEGATIVE NEGATIVE Final    Comment:        The GeneXpert MRSA Assay (FDA approved for NASAL specimens only), is one component of  a comprehensive MRSA colonization surveillance program. It is not intended to diagnose MRSA infection nor to guide or monitor treatment for MRSA infections.         Code Status Orders        Start     Ordered   04/28/17 2015  Full code  Continuous     04/28/17 2014    Code Status History    Date Active Date Inactive Code Status Order ID Comments User Context   04/02/2015  2:10 PM 04/07/2015  5:39 PM Full Code [id number]  Adrian Saran, MD Inpatient   12/09/2014  5:13 PM 12/12/2014  5:06 PM Full Code [id number]  Altamese Dilling, MD Inpatient          Follow-up Information    Louellen Molder, NP Follow up in 1 week(s).   Specialty:  Gastroenterology Why:  f/u liver cirrhosis Contact information: [address] [facility] [place] [address] [phone]        [name], MD Follow up in 1 week(s).   Specialty:  Internal Medicine Contact information: [address] [place] [address]  [phone]           Discharge Medications   Allergies as of 05/04/2017      Reactions   Doxycycline Hives   Latex Hives   Promethazine Diarrhea   Ranitidine Nausea Only   Zofran [ondansetron Hcl] Itching      Medication List    TAKE these medications   albuterol 108 (90 Base) MCG/ACT inhaler Commonly known as:  PROVENTIL HFA;VENTOLIN HFA Inhale 1 puff into the lungs every 4 (four) hours as needed for wheezing or shortness of breath.   budesonide-formoterol 80-4.5 MCG/ACT inhaler Commonly known as:  SYMBICORT Inhale 2 puffs into the lungs 2 (two) times daily.   clonazePAM 1 MG tablet Commonly known as:  KLONOPIN Take 1 tablet (1 mg total) by mouth 2 (two) times daily.   escitalopram 20 MG tablet Commonly known as:  LEXAPRO Take 1 tablet (20 mg total) by mouth at bedtime.   furosemide 40 MG tablet Commonly known as:  LASIX Take 1 tablet (40 mg total) by mouth 2 (two) times daily.   hydrOXYzine 25 MG  tablet Commonly known as:  ATARAX/VISTARIL Take 1 tablet (25 mg total) by mouth 3 (three) times daily as needed (itching).   ipratropium-albuterol 0.5-2.5 (3) MG/3ML Soln Commonly known as:  DUONEB Take 3 mLs by nebulization every 6 (six) hours as needed (for shortness of breath/wheezing).   loratadine 10 MG tablet Commonly known as:  CLARITIN Take 1 tablet (10 mg total) by mouth daily.   omeprazole 40 MG capsule Commonly known as:  PRILOSEC Take 1 capsule (40 mg total) by mouth daily.   Oxycodone HCl 10 MG Tabs Take 1 tablet (10 mg total) by mouth every 6 (six) hours as needed.   Potassium Chloride ER 20 MEQ Tbcr Take 40 mEq by mouth daily.   spironolactone 100 MG tablet Commonly known as:  ALDACTONE Take 1 tablet (100 mg total) by mouth daily.            Discharge Care Instructions        Start     Ordered   05/04/17 0000  albuterol (PROVENTIL HFA;VENTOLIN HFA) 108 (90 Base) MCG/ACT inhaler  Every 4 hours PRN     05/04/17 1421   05/04/17 0000  budesonide-formoterol (SYMBICORT) 80-4.5 MCG/ACT inhaler  2 times daily     05/04/17 1421   05/04/17 0000  clonazePAM (KLONOPIN) 1 MG tablet  2 times daily     05/04/17 1421   05/04/17 0000  escitalopram (LEXAPRO) 20 MG tablet  Daily at bedtime     05/04/17 1421   05/04/17 0000  loratadine (CLARITIN) 10 MG tablet  Daily     05/04/17 1421   05/04/17 0000  furosemide (LASIX) 40 MG tablet  2 times daily     05/04/17 1421   05/04/17 0000  spironolactone (ALDACTONE) 100 MG tablet  Daily     05/04/17 1421   05/04/17 0000  Potassium Chloride ER 20 MEQ TBCR  Daily     05/04/17 1421   05/04/17 0000  omeprazole (PRILOSEC) 40 MG capsule  Daily     05/04/17 1421   05/04/17 0000  Oxycodone HCl 10 MG TABS  Every 6 hours PRN     05/04/17 1421         Total Time in preparing paper work, data evaluation and todays exam - 35 minutes  Auburn Bilberry M.D on 05/04/2017 at 2:22 [facility]  [facility]   Office   [phone]

## 2017-05-04 NOTE — Progress Notes (Signed)
Discharge paperwork reviewed with patient who verbalized understanding of new medications and follow up appointments. Prescription for Oxycodone, Lomotil, and Klonopin given to patient- the rest of the prescriptions sent electronically. Patient's boyfriend to transport home.

## 2017-06-15 ENCOUNTER — Encounter: Payer: Self-pay | Admitting: Emergency Medicine

## 2017-06-15 ENCOUNTER — Other Ambulatory Visit: Payer: Self-pay

## 2017-06-15 ENCOUNTER — Emergency Department
Admission: EM | Admit: 2017-06-15 | Discharge: 2017-06-15 | Disposition: A | Discharge status: Home / Self Care | Payer: Medicaid Other | Attending: Emergency Medicine | Admitting: Emergency Medicine

## 2017-06-15 DIAGNOSIS — Z8719 Personal history of other diseases of the digestive system: Secondary | ICD-10-CM | POA: Insufficient documentation

## 2017-06-15 DIAGNOSIS — F17210 Nicotine dependence, cigarettes, uncomplicated: Secondary | ICD-10-CM | POA: Diagnosis not present

## 2017-06-15 DIAGNOSIS — N6011 Diffuse cystic mastopathy of right breast: Secondary | ICD-10-CM

## 2017-06-15 DIAGNOSIS — J45909 Unspecified asthma, uncomplicated: Secondary | ICD-10-CM | POA: Diagnosis not present

## 2017-06-15 DIAGNOSIS — J449 Chronic obstructive pulmonary disease, unspecified: Secondary | ICD-10-CM | POA: Diagnosis not present

## 2017-06-15 DIAGNOSIS — Z79899 Other long term (current) drug therapy: Secondary | ICD-10-CM | POA: Diagnosis not present

## 2017-06-15 DIAGNOSIS — I10 Essential (primary) hypertension: Secondary | ICD-10-CM | POA: Diagnosis not present

## 2017-06-15 DIAGNOSIS — K602 Anal fissure, unspecified: Secondary | ICD-10-CM

## 2017-06-15 DIAGNOSIS — R1084 Generalized abdominal pain: Secondary | ICD-10-CM | POA: Diagnosis present

## 2017-06-15 LAB — COMPREHENSIVE METABOLIC PANEL
ALK PHOS: 111 U/L (ref 38–126)
ALT: 109 U/L — AB (ref 14–54)
ANION GAP: 11 (ref 5–15)
AST: 225 U/L — ABNORMAL HIGH (ref 15–41)
Albumin: 3.6 g/dL (ref 3.5–5.0)
BILIRUBIN TOTAL: 1.1 mg/dL (ref 0.3–1.2)
BUN: <5 mg/dL — ABNORMAL LOW (ref 6–20)
CALCIUM: 8.6 mg/dL — AB (ref 8.9–10.3)
CO2: 22 mmol/L (ref 22–32)
Chloride: 102 mmol/L (ref 101–111)
Creatinine, Ser: 0.46 mg/dL (ref 0.44–1.00)
Glucose, Bld: 97 mg/dL (ref 65–99)
Potassium: 3.6 mmol/L (ref 3.5–5.1)
Sodium: 135 mmol/L (ref 135–145)
TOTAL PROTEIN: 8.6 g/dL — AB (ref 6.5–8.1)

## 2017-06-15 LAB — CBC
HCT: 44.6 % (ref 35.0–47.0)
HEMOGLOBIN: 15.1 g/dL (ref 12.0–16.0)
MCH: 34.1 pg — AB (ref 26.0–34.0)
MCHC: 33.8 g/dL (ref 32.0–36.0)
MCV: 100.8 fL — ABNORMAL HIGH (ref 80.0–100.0)
PLATELETS: 113 10*3/uL — AB (ref 150–440)
RBC: 4.43 MIL/uL (ref 3.80–5.20)
RDW: 14.2 % (ref 11.5–14.5)
WBC: 6.5 10*3/uL (ref 3.6–11.0)

## 2017-06-15 LAB — URINALYSIS, COMPLETE (UACMP) WITH MICROSCOPIC
BILIRUBIN URINE: NEGATIVE
Bacteria, UA: NONE SEEN
GLUCOSE, UA: NEGATIVE mg/dL
HGB URINE DIPSTICK: NEGATIVE
KETONES UR: NEGATIVE mg/dL
LEUKOCYTES UA: NEGATIVE
Nitrite: NEGATIVE
PROTEIN: NEGATIVE mg/dL
Specific Gravity, Urine: 1.01 (ref 1.005–1.030)
WBC, UA: NONE SEEN WBC/hpf (ref 0–5)
pH: 6 (ref 5.0–8.0)

## 2017-06-15 LAB — LIPASE, BLOOD: Lipase: 43 U/L (ref 11–51)

## 2017-06-15 LAB — POCT PREGNANCY, URINE: Preg Test, Ur: NEGATIVE

## 2017-06-15 MED ORDER — FAMOTIDINE 20 MG PO TABS: 20.0000 mg | ORAL_TABLET | Freq: Two times a day (BID) | ORAL | 0 refills | Status: DC

## 2017-06-15 MED ORDER — METOCLOPRAMIDE HCL 10 MG PO TABS: 10.0000 mg | ORAL_TABLET | Freq: Four times a day (QID) | ORAL | 0 refills | Status: DC | PRN

## 2017-06-15 MED ORDER — ALUMINUM-MAGNESIUM-SIMETHICONE 200-200-20 MG/5ML PO SUSP: 30.0000 mL | Freq: Three times a day (TID) | ORAL | 0 refills | Status: AC

## 2017-06-15 MED ORDER — METOCLOPRAMIDE HCL 10 MG PO TABS
10.0000 mg | ORAL_TABLET | Freq: Once | ORAL | Status: AC
  Administered 2017-06-15: 10 mg via ORAL
  Filled 2017-06-15: qty 1

## 2017-06-15 MED ORDER — GI COCKTAIL ~~LOC~~
30.0000 mL | ORAL | Status: AC
  Administered 2017-06-15: 30 mL via ORAL
  Filled 2017-06-15: qty 30

## 2017-06-15 MED ORDER — FAMOTIDINE 20 MG PO TABS
40.0000 mg | ORAL_TABLET | Freq: Once | ORAL | Status: AC
  Administered 2017-06-15: 40 mg via ORAL
  Filled 2017-06-15: qty 2

## 2017-06-15 MED ORDER — KETOROLAC TROMETHAMINE 60 MG/2ML IM SOLN
15.0000 mg | Freq: Once | INTRAMUSCULAR | Status: AC
Start: 2017-06-15 — End: 2017-06-15
  Administered 2017-06-15: 15 mg via INTRAMUSCULAR
  Filled 2017-06-15: qty 2

## 2017-06-15 MED ORDER — CEPHALEXIN 500 MG PO CAPS: 500.0000 mg | ORAL_CAPSULE | Freq: Three times a day (TID) | ORAL | 0 refills | Status: DC

## 2017-06-15 NOTE — ED Notes (Signed)
Told pt that she is being dc home.  She asks "are you not going to do anything for my boob?"  Explained to pt that she is going home with an abx and follow up with MD in a week

## 2017-06-15 NOTE — Discharge Instructions (Signed)
Take keflex for your breast inflammation and follow up with general surgery for further evaluation.   Your labs today did not show any acute issues.  Follow up with primary care and gastroenterology regarding your chronic abdominal pain. Do not drink alcohol at all.

## 2017-06-15 NOTE — ED Provider Notes (Signed)
[facility] Emergency Department Provider Note  ____________________________________________  Time seen: Approximately 5:07 PM  I have reviewed the triage vital signs and the nursing notes.   HISTORY  Chief Complaint Abdominal Pain    HPI Susan [name] is a 46 y.o. female who complains of right breast pain and swelling and redness that has been on and off for the past 10 years. She reports multiple surgeries and abscess drainages. He is to be worse again. No fevers chills or sweats.  Also complains of an otherwise abdominal pain. This is a chronic issue for her as well. She is recently hospitalized had an MRCP which was unremarkable. 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 She reports that after her recent hospitalization where she was in the ICU for delirium tremens, she is drinking again. Pain is nonradiating, no aggravating or alleviating factors. Moderate intensity. No black or bloody stool. She does complain of some anal pain worse with bowel movements.     Past Medical History:  Diagnosis Date  . Allergy   . Anemia   . Anxiety   . Asthma   . Cirrhosis (HCC)   . COPD (chronic obstructive pulmonary disease) (HCC) 12/09/2014  . Depression   . Emphysema of lung (HCC)   . GERD (gastroesophageal reflux disease)   . Heart murmur   . Hepatitis C    pt said they told her she had this last visit here  . Hyperlipidemia   . Hypertension      Patient Active Problem List   Diagnosis Date Noted  . Biliary obstruction   . Delirium tremens (HCC)   . Alcohol withdrawal syndrome, with delirium (HCC)   . Acute abdominal pain 04/28/2017  . Elevated LFTs 04/02/2015  . Hepatic cirrhosis (HCC) 12/12/2014  . Diarrhea 12/09/2014  . Blood in stool 12/09/2014  . Abdominal pain 12/09/2014  . COPD (chronic obstructive pulmonary disease) (HCC) 12/09/2014     Past Surgical History:  Procedure Laterality Date  . CESAREAN SECTION  2009      Prior to Admission medications   Medication Sig Start Date End Date Taking? Authorizing Provider  albuterol (PROVENTIL HFA;VENTOLIN HFA) 108 (90 Base) MCG/ACT inhaler Inhale 1 puff into the lungs every 4 (four) hours as needed for wheezing or shortness of breath. 05/04/17   Auburn Bilberry, MD  aluminum-magnesium hydroxide-simethicone (MAALOX) 200-200-20 MG/5ML SUSP Take 30 mLs 4 (four) times daily -  before meals and at bedtime by mouth. 06/15/17   Sharman Cheek, MD  budesonide-formoterol [facility]) 80-4.5 MCG/ACT inhaler Inhale 2 puffs into the lungs 2 (two) times daily. 05/04/17   Auburn Bilberry, MD  cephALEXin (KEFLEX) 500 MG capsule Take 1 capsule (500 mg total) 3 (three) times daily by mouth. 06/15/17   Sharman Cheek, MD  clonazePAM (KLONOPIN) 1 MG tablet Take 1 tablet (1 mg total) by mouth 2 (two) times daily. 05/04/17   Auburn Bilberry, MD  escitalopram (LEXAPRO) 20 MG tablet Take 1 tablet (20 mg total) by mouth at bedtime. 05/04/17   Auburn Bilberry, MD  famotidine (PEPCID) 20 MG tablet Take 1 tablet (20 mg total) 2 (two) times daily by mouth. 06/15/17   Sharman Cheek, MD  furosemide (LASIX) 40 MG tablet Take 1 tablet (40 mg total) by mouth 2 (two) times daily. 05/04/17   Auburn Bilberry, MD  hydrOXYzine (ATARAX/VISTARIL) 25 MG tablet Take 1 tablet (25 mg total) by mouth 3 (three) times daily as needed (itching). Patient not taking: Reported on 01/04/2017 04/07/15  Sudini, Srikar, MD  ipratropium-albuterol (DUONEB) 0.5-2.5 (3) MG/3ML SOLN Take 3 mLs by nebulization every 6 (six) hours as needed (for shortness of breath/wheezing).     [provider]  loratadine (CLARITIN) 10 MG tablet Take 1 tablet (10 mg total) by mouth daily. 05/04/17   Auburn BilberryPatel, Shreyang, MD  metoCLOPramide (REGLAN) 10 MG tablet Take 1 tablet (10 mg total) every 6 (six) hours as needed by mouth. 06/15/17   Sharman CheekStafford, Dalesha Stanback, MD  omeprazole (PRILOSEC) 40 MG capsule Take 1 capsule (40 mg total) by mouth  daily. 05/04/17   Auburn BilberryPatel, Shreyang, MD  Oxycodone HCl 10 MG TABS Take 1 tablet (10 mg total) by mouth every 6 (six) hours as needed. 05/04/17   Auburn BilberryPatel, Shreyang, MD  Potassium Chloride ER 20 MEQ TBCR Take 40 mEq by mouth daily. 05/04/17   Auburn BilberryPatel, Shreyang, MD  spironolactone (ALDACTONE) 100 MG tablet Take 1 tablet (100 mg total) by mouth daily. 05/04/17   Auburn BilberryPatel, Shreyang, MD     Allergies Doxycycline; Latex; Promethazine; Ranitidine; and Zofran [ondansetron hcl]   Family History  Problem Relation Age of Onset  . Other Mother   . Lung cancer Mother   . Skin cancer Father     Social History Social History   Tobacco Use  . Smoking status: Current Every Day Smoker    Packs/day: 1.00    Years: 20.00    Pack years: 20.00    Types: Cigarettes  . Smokeless tobacco: Never Used  Substance Use Topics  . Alcohol use: No    Comment: Quit drinking few years ago.  . Drug use: No    Review of Systems  Constitutional:   No fever or chills.  ENT:   No sore throat. No rhinorrhea. Cardiovascular:   No chest pain or syncope. Respiratory:   No dyspnea or cough. Gastrointestinal:   chronic abdominal pain as above without vomiting and diarrhea.  Musculoskeletal:   Negative for focal pain or swelling All other systems reviewed and are negative except as documented above in ROS and HPI.  ____________________________________________   PHYSICAL EXAM:  VITAL SIGNS: ED Triage Vitals  Enc Vitals Group     BP 06/15/17 1135 122/79     Pulse Rate 06/15/17 1135 (!) 110     Resp 06/15/17 1135 20     Temp 06/15/17 1135 97.7 F (36.5 C)     Temp Source 06/15/17 1135 Oral     SpO2 06/15/17 1135 97 %     Weight 06/15/17 1131 160 lb (72.6 kg)     Height 06/15/17 1131 5\' 3"  (1.6 m)     Head Circumference --      Peak Flow --      Pain Score 06/15/17 1131 10     Pain Loc --      Pain Edu? --      Excl. in GC? --     Vital signs reviewed, nursing assessments reviewed. examined with nurse Elease HashimotoAlisha at  bedside  Constitutional:   Alert and oriented. Well appearing and in no distress. Eyes:   No scleral icterus.  EOMI. No nystagmus. No conjunctival pallor. PERRL. ENT   Head:   Normocephalic and atraumatic.   Nose:   No congestion/rhinnorhea.    Mouth/Throat:   MMM, no pharyngeal erythema. No peritonsillar mass.    Neck:   No meningismus. Full ROM. Hematological/Lymphatic/Immunilogical:   No cervical lymphadenopathy. Cardiovascular:   RRR. Symmetric bilateral radial and DP pulses.  No murmurs.  Respiratory:   Normal  respiratory effort without tachypnea/retractions. Breath sounds are clear and equal bilaterally. No wheezes/rales/rhonchi. Gastrointestinal:   Soft and nontender. Non distended. There is no CVA tenderness.  No rebound, rigidity, or guarding.rectal exam reveals severe pain  with digital exam of the anal sphincter. There is a small fissure. There is a small external hemorrhoid with that is not thrombosed bleeding or inflamed. Rectal exam unremarkable. Hemoccult negative, control is okay. Brown stool. Genitourinary:   deferred Musculoskeletal:   Normal range of motion in all extremities. No joint effusions.  No lower extremity tenderness.  No edema.left breast is normal. Right breast has an area of induration and erythema at 3:00 position with some swelling of the areola as well. No fluctuance or streaking. Neurologic:   Normal speech and language.  Motor grossly intact. No gross focal neurologic deficits are appreciated.  Skin:    Skin is warm, dry and intact. No rash noted.  No petechiae, purpura, or bullae.  ____________________________________________    LABS (pertinent positives/negatives) (all labs ordered are listed, but only abnormal results are displayed) Labs Reviewed  COMPREHENSIVE METABOLIC PANEL - Abnormal; Notable for the following components:      Result Value   BUN <5 (*)    Calcium 8.6 (*)    Total Protein 8.6 (*)    AST 225 (*)    ALT 109 (*)     All other components within normal limits  CBC - Abnormal; Notable for the following components:   MCV 100.8 (*)    MCH 34.1 (*)    Platelets 113 (*)    All other components within normal limits  URINALYSIS, COMPLETE (UACMP) WITH MICROSCOPIC - Abnormal; Notable for the following components:   Color, Urine YELLOW (*)    APPearance CLEAR (*)    Squamous Epithelial / LPF 0-5 (*)    All other components within normal limits  LIPASE, BLOOD  POC URINE PREG, ED  POCT PREGNANCY, URINE   ____________________________________________   EKG   interpreted by me Sinus tachycardia rate 104, normal axis intervals QRS ST segments and T waves ____________________________________________    RADIOLOGY  No results found.  ____________________________________________   PROCEDURES Procedures  ____________________________________________   DIFFERENTIAL DIAGNOSIS  mastitis, pancreatitis, gastritis, hepatitis, chronic pain, anal fissure  CLINICAL IMPRESSION / ASSESSMENT AND PLAN / ED COURSE  Pertinent labs & imaging results that were available during my care of the patient were reviewed by me and considered in my medical decision making (see chart for details).   patient presents with multiple complaints. Anal pain and exam are highly consistent with an anal fissure. Her abdominal pain appears to be chronic and unrelated. Labs are unremarkable. 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 Labs are reassuring.Considering the patient's symptoms, medical history, and physical examination today, I have low suspicion for cholecystitis or biliary pathology, pancreatitis, perforation or bowel obstruction, hernia, intra-abdominal abscess, AAA or dissection, volvulus or intussusception, mesenteric ischemia, or appendicitis.  She does appear to have mastitis which is acute on chronic. I'll have her take Keflex, follow-up with general surgery.  Follow-up with gastroenterology for further evaluation of her abdominal pain.       ____________________________________________   FINAL CLINICAL IMPRESSION(S) / ED DIAGNOSES    Final diagnoses:  Generalized abdominal pain  Anal fissure  Mastitis chronic, right      This SmartLink is deprecated. Use AVSMEDLIST instead to display the medication list for a patient.   Portions of this note were  generated with Scientist, clinical (histocompatibility and immunogenetics). Dictation errors may occur despite best attempts at proofreading.    Sharman Cheek, MD 06/15/17 [phone]

## 2017-06-15 NOTE — ED Triage Notes (Signed)
Pt c/o right breast pain for 6 months.  Has had lumps and swelling here.  C/o anal pain as well. Pt denies injury to anal area.  Would also like to be seen for generalized abdominal pain and change in color to urine.  Mostly c/o upper abdominal pain.  NAD. Appears anxious in triage.

## 2017-10-29 ENCOUNTER — Other Ambulatory Visit: Payer: Self-pay

## 2017-10-29 ENCOUNTER — Emergency Department
Admission: EM | Admit: 2017-10-29 | Discharge: 2017-10-29 | Disposition: A | Discharge status: Home / Self Care | Payer: Medicaid Other | Attending: Emergency Medicine | Admitting: Emergency Medicine

## 2017-10-29 DIAGNOSIS — Z79899 Other long term (current) drug therapy: Secondary | ICD-10-CM | POA: Insufficient documentation

## 2017-10-29 DIAGNOSIS — I10 Essential (primary) hypertension: Secondary | ICD-10-CM | POA: Diagnosis not present

## 2017-10-29 DIAGNOSIS — R109 Unspecified abdominal pain: Secondary | ICD-10-CM | POA: Diagnosis present

## 2017-10-29 DIAGNOSIS — K922 Gastrointestinal hemorrhage, unspecified: Secondary | ICD-10-CM | POA: Diagnosis not present

## 2017-10-29 DIAGNOSIS — K921 Melena: Secondary | ICD-10-CM | POA: Insufficient documentation

## 2017-10-29 DIAGNOSIS — J449 Chronic obstructive pulmonary disease, unspecified: Secondary | ICD-10-CM | POA: Diagnosis not present

## 2017-10-29 DIAGNOSIS — J45909 Unspecified asthma, uncomplicated: Secondary | ICD-10-CM | POA: Diagnosis not present

## 2017-10-29 DIAGNOSIS — F17210 Nicotine dependence, cigarettes, uncomplicated: Secondary | ICD-10-CM | POA: Insufficient documentation

## 2017-10-29 LAB — COMPREHENSIVE METABOLIC PANEL
ALT: 130 U/L — ABNORMAL HIGH (ref 14–54)
AST: 329 U/L — ABNORMAL HIGH (ref 15–41)
Albumin: 3.8 g/dL (ref 3.5–5.0)
Alkaline Phosphatase: 129 U/L — ABNORMAL HIGH (ref 38–126)
Anion gap: 12 (ref 5–15)
BUN: 6 mg/dL (ref 6–20)
CHLORIDE: 103 mmol/L (ref 101–111)
CO2: 22 mmol/L (ref 22–32)
Calcium: 8.7 mg/dL — ABNORMAL LOW (ref 8.9–10.3)
Creatinine, Ser: 0.42 mg/dL — ABNORMAL LOW (ref 0.44–1.00)
GFR calc non Af Amer: >60 mL/min (ref >60)
Glucose, Bld: 112 mg/dL — ABNORMAL HIGH (ref 65–99)
Potassium: 3.9 mmol/L (ref 3.5–5.1)
SODIUM: 137 mmol/L (ref 135–145)
Total Bilirubin: 1.4 mg/dL — ABNORMAL HIGH (ref 0.3–1.2)
Total Protein: 8.9 g/dL — ABNORMAL HIGH (ref 6.5–8.1)

## 2017-10-29 LAB — CBC
HCT: 40.2 % (ref 35.0–47.0)
HEMOGLOBIN: 13.7 g/dL (ref 12.0–16.0)
MCH: 33.8 pg (ref 26.0–34.0)
MCHC: 34 g/dL (ref 32.0–36.0)
MCV: 99.4 fL (ref 80.0–100.0)
Platelets: 78 10*3/uL — ABNORMAL LOW (ref 150–440)
RBC: 4.04 MIL/uL (ref 3.80–5.20)
RDW: 13.9 % (ref 11.5–14.5)
WBC: 6.1 10*3/uL (ref 3.6–11.0)

## 2017-10-29 LAB — HEMOGLOBIN AND HEMATOCRIT, BLOOD
HCT: 37.9 % (ref 35.0–47.0)
HEMOGLOBIN: 13 g/dL (ref 12.0–16.0)

## 2017-10-29 MED ORDER — NICOTINE 21 MG/24HR TD PT24
21.0000 mg | MEDICATED_PATCH | Freq: Once | TRANSDERMAL | Status: DC
  Administered 2017-10-29: 21 mg via TRANSDERMAL

## 2017-10-29 MED ORDER — NICOTINE 21 MG/24HR TD PT24
MEDICATED_PATCH | TRANSDERMAL | Status: AC
  Filled 2017-10-29: qty 1

## 2017-10-29 MED ORDER — PANTOPRAZOLE SODIUM 40 MG PO TBEC: 40.0000 mg | DELAYED_RELEASE_TABLET | Freq: Every day | ORAL | 0 refills | Status: DC

## 2017-10-29 MED ORDER — MORPHINE SULFATE (PF) 4 MG/ML IV SOLN
4.0000 mg | Freq: Once | INTRAVENOUS | Status: AC
  Administered 2017-10-29: 4 mg via INTRAVENOUS
  Filled 2017-10-29: qty 1

## 2017-10-29 NOTE — ED Notes (Signed)
Pt complains of generalized abd pain and dark tarry liquid to semi formed stools for 2 days. Pt denies known fever. Pt has skin that appears to bruise easily. Pt with history of etoh abuse in past.

## 2017-10-29 NOTE — ED Notes (Signed)
Report to kaleigh, rn.  

## 2017-10-29 NOTE — ED Provider Notes (Signed)
[facility] Emergency Department Provider Note _______________   First MD Initiated Contact with Patient 10/29/17 0401     (approximate)  I have reviewed the triage vital signs and the nursing notes.   HISTORY  Chief Complaint Abdominal Pain and Melena    HPI Susan [name] is a 47 y.o. female with below list of chronic medical conditions including cirrhosis of the liver and hemorrhoids presents to the emergency department with dark tarry stools noted today.  Patient states that she has had previous episodes of bright red blood per rectum secondary to hemorrhoids however has never noticed dark stools.  Patient denies any chest pain.  Patient denies any dizziness palpitations nausea vomiting.  Patient states that she is also ran out of her clonazepam and is requesting something for anxiety at this time.   Past Medical History:  Diagnosis Date  . Allergy   . Anemia   . Anxiety   . Asthma   . Cirrhosis (HCC)   . COPD (chronic obstructive pulmonary disease) (HCC) 12/09/2014  . Depression   . Emphysema of lung (HCC)   . GERD (gastroesophageal reflux disease)   . Heart murmur   . Hepatitis C    pt said they told her she had this last visit here  . Hyperlipidemia   . Hypertension     Patient Active Problem List   Diagnosis Date Noted  . Biliary obstruction   . Delirium tremens (HCC)   . Alcohol withdrawal syndrome, with delirium (HCC)   . Acute abdominal pain 04/28/2017  . Elevated LFTs 04/02/2015  . Hepatic cirrhosis (HCC) 12/12/2014  . Diarrhea 12/09/2014  . Blood in stool 12/09/2014  . Abdominal pain 12/09/2014  . COPD (chronic obstructive pulmonary disease) (HCC) 12/09/2014    Past Surgical History:  Procedure Laterality Date  . CESAREAN SECTION  2009    Prior to Admission medications   Medication Sig Start Date End Date Taking? Authorizing Provider  albuterol (PROVENTIL HFA;VENTOLIN HFA) 108 (90 Base) MCG/ACT inhaler Inhale 1 puff into  the lungs every 4 (four) hours as needed for wheezing or shortness of breath. 05/04/17   Auburn Bilberry, MD  aluminum-magnesium hydroxide-simethicone (MAALOX) 200-200-20 MG/5ML SUSP Take 30 mLs 4 (four) times daily -  before meals and at bedtime by mouth. 06/15/17   Sharman Cheek, MD  budesonide-formoterol [facility]) 80-4.5 MCG/ACT inhaler Inhale 2 puffs into the lungs 2 (two) times daily. 05/04/17   Auburn Bilberry, MD  cephALEXin (KEFLEX) 500 MG capsule Take 1 capsule (500 mg total) 3 (three) times daily by mouth. 06/15/17   Sharman Cheek, MD  clonazePAM (KLONOPIN) 1 MG tablet Take 1 tablet (1 mg total) by mouth 2 (two) times daily. 05/04/17   Auburn Bilberry, MD  escitalopram (LEXAPRO) 20 MG tablet Take 1 tablet (20 mg total) by mouth at bedtime. 05/04/17   Auburn Bilberry, MD  famotidine (PEPCID) 20 MG tablet Take 1 tablet (20 mg total) 2 (two) times daily by mouth. 06/15/17   Sharman Cheek, MD  furosemide (LASIX) 40 MG tablet Take 1 tablet (40 mg total) by mouth 2 (two) times daily. 05/04/17   Auburn Bilberry, MD  hydrOXYzine (ATARAX/VISTARIL) 25 MG tablet Take 1 tablet (25 mg total) by mouth 3 (three) times daily as needed (itching). Patient not taking: Reported on 01/04/2017 04/07/15   Milagros Loll, MD  ipratropium-albuterol (DUONEB) 0.5-2.5 (3) MG/3ML SOLN Take 3 mLs by nebulization every 6 (six) hours as needed (for shortness of breath/wheezing).  [provider]  loratadine (CLARITIN) 10 MG tablet Take 1 tablet (10 mg total) by mouth daily. 05/04/17   Auburn BilberryPatel, Shreyang, MD  metoCLOPramide (REGLAN) 10 MG tablet Take 1 tablet (10 mg total) every 6 (six) hours as needed by mouth. 06/15/17   Sharman CheekStafford, Phillip, MD  omeprazole (PRILOSEC) 40 MG capsule Take 1 capsule (40 mg total) by mouth daily. 05/04/17   Auburn BilberryPatel, Shreyang, MD  Oxycodone HCl 10 MG TABS Take 1 tablet (10 mg total) by mouth every 6 (six) hours as needed. 05/04/17   Auburn BilberryPatel, Shreyang, MD  pantoprazole (PROTONIX) 40 MG tablet  Take 1 tablet (40 mg total) by mouth daily. 10/29/17 11/28/17  Darci CurrentBrown, [place] N, MD  Potassium Chloride ER 20 MEQ TBCR Take 40 mEq by mouth daily. 05/04/17   Auburn BilberryPatel, Shreyang, MD  spironolactone (ALDACTONE) 100 MG tablet Take 1 tablet (100 mg total) by mouth daily. 05/04/17   Auburn BilberryPatel, Shreyang, MD    Allergies Doxycycline; Latex; Promethazine; Ranitidine; and Zofran [ondansetron hcl]  Family History  Problem Relation Age of Onset  . Other Mother   . Lung cancer Mother   . Skin cancer Father     Social History Social History   Tobacco Use  . Smoking status: Current Every Day Smoker    Packs/day: 1.00    Years: 20.00    Pack years: 20.00    Types: Cigarettes  . Smokeless tobacco: Never Used  Substance Use Topics  . Alcohol use: No    Comment: Quit drinking few years ago.  . Drug use: No    Review of Systems Constitutional: No fever/chills Eyes: No visual changes. ENT: No sore throat. Cardiovascular: Denies chest pain. Respiratory: Denies shortness of breath. Gastrointestinal: No abdominal pain.  No nausea, no vomiting.  No diarrhea.  No constipation. Genitourinary: Negative for dysuria.  Positive for dark stool with bright red blood as well Musculoskeletal: Negative for neck pain.  Negative for back pain. Integumentary: Negative for rash. Neurological: Negative for headaches, focal weakness or numbness.   ____________________________________________   PHYSICAL EXAM:  VITAL SIGNS: ED Triage Vitals  Enc Vitals Group     BP 10/29/17 0106 (!) 143/81     Pulse Rate 10/29/17 0106 (!) 106     Resp 10/29/17 0106 18     Temp 10/29/17 0106 99.2 F (37.3 C)     Temp Source 10/29/17 0106 Oral     SpO2 10/29/17 0106 97 %     Weight 10/29/17 0055 72.6 kg (160 lb)     Height 10/29/17 0055 1.6 m (5\' 3" )     Head Circumference --      Peak Flow --      Pain Score 10/29/17 0055 10     Pain Loc --      Pain Edu? --      Excl. in GC? --     Constitutional: Alert and oriented.  Well appearing and in no acute distress. Eyes: Conjunctivae are normal.  Head: Atraumatic. Mouth/Throat: Mucous membranes are moist.  Oropharynx non-erythematous. Neck: No stridor.   Cardiovascular: Normal rate, regular rhythm. Good peripheral circulation. Grossly normal heart sounds. Respiratory: Normal respiratory effort.  No retractions. Lungs CTAB. Gastrointestinal: Soft and nontender. No distention.  Guaiac positive Musculoskeletal: No lower extremity tenderness nor edema. No gross deformities of extremities. Neurologic:  Normal speech and language. No gross focal neurologic deficits are appreciated.  Skin:  Skin is warm, dry and intact. No rash noted. Psychiatric: Mood and affect are normal. Speech and  behavior are normal.  ____________________________________________   LABS (all labs ordered are listed, but only abnormal results are displayed)  Labs Reviewed  COMPREHENSIVE METABOLIC PANEL - Abnormal; Notable for the following components:      Result Value   Glucose, Bld 112 (*)    Creatinine, Ser 0.42 (*)    Calcium 8.7 (*)    Total Protein 8.9 (*)    AST 329 (*)    ALT 130 (*)    Alkaline Phosphatase 129 (*)    Total Bilirubin 1.4 (*)    All other components within normal limits  CBC - Abnormal; Notable for the following components:   Platelets 78 (*)    All other components within normal limits  HEMOGLOBIN AND HEMATOCRIT, BLOOD   ____________________________________________  \  Procedures   ____________________________________________   INITIAL IMPRESSION / ASSESSMENT AND PLAN / ED COURSE  As part of my medical decision making, I reviewed the following data within the electronic MEDICAL RECORD NUMBER   47 year old female presents to the emergency department above-stated history and physical exam secondary to dark tarry stools.  Patient with history of cirrhosis I suspect this to be secondary to upper GI bleed due to chronic alcohol abuse.  Patient's initial  hemoglobin 13.7 repeat hemoglobin 3 3-1/2 hours later 13 with stable vital signs.  Patient given Protonix in the emergency department spoke at length with the patient regarding detox for alcohol however the patient decided to forego with at this time.  Regarding patient's request for clonazepam for chronic anxiety told the patient she would need to follow-up with a primary care provider for such. ____________________________________________  FINAL CLINICAL IMPRESSION(S) / ED DIAGNOSES  Final diagnoses:  Gastrointestinal hemorrhage, unspecified gastrointestinal hemorrhage type     MEDICATIONS GIVEN DURING THIS VISIT:  Medications  morphine 4 MG/ML injection 4 mg (4 mg Intravenous Given 10/29/17 0422)     ED Discharge Orders        Ordered    pantoprazole (PROTONIX) 40 MG tablet  Daily     10/29/17 0714       Note:  This document was prepared using Dragon voice recognition software and may include unintentional dictation errors.    Darci Current, MD 10/29/17 2240

## 2017-10-29 NOTE — ED Triage Notes (Signed)
Patient reports noticed dark black stool on Sunday.  Continued dark stool and now with abdominal pain.

## 2017-11-08 ENCOUNTER — Other Ambulatory Visit: Payer: Self-pay | Admitting: Nurse Practitioner

## 2017-11-08 DIAGNOSIS — R21 Rash and other nonspecific skin eruption: Secondary | ICD-10-CM

## 2017-11-08 DIAGNOSIS — N6452 Nipple discharge: Secondary | ICD-10-CM

## 2017-11-28 ENCOUNTER — Other Ambulatory Visit: Payer: Medicaid Other

## 2017-11-29 ENCOUNTER — Telehealth: Payer: Self-pay | Admitting: Obstetrics & Gynecology

## 2017-11-29 ENCOUNTER — Ambulatory Visit
Admission: RE | Admit: 2017-11-29 | Discharge: 2017-11-29 | Disposition: A | Discharge status: Home / Self Care | Payer: Medicaid Other | Source: Ambulatory Visit | Attending: Nurse Practitioner | Admitting: Nurse Practitioner

## 2017-11-29 DIAGNOSIS — N6452 Nipple discharge: Secondary | ICD-10-CM | POA: Insufficient documentation

## 2017-11-29 DIAGNOSIS — R21 Rash and other nonspecific skin eruption: Secondary | ICD-10-CM | POA: Diagnosis present

## 2017-11-29 DIAGNOSIS — R234 Changes in skin texture: Secondary | ICD-10-CM | POA: Diagnosis not present

## 2017-11-29 NOTE — Telephone Encounter (Signed)
[facility] referring for abnormal pap smear. Called and left voicemail for patient to call back to be schedule.

## 2017-11-30 LAB — HIV ANTIBODY (ROUTINE TESTING W REFLEX): HIV SCREEN 4TH GENERATION: NONREACTIVE

## 2017-11-30 NOTE — Telephone Encounter (Signed)
Called and left voice mail for patient to call back to be schedule °

## 2017-12-14 ENCOUNTER — Encounter: Payer: Self-pay | Admitting: Obstetrics and Gynecology

## 2017-12-14 ENCOUNTER — Ambulatory Visit (INDEPENDENT_AMBULATORY_CARE_PROVIDER_SITE_OTHER): Payer: Medicaid Other | Admitting: Obstetrics and Gynecology

## 2017-12-14 VITALS — BP 120/80 | Ht 63.5 in | Wt 171.0 lb

## 2017-12-14 DIAGNOSIS — R87610 Atypical squamous cells of undetermined significance on cytologic smear of cervix (ASC-US): Secondary | ICD-10-CM | POA: Insufficient documentation

## 2017-12-14 NOTE — Progress Notes (Signed)
Patient ID: Susan [name], female   DOB: [birth year], 47 y.o.   MRN: [id number]  Reason for Consult: Colposcopy   Referred by [name], MD  Subjective:     HPI:  Susan [name] is a 48 y.o. female she was referred to the office today for colposcopy.  Past Medical History:  Diagnosis Date  . Allergy   . Anemia   . Anxiety   . Asthma   . Cirrhosis (HCC)   . COPD (chronic obstructive pulmonary disease) (HCC) 12/09/2014  . Depression   . Emphysema of lung (HCC)   . GERD (gastroesophageal reflux disease)   . Heart murmur   . Hepatitis C    pt said they told her she had this last visit here  . Hyperlipidemia   . Hypertension    Family History  Problem Relation Age of Onset  . Other Mother   . Lung cancer Mother   . Skin cancer Father   . Breast cancer Maternal Grandmother 8   Past Surgical History:  Procedure Laterality Date  . BREAST CYST ASPIRATION Right 12/2017   abscess aspiration  . CESAREAN SECTION  2009    Short Social History:  Social History   Tobacco Use  . Smoking status: Current Every Day Smoker    Packs/day: 1.00    Years: 20.00    Pack years: 20.00    Types: Cigarettes  . Smokeless tobacco: Never Used  Substance Use Topics  . Alcohol use: No    Comment: Quit drinking few years ago.    Allergies  Allergen Reactions  . Doxycycline Hives  . Latex Hives  . Promethazine Diarrhea  . Ranitidine Nausea Only  . Zofran [Ondansetron Hcl] Itching    Current Outpatient Medications  Medication Sig Dispense Refill  . albuterol (PROVENTIL HFA;VENTOLIN HFA) 108 (90 Base) MCG/ACT inhaler Inhale 1 puff into the lungs every 4 (four) hours as needed for wheezing or shortness of breath. 1 Inhaler 0  . budesonide-formoterol (SYMBICORT) 80-4.5 MCG/ACT inhaler Inhale 2 puffs into the lungs 2 (two) times daily. 1 Inhaler 12  . ciprofloxacin (CIPRO) 250 MG tablet Take by mouth.    . clonazePAM (KLONOPIN) 1 MG tablet Take 1 tablet (1 mg total) by mouth  2 (two) times daily. 30 tablet 0  . escitalopram (LEXAPRO) 20 MG tablet Take 1 tablet (20 mg total) by mouth at bedtime. 30 tablet 0  . furosemide (LASIX) 40 MG tablet Take 1 tablet (40 mg total) by mouth 2 (two) times daily. 60 tablet 0  . hydrOXYzine (ATARAX/VISTARIL) 25 MG tablet Take 1 tablet (25 mg total) by mouth 3 (three) times daily as needed (itching). 30 tablet 0  . loratadine (CLARITIN) 10 MG tablet Take 1 tablet (10 mg total) by mouth daily. 30 tablet 0  . omeprazole (PRILOSEC) 40 MG capsule Take 1 capsule (40 mg total) by mouth daily. 30 capsule 0  . Potassium Chloride ER 20 MEQ TBCR Take 40 mEq by mouth daily. 60 tablet 1  . aluminum-magnesium hydroxide-simethicone (MAALOX) 200-200-20 MG/5ML SUSP Take 30 mLs 4 (four) times daily -  before meals and at bedtime by mouth. 355 mL 0  . cephALEXin (KEFLEX) 500 MG capsule Take 1 capsule (500 mg total) 3 (three) times daily by mouth. 21 capsule 0  . famotidine (PEPCID) 20 MG tablet Take 1 tablet (20 mg total) 2 (two) times daily by mouth. 60 tablet 0  . ipratropium-albuterol (DUONEB) 0.5-2.5 (3) MG/3ML SOLN Take 3 mLs by  nebulization every 6 (six) hours as needed (for shortness of breath/wheezing).     . metoCLOPramide (REGLAN) 10 MG tablet Take 1 tablet (10 mg total) every 6 (six) hours as needed by mouth. 30 tablet 0  . Oxycodone HCl 10 MG TABS Take 1 tablet (10 mg total) by mouth every 6 (six) hours as needed. 30 tablet 0  . pantoprazole (PROTONIX) 40 MG tablet Take 1 tablet (40 mg total) by mouth daily. 30 tablet 0  . spironolactone (ALDACTONE) 100 MG tablet Take 1 tablet (100 mg total) by mouth daily. 30 tablet 0   No current facility-administered medications for this visit.     Review of Systems  Constitutional: Negative for chills, fatigue, fever and unexpected weight change.  HENT: Negative for trouble swallowing.  Eyes: Negative for loss of vision.  Respiratory: Negative for cough, shortness of breath and wheezing.    Cardiovascular: Negative for chest pain, leg swelling, palpitations and syncope.  GI: Negative for abdominal pain, blood in stool, diarrhea, nausea and vomiting.  GU: Negative for difficulty urinating, dysuria, frequency and hematuria.  Musculoskeletal: Negative for back pain, leg pain and joint pain.  Skin: Negative for rash.  Neurological: Negative for dizziness, headaches, light-headedness, numbness and seizures.  Psychiatric: Negative for behavioral problem, confusion, depressed mood and sleep disturbance.        Objective:  Objective   Vitals:   12/14/17 1502  BP: 120/80  Weight: 171 lb (77.6 kg)  Height: 5' 3.5" (1.613 m)   Body mass index is 29.82 kg/m.  Physical Exam  Constitutional: She is oriented to person, place, and time. She appears well-developed and well-nourished.  HENT:  Head: Normocephalic and atraumatic.  Eyes: EOM are normal.  Cardiovascular: Normal rate and regular rhythm.  Pulmonary/Chest: Effort normal and breath sounds normal.  Neurological: She is alert and oriented to person, place, and time.  Skin: Skin is warm and dry.  Psychiatric: She has a normal mood and affect. Her behavior is normal. Judgment and thought content normal.  Nursing note and vitals reviewed.   Data: Medical records reviewed     Assessment/Plan:    Pap smear was ASCUS HPV negative on 11/13/17, Blondine does not need colposcopy today. Plan will be for repeat pap smear in 3 years per the ASCCP guidelines. Please see image below for algortihm  Adelene Idler MD [facility], [facility] 12/14/17 3:52 PM

## 2017-12-18 ENCOUNTER — Ambulatory Visit: Payer: Self-pay | Admitting: Obstetrics and Gynecology

## 2018-03-09 ENCOUNTER — Other Ambulatory Visit: Payer: Self-pay

## 2018-03-09 ENCOUNTER — Inpatient Hospital Stay
Admission: EM | Admit: 2018-03-09 | Discharge: 2018-03-15 | DRG: 372 | Disposition: A | Discharge status: Home / Self Care | Payer: Medicaid Other | Attending: Internal Medicine | Admitting: Internal Medicine

## 2018-03-09 DIAGNOSIS — R739 Hyperglycemia, unspecified: Secondary | ICD-10-CM | POA: Diagnosis present

## 2018-03-09 DIAGNOSIS — K838 Other specified diseases of biliary tract: Secondary | ICD-10-CM | POA: Diagnosis present

## 2018-03-09 DIAGNOSIS — K7460 Unspecified cirrhosis of liver: Secondary | ICD-10-CM | POA: Diagnosis present

## 2018-03-09 DIAGNOSIS — A045 Campylobacter enteritis: Principal | ICD-10-CM | POA: Diagnosis present

## 2018-03-09 DIAGNOSIS — R945 Abnormal results of liver function studies: Secondary | ICD-10-CM

## 2018-03-09 DIAGNOSIS — D6959 Other secondary thrombocytopenia: Secondary | ICD-10-CM | POA: Diagnosis present

## 2018-03-09 DIAGNOSIS — K644 Residual hemorrhoidal skin tags: Secondary | ICD-10-CM | POA: Diagnosis present

## 2018-03-09 DIAGNOSIS — J439 Emphysema, unspecified: Secondary | ICD-10-CM | POA: Diagnosis present

## 2018-03-09 DIAGNOSIS — Z803 Family history of malignant neoplasm of breast: Secondary | ICD-10-CM

## 2018-03-09 DIAGNOSIS — E871 Hypo-osmolality and hyponatremia: Secondary | ICD-10-CM | POA: Diagnosis present

## 2018-03-09 DIAGNOSIS — D649 Anemia, unspecified: Secondary | ICD-10-CM | POA: Diagnosis not present

## 2018-03-09 DIAGNOSIS — F419 Anxiety disorder, unspecified: Secondary | ICD-10-CM | POA: Diagnosis present

## 2018-03-09 DIAGNOSIS — Z86010 Personal history of colonic polyps: Secondary | ICD-10-CM

## 2018-03-09 DIAGNOSIS — Z808 Family history of malignant neoplasm of other organs or systems: Secondary | ICD-10-CM

## 2018-03-09 DIAGNOSIS — K449 Diaphragmatic hernia without obstruction or gangrene: Secondary | ICD-10-CM | POA: Diagnosis present

## 2018-03-09 DIAGNOSIS — K8020 Calculus of gallbladder without cholecystitis without obstruction: Secondary | ICD-10-CM | POA: Diagnosis present

## 2018-03-09 DIAGNOSIS — Z91040 Latex allergy status: Secondary | ICD-10-CM

## 2018-03-09 DIAGNOSIS — E876 Hypokalemia: Secondary | ICD-10-CM | POA: Diagnosis present

## 2018-03-09 DIAGNOSIS — Z888 Allergy status to other drugs, medicaments and biological substances status: Secondary | ICD-10-CM

## 2018-03-09 DIAGNOSIS — F1010 Alcohol abuse, uncomplicated: Secondary | ICD-10-CM | POA: Diagnosis present

## 2018-03-09 DIAGNOSIS — I10 Essential (primary) hypertension: Secondary | ICD-10-CM | POA: Diagnosis present

## 2018-03-09 DIAGNOSIS — K219 Gastro-esophageal reflux disease without esophagitis: Secondary | ICD-10-CM | POA: Diagnosis present

## 2018-03-09 DIAGNOSIS — Z881 Allergy status to other antibiotic agents status: Secondary | ICD-10-CM

## 2018-03-09 DIAGNOSIS — B182 Chronic viral hepatitis C: Secondary | ICD-10-CM | POA: Diagnosis present

## 2018-03-09 DIAGNOSIS — A040 Enteropathogenic Escherichia coli infection: Secondary | ICD-10-CM | POA: Diagnosis present

## 2018-03-09 DIAGNOSIS — E860 Dehydration: Secondary | ICD-10-CM | POA: Diagnosis present

## 2018-03-09 DIAGNOSIS — Z7951 Long term (current) use of inhaled steroids: Secondary | ICD-10-CM

## 2018-03-09 DIAGNOSIS — R197 Diarrhea, unspecified: Secondary | ICD-10-CM | POA: Diagnosis present

## 2018-03-09 DIAGNOSIS — K921 Melena: Secondary | ICD-10-CM | POA: Diagnosis not present

## 2018-03-09 DIAGNOSIS — Z9119 Patient's noncompliance with other medical treatment and regimen: Secondary | ICD-10-CM

## 2018-03-09 DIAGNOSIS — R000 Tachycardia, unspecified: Secondary | ICD-10-CM | POA: Diagnosis present

## 2018-03-09 DIAGNOSIS — R7989 Other specified abnormal findings of blood chemistry: Secondary | ICD-10-CM

## 2018-03-09 DIAGNOSIS — R1011 Right upper quadrant pain: Secondary | ICD-10-CM

## 2018-03-09 DIAGNOSIS — F329 Major depressive disorder, single episode, unspecified: Secondary | ICD-10-CM | POA: Diagnosis present

## 2018-03-09 DIAGNOSIS — Z801 Family history of malignant neoplasm of trachea, bronchus and lung: Secondary | ICD-10-CM

## 2018-03-09 DIAGNOSIS — Z79899 Other long term (current) drug therapy: Secondary | ICD-10-CM

## 2018-03-09 DIAGNOSIS — F17210 Nicotine dependence, cigarettes, uncomplicated: Secondary | ICD-10-CM | POA: Diagnosis present

## 2018-03-09 DIAGNOSIS — E785 Hyperlipidemia, unspecified: Secondary | ICD-10-CM | POA: Diagnosis present

## 2018-03-09 LAB — URINALYSIS, COMPLETE (UACMP) WITH MICROSCOPIC
Bilirubin Urine: NEGATIVE
GLUCOSE, UA: NEGATIVE mg/dL
Ketones, ur: NEGATIVE mg/dL
Leukocytes, UA: NEGATIVE
NITRITE: NEGATIVE
PH: 6 (ref 5.0–8.0)
Protein, ur: 30 mg/dL — AB
SPECIFIC GRAVITY, URINE: 1.018 (ref 1.005–1.030)

## 2018-03-09 LAB — CBC
HCT: 43.2 % (ref 35.0–47.0)
Hemoglobin: 15 g/dL (ref 12.0–16.0)
MCH: 34 pg (ref 26.0–34.0)
MCHC: 34.6 g/dL (ref 32.0–36.0)
MCV: 98.3 fL (ref 80.0–100.0)
PLATELETS: 70 10*3/uL — AB (ref 150–440)
RBC: 4.4 MIL/uL (ref 3.80–5.20)
RDW: 14.2 % (ref 11.5–14.5)
WBC: 10.8 10*3/uL (ref 3.6–11.0)

## 2018-03-09 LAB — COMPREHENSIVE METABOLIC PANEL
ALBUMIN: 3.4 g/dL — AB (ref 3.5–5.0)
ALK PHOS: 168 U/L — AB (ref 38–126)
ALT: 51 U/L — AB (ref 0–44)
AST: 93 U/L — ABNORMAL HIGH (ref 15–41)
Anion gap: 10 (ref 5–15)
BUN: 12 mg/dL (ref 6–20)
CALCIUM: 9.5 mg/dL (ref 8.9–10.3)
CO2: 18 mmol/L — AB (ref 22–32)
CREATININE: 0.61 mg/dL (ref 0.44–1.00)
Chloride: 100 mmol/L (ref 98–111)
GFR calc Af Amer: >60 mL/min (ref >60)
GFR calc non Af Amer: >60 mL/min (ref >60)
Glucose, Bld: 134 mg/dL — ABNORMAL HIGH (ref 70–99)
Potassium: 2.8 mmol/L — ABNORMAL LOW (ref 3.5–5.1)
SODIUM: 128 mmol/L — AB (ref 135–145)
Total Bilirubin: 3.5 mg/dL — ABNORMAL HIGH (ref 0.3–1.2)
Total Protein: 9.5 g/dL — ABNORMAL HIGH (ref 6.5–8.1)

## 2018-03-09 LAB — LIPASE, BLOOD: Lipase: 48 U/L (ref 11–51)

## 2018-03-09 MED ORDER — POTASSIUM CHLORIDE CRYS ER 20 MEQ PO TBCR
40.0000 meq | EXTENDED_RELEASE_TABLET | Freq: Once | ORAL | Status: AC
  Administered 2018-03-09: 40 meq via ORAL
  Filled 2018-03-09: qty 2

## 2018-03-09 MED ORDER — SODIUM CHLORIDE 0.9 % IV BOLUS
1000.0000 mL | Freq: Once | INTRAVENOUS | Status: AC
  Administered 2018-03-09: 1000 mL via INTRAVENOUS

## 2018-03-09 MED ORDER — FENTANYL CITRATE (PF) 100 MCG/2ML IJ SOLN
50.0000 ug | Freq: Once | INTRAMUSCULAR | Status: AC
  Administered 2018-03-09: 50 ug via INTRAVENOUS
  Filled 2018-03-09: qty 2

## 2018-03-09 NOTE — ED Provider Notes (Signed)
[facility] Emergency Department Provider Note   ____________________________________________   First MD Initiated Contact with Patient 03/09/18 2328     (approximate)  I have reviewed the triage vital signs and the nursing notes.   HISTORY  Chief Complaint Abdominal Pain    HPI Susan [name] is a 47 y.o. female who presents to the ED from home with a chief complaint of abdominal pain.  Patient reports upper abdominal pain for "a while".  States she has been having diarrhea for the past 2 weeks.  Unable to eat or drink anything without having diarrhea.  Last week she had some nausea vomiting but none currently.  Reports a history of cirrhosis and hepatitis.  Denies associated fever, chills, chest pain, shortness of breath, dysuria.  Denies recent travel or trauma.   Past Medical History:  Diagnosis Date  . Allergy   . Anemia   . Anxiety   . Asthma   . Cirrhosis (HCC)   . COPD (chronic obstructive pulmonary disease) (HCC) 12/09/2014  . Depression   . Emphysema of lung (HCC)   . GERD (gastroesophageal reflux disease)   . Heart murmur   . Hepatitis C    pt said they told her she had this last visit here  . Hyperlipidemia   . Hypertension     Patient Active Problem List   Diagnosis Date Noted  . ASCUS of cervix with negative high risk HPV 12/14/2017  . Biliary obstruction   . Delirium tremens (HCC)   . Alcohol withdrawal syndrome, with delirium (HCC)   . Acute abdominal pain 04/28/2017  . Elevated LFTs 04/02/2015  . Hepatic cirrhosis (HCC) 12/12/2014  . Diarrhea 12/09/2014  . Blood in stool 12/09/2014  . Abdominal pain 12/09/2014  . COPD (chronic obstructive pulmonary disease) (HCC) 12/09/2014    Past Surgical History:  Procedure Laterality Date  . BREAST CYST ASPIRATION Right 12/2017   abscess aspiration  . CESAREAN SECTION  2009    Prior to Admission medications   Medication Sig Start Date End Date Taking? Authorizing Provider    albuterol (PROVENTIL HFA;VENTOLIN HFA) 108 (90 Base) MCG/ACT inhaler Inhale 1 puff into the lungs every 4 (four) hours as needed for wheezing or shortness of breath. 05/04/17  Yes Auburn Bilberry, MD  aluminum-magnesium hydroxide-simethicone (MAALOX) 200-200-20 MG/5ML SUSP Take 30 mLs 4 (four) times daily -  before meals and at bedtime by mouth. 06/15/17  Yes Sharman Cheek, MD  budesonide-formoterol [facility]) 80-4.5 MCG/ACT inhaler Inhale 2 puffs into the lungs 2 (two) times daily. 05/04/17  Yes Auburn Bilberry, MD  clonazePAM (KLONOPIN) 1 MG tablet Take 1 tablet (1 mg total) by mouth 2 (two) times daily. 05/04/17  Yes Auburn Bilberry, MD  escitalopram (LEXAPRO) 20 MG tablet Take 1 tablet (20 mg total) by mouth at bedtime. 05/04/17  Yes Auburn Bilberry, MD  furosemide (LASIX) 40 MG tablet Take 1 tablet (40 mg total) by mouth 2 (two) times daily. 05/04/17  Yes Auburn Bilberry, MD  hydrOXYzine (ATARAX/VISTARIL) 25 MG tablet Take 1 tablet (25 mg total) by mouth 3 (three) times daily as needed (itching). 04/07/15  Yes Sudini, Wardell Heath, MD  ipratropium-albuterol (DUONEB) 0.5-2.5 (3) MG/3ML SOLN Take 3 mLs by nebulization every 6 (six) hours as needed (for shortness of breath/wheezing).    Yes [provider]  loratadine (CLARITIN) 10 MG tablet Take 1 tablet (10 mg total) by mouth daily. 05/04/17  Yes Auburn Bilberry, MD  metoCLOPramide (REGLAN) 10 MG tablet Take 1 tablet (10  mg total) every 6 (six) hours as needed by mouth. 06/15/17  Yes Sharman CheekStafford, Phillip, MD  pantoprazole (PROTONIX) 40 MG tablet Take 1 tablet (40 mg total) by mouth daily. 10/29/17 03/10/18 Yes Darci CurrentBrown, Castorland N, MD  Potassium Chloride ER 20 MEQ TBCR Take 40 mEq by mouth daily. 05/04/17  Yes Auburn BilberryPatel, Shreyang, MD  spironolactone (ALDACTONE) 100 MG tablet Take 1 tablet (100 mg total) by mouth daily. 05/04/17  Yes Auburn BilberryPatel, Shreyang, MD    Allergies Doxycycline; Latex; Promethazine; Ranitidine; and Zofran [ondansetron hcl]  Family History   Problem Relation Age of Onset  . Other Mother   . Lung cancer Mother   . Skin cancer Father   . Breast cancer Maternal Grandmother [id number]    Social History Social History   Tobacco Use  . Smoking status: Current Every Day Smoker    Packs/day: 1.00    Years: 20.00    Pack years: 20.00    Types: Cigarettes  . Smokeless tobacco: Never Used  Substance Use Topics  . Alcohol use: No    Comment: Quit drinking few years ago.  . Drug use: No    Review of Systems  Constitutional: No fever/chills Eyes: No visual changes. ENT: No sore throat. Cardiovascular: Denies chest pain. Respiratory: Denies shortness of breath. Gastrointestinal: Positive for abdominal pain.  No nausea, no vomiting.  Positive for diarrhea.  No constipation. Genitourinary: Negative for dysuria. Musculoskeletal: Negative for back pain. Skin: Negative for rash. Neurological: Negative for headaches, focal weakness or numbness.   ____________________________________________   PHYSICAL EXAM:  VITAL SIGNS: ED Triage Vitals  Enc Vitals Group     BP 03/09/18 2027 120/85     Pulse Rate 03/09/18 2027 (!) 125     Resp 03/09/18 2027 18     Temp 03/09/18 2027 98.2 F (36.8 C)     Temp Source 03/09/18 2027 Oral     SpO2 03/09/18 2027 98 %     Weight 03/09/18 2027 165 lb (74.8 kg)     Height 03/09/18 2027 5\' 3"  (1.6 m)     Head Circumference --      Peak Flow --      Pain Score 03/09/18 2026 10     Pain Loc --      Pain Edu? --      Excl. in GC? --     Constitutional: Alert and oriented. Well appearing and in mild acute distress. Eyes: Scleral icterus. PERRL. EOMI. Head: Atraumatic. Nose: No congestion/rhinnorhea. Mouth/Throat: Mucous membranes are moist.  Oropharynx non-erythematous. Neck: No stridor.   Cardiovascular: Normal rate, regular rhythm. Grossly normal heart sounds.  Good peripheral circulation. Respiratory: Normal respiratory effort.  No retractions. Lungs CTAB. Gastrointestinal: Soft and  mildly diffusely tender to palpation; maximally at epigastrium and right upper quadrant.  No rebound or guarding.  No distention. No abdominal bruits. No CVA tenderness. Musculoskeletal: No lower extremity tenderness nor edema.  No joint effusions. Neurologic:  Normal speech and language. No gross focal neurologic deficits are appreciated. No gait instability. Skin:  Skin is warm, dry and intact. No rash noted. Psychiatric: Mood and affect are normal. Speech and behavior are normal.  ____________________________________________   LABS (all labs ordered are listed, but only abnormal results are displayed)  Labs Reviewed  GASTROINTESTINAL PANEL BY PCR, STOOL (REPLACES STOOL CULTURE) - Abnormal; Notable for the following components:      Result Value   Campylobacter species DETECTED (*)    Enteropathogenic E coli (EPEC) DETECTED (*)  All other components within normal limits  COMPREHENSIVE METABOLIC PANEL - Abnormal; Notable for the following components:   Sodium 128 (*)    Potassium 2.8 (*)    CO2 18 (*)    Glucose, Bld 134 (*)    Total Protein 9.5 (*)    Albumin 3.4 (*)    AST 93 (*)    ALT 51 (*)    Alkaline Phosphatase 168 (*)    Total Bilirubin 3.5 (*)    All other components within normal limits  CBC - Abnormal; Notable for the following components:   Platelets 70 (*)    All other components within normal limits  URINALYSIS, COMPLETE (UACMP) WITH MICROSCOPIC - Abnormal; Notable for the following components:   Color, Urine AMBER (*)    APPearance HAZY (*)    Hgb urine dipstick SMALL (*)    Protein, ur 30 (*)    Bacteria, UA RARE (*)    All other components within normal limits  C DIFFICILE QUICK SCREEN W PCR REFLEX  LIPASE, BLOOD  MAGNESIUM  PHOSPHORUS   ____________________________________________  EKG  ED ECG REPORT I, Nykeria Mealing J, the attending physician, personally viewed and interpreted this ECG.   Date: 03/09/2018  EKG Time: 2032  Rate: 112  Rhythm:  sinus tachycardia  Axis: Normal  Intervals:none  ST&T Change: Nonspecific  ____________________________________________  RADIOLOGY  ED MD interpretation: No cholelithiasis  Official radiology report(s): US Abdomen Limited Ruq  Result Date: 03/10/2018 CLINICAL DATA:  Upper abdominal pain. EXAM: ULTRASOUND ABDOMEN LIMITED RIGHT UPPER QUADRANT COMPARISON:  MRI 04/30/2017 FINDINGS: Gallbladder: Layering sludge without stones. No gallbladder wall thickening. Sonographer reports no sonographic Murphy sign. Common bile duct: Diameter: 8-9 mm Liver: Heterogeneous echotexture with nodular contour, findings compatible with cirrhosis. Portal vein is patent on color Doppler imaging with normal direction of blood flow towards the liver. IMPRESSION: 1. No evidence for cholelithiasis. 2. Extrahepatic biliary duct dilatation without identifiable obstructing etiology. Correlation with liver function test recommended. MRI/MRCP may prove helpful to further evaluate. Electronically Signed   By: Kennith Center M.D.   On: 03/10/2018 01:56    ____________________________________________   PROCEDURES  Procedure(s) performed: None  Procedures  Critical Care performed: No  ____________________________________________   INITIAL IMPRESSION / ASSESSMENT AND PLAN / ED COURSE  As part of my medical decision making, I reviewed the following data within the electronic MEDICAL RECORD NUMBER Nursing notes reviewed and incorporated, Labs reviewed, EKG interpreted, Old chart reviewed, Radiograph reviewed and Notes from prior ED visits   47 year old female with cirrhosis who presents with upper abdominal pain and diarrhea. Differential diagnosis includes, but is not limited to, biliary disease (biliary colic, acute cholecystitis, cholangitis, choledocholithiasis, etc), intrathoracic causes for epigastric abdominal pain including ACS, gastritis, duodenitis, pancreatitis, small bowel or large bowel obstruction, abdominal  aortic aneurysm, hernia, and ulcer(s).  Auditory results notable for worsening bilirubin, hyponatremia, hypokalemia.  Will initiate IV fluid resuscitation, 50 mcg IV fentanyl for pain, oral potassium repletion and proceed with abdominal ultrasound.  Clinical Course as of Mar 10 699  Sun Mar 10, 2018  0323 C. difficile is negative.  Updated patient on ultrasound results.  Will discuss with hospitalist to evaluate patient in the emergency department for admission.   [JS]    Clinical Course User Index [JS] Irean Hong, MD     ____________________________________________   FINAL CLINICAL IMPRESSION(S) / ED DIAGNOSES  Final diagnoses:  Right upper quadrant abdominal pain  Diarrhea, unspecified type  Elevated LFTs  ED Discharge Orders    None       Note:  This document was prepared using Dragon voice recognition software and may include unintentional dictation errors.    Irean Hong, MD 03/10/18 0700

## 2018-03-09 NOTE — ED Triage Notes (Signed)
Patient reports having abdominal pain, headache and feeling weak.

## 2018-03-10 ENCOUNTER — Encounter: Payer: Self-pay | Admitting: Emergency Medicine

## 2018-03-10 ENCOUNTER — Emergency Department: Payer: Medicaid Other

## 2018-03-10 ENCOUNTER — Observation Stay: Payer: Medicaid Other

## 2018-03-10 LAB — GASTROINTESTINAL PANEL BY PCR, STOOL (REPLACES STOOL CULTURE)
ASTROVIRUS: NOT DETECTED
Adenovirus F40/41: NOT DETECTED
CAMPYLOBACTER SPECIES: DETECTED — AB
Cryptosporidium: NOT DETECTED
Cyclospora cayetanensis: NOT DETECTED
ENTEROPATHOGENIC E COLI (EPEC): DETECTED — AB
ENTEROTOXIGENIC E COLI (ETEC): NOT DETECTED
Entamoeba histolytica: NOT DETECTED
Enteroaggregative E coli (EAEC): NOT DETECTED
Giardia lamblia: NOT DETECTED
NOROVIRUS GI/GII: NOT DETECTED
PLESIMONAS SHIGELLOIDES: NOT DETECTED
ROTAVIRUS A: NOT DETECTED
SHIGA LIKE TOXIN PRODUCING E COLI (STEC): NOT DETECTED
SHIGELLA/ENTEROINVASIVE E COLI (EIEC): NOT DETECTED
Salmonella species: NOT DETECTED
Sapovirus (I, II, IV, and V): NOT DETECTED
VIBRIO CHOLERAE: NOT DETECTED
Vibrio species: NOT DETECTED
Yersinia enterocolitica: NOT DETECTED

## 2018-03-10 LAB — C DIFFICILE QUICK SCREEN W PCR REFLEX
C DIFFICILE (CDIFF) TOXIN: NEGATIVE
C DIFFICLE (CDIFF) ANTIGEN: NEGATIVE
C Diff interpretation: NOT DETECTED

## 2018-03-10 LAB — MAGNESIUM: MAGNESIUM: 2.1 mg/dL (ref 1.7–2.4)

## 2018-03-10 LAB — POTASSIUM: Potassium: 3.3 mmol/L — ABNORMAL LOW (ref 3.5–5.1)

## 2018-03-10 LAB — PHOSPHORUS: Phosphorus: 2.6 mg/dL (ref 2.5–4.6)

## 2018-03-10 MED ORDER — POTASSIUM CHLORIDE 10 MEQ/100ML IV SOLN
10.0000 meq | INTRAVENOUS | Status: AC
  Administered 2018-03-10 (×4): 10 meq via INTRAVENOUS
  Filled 2018-03-10 (×5): qty 100

## 2018-03-10 MED ORDER — GADOBENATE DIMEGLUMINE 529 MG/ML IV SOLN
15.0000 mL | Freq: Once | INTRAVENOUS | Status: AC | PRN
  Administered 2018-03-10: 15 mL via INTRAVENOUS

## 2018-03-10 MED ORDER — METOCLOPRAMIDE HCL 5 MG/ML IJ SOLN: 10.0000 mg | Freq: Four times a day (QID) | INTRAMUSCULAR | Status: DC | PRN

## 2018-03-10 MED ORDER — BISACODYL 5 MG PO TBEC: 5.0000 mg | DELAYED_RELEASE_TABLET | Freq: Every day | ORAL | Status: DC | PRN

## 2018-03-10 MED ORDER — PROCHLORPERAZINE EDISYLATE 10 MG/2ML IJ SOLN: 10.0000 mg | Freq: Four times a day (QID) | INTRAMUSCULAR | Status: DC | PRN

## 2018-03-10 MED ORDER — MOMETASONE FURO-FORMOTEROL FUM 100-5 MCG/ACT IN AERO
2.0000 | INHALATION_SPRAY | Freq: Two times a day (BID) | RESPIRATORY_TRACT | Status: DC
  Administered 2018-03-10 – 2018-03-15 (×10): 2 via RESPIRATORY_TRACT
  Filled 2018-03-10: qty 8.8

## 2018-03-10 MED ORDER — NICOTINE 21 MG/24HR TD PT24
21.0000 mg | MEDICATED_PATCH | Freq: Every day | TRANSDERMAL | Status: DC
  Administered 2018-03-10 – 2018-03-15 (×6): 21 mg via TRANSDERMAL
  Filled 2018-03-10 (×6): qty 1

## 2018-03-10 MED ORDER — PANTOPRAZOLE SODIUM 40 MG PO TBEC
40.0000 mg | DELAYED_RELEASE_TABLET | Freq: Every day | ORAL | Status: DC
  Administered 2018-03-10 – 2018-03-15 (×6): 40 mg via ORAL
  Filled 2018-03-10 (×6): qty 1

## 2018-03-10 MED ORDER — POTASSIUM CHLORIDE IN NACL 20-0.9 MEQ/L-% IV SOLN
INTRAVENOUS | Status: DC
  Administered 2018-03-10 – 2018-03-11 (×2): via INTRAVENOUS
  Administered 2018-03-11: 125 mL/h via INTRAVENOUS
  Administered 2018-03-12 (×2): via INTRAVENOUS
  Filled 2018-03-10 (×9): qty 1000

## 2018-03-10 MED ORDER — KCL-LACTATED RINGERS 20 MEQ/L IV SOLN: INTRAVENOUS | Status: DC

## 2018-03-10 MED ORDER — ESCITALOPRAM OXALATE 10 MG PO TABS
20.0000 mg | ORAL_TABLET | Freq: Every day | ORAL | Status: DC
  Administered 2018-03-10 – 2018-03-14 (×4): 20 mg via ORAL
  Filled 2018-03-10 (×6): qty 2

## 2018-03-10 MED ORDER — ENOXAPARIN SODIUM 40 MG/0.4ML ~~LOC~~ SOLN
40.0000 mg | SUBCUTANEOUS | Status: DC
  Administered 2018-03-10 – 2018-03-13 (×4): 40 mg via SUBCUTANEOUS
  Filled 2018-03-10 (×4): qty 0.4

## 2018-03-10 MED ORDER — HYDROXYZINE HCL 25 MG PO TABS
25.0000 mg | ORAL_TABLET | Freq: Three times a day (TID) | ORAL | Status: DC | PRN
  Administered 2018-03-14: 25 mg via ORAL
  Filled 2018-03-10 (×2): qty 1

## 2018-03-10 MED ORDER — NICOTINE 21 MG/24HR TD PT24
21.0000 mg | MEDICATED_PATCH | Freq: Once | TRANSDERMAL | Status: AC
  Administered 2018-03-10: 21 mg via TRANSDERMAL
  Filled 2018-03-10: qty 1

## 2018-03-10 MED ORDER — POTASSIUM CHLORIDE 2 MEQ/ML IV SOLN
INTRAVENOUS | Status: DC
  Filled 2018-03-10 (×2): qty 1000

## 2018-03-10 MED ORDER — IPRATROPIUM-ALBUTEROL 0.5-2.5 (3) MG/3ML IN SOLN: 3.0000 mL | RESPIRATORY_TRACT | Status: DC | PRN

## 2018-03-10 MED ORDER — TRAMADOL HCL 50 MG PO TABS
50.0000 mg | ORAL_TABLET | Freq: Four times a day (QID) | ORAL | Status: DC
  Administered 2018-03-11 – 2018-03-14 (×13): 50 mg via ORAL
  Filled 2018-03-10 (×14): qty 1

## 2018-03-10 MED ORDER — HYDROMORPHONE HCL 1 MG/ML IJ SOLN
0.5000 mg | INTRAMUSCULAR | Status: DC | PRN
  Administered 2018-03-10 (×3): 0.5 mg via INTRAVENOUS
  Filled 2018-03-10 (×3): qty 1

## 2018-03-10 MED ORDER — FENTANYL CITRATE (PF) 100 MCG/2ML IJ SOLN
50.0000 ug | Freq: Once | INTRAMUSCULAR | Status: AC
  Administered 2018-03-10: 50 ug via INTRAVENOUS
  Filled 2018-03-10: qty 2

## 2018-03-10 MED ORDER — CLONAZEPAM 1 MG PO TABS
1.0000 mg | ORAL_TABLET | Freq: Two times a day (BID) | ORAL | Status: DC
  Administered 2018-03-10 – 2018-03-15 (×8): 1 mg via ORAL
  Filled 2018-03-10 (×9): qty 1

## 2018-03-10 MED ORDER — ALUM & MAG HYDROXIDE-SIMETH 200-200-20 MG/5ML PO SUSP
30.0000 mL | Freq: Four times a day (QID) | ORAL | Status: DC | PRN
  Administered 2018-03-10: 30 mL via ORAL
  Filled 2018-03-10: qty 30

## 2018-03-10 MED ORDER — AZITHROMYCIN 500 MG PO TABS
500.0000 mg | ORAL_TABLET | Freq: Every day | ORAL | Status: AC
  Administered 2018-03-10 – 2018-03-12 (×3): 500 mg via ORAL
  Filled 2018-03-10 (×3): qty 1

## 2018-03-10 MED ORDER — LORAZEPAM 2 MG/ML IJ SOLN
1.0000 mg | Freq: Once | INTRAMUSCULAR | Status: AC
  Administered 2018-03-10: 1 mg via INTRAVENOUS
  Filled 2018-03-10: qty 1

## 2018-03-10 MED ORDER — SENNOSIDES-DOCUSATE SODIUM 8.6-50 MG PO TABS: 1.0000 | ORAL_TABLET | Freq: Every evening | ORAL | Status: DC | PRN

## 2018-03-10 NOTE — ED Notes (Signed)
MD still at bedside and notified of stool results; acknowledged

## 2018-03-10 NOTE — ED Notes (Signed)
Pt rates pain 10/10; voice texting on cell phone while receiving pain medication; saline lock flushed without difficulty

## 2018-03-10 NOTE — ED Notes (Signed)
Pt using call bell; voices need to have another bowel movement; monitor/bp cuff removed and pt to toilet without assistance

## 2018-03-10 NOTE — H&P (Signed)
[facility] [place] [facility]   PATIENT NAME: Susan CapriceChristy [name]    MR#:  [id number]  DATE OF BIRTH:  02/22/1971  DATE OF ADMISSION:  03/09/2018  PRIMARY CARE PHYSICIAN: Sherron Mondayejan-Sie, S Ahmed, MD   REQUESTING/REFERRING PHYSICIAN: Irean HongSung, Jade J, MD  CHIEF COMPLAINT:   Chief Complaint  Patient presents with  . Abdominal Pain    HISTORY OF PRESENT ILLNESS:  Susan CapriceChristy Conaty  is a 47 y.o. female with a known history of cirrhosis, COPD who p/w diarrhea/AP. Pt endorses a 3-4wk Hx diarrhea, which she states was occurring 4-6x/d, liquid brown stools, intermittently w/ pus/mucus. She denies green/explosive stool or blood in the stool. She states that for the past 2d, frequency has increased, episodes xTNTC. She has been incontinent of stool. Eventually she states her stooling has progressed to passage of clear liquid. She endorses abdominal pain, RUQ + RLQ + LLQ + diffuse, crampy, dull, achy. She endorses fatigue/malaise, generalized weakness and decreased exercise tolerance. She endorses poor PO intake x2d, dehydration. She endorses subjective fever, chills and diaphoresis. She endorses N/V x2 at home. She denies changes in diet, sick contacts or recent travel. She states there are stray cats living under her deck. She states she has been doing some gardening and outdoor work (growing tomatoes), and though likely unrelated, I believe she mentioned a tick bite on the ankle.  PAST MEDICAL HISTORY:   Past Medical History:  Diagnosis Date  . Allergy   . Anemia   . Anxiety   . Asthma   . Cirrhosis (HCC)   . COPD (chronic obstructive pulmonary disease) (HCC) 12/09/2014  . Depression   . Emphysema of lung (HCC)   . GERD (gastroesophageal reflux disease)   . Heart murmur   . Hepatitis C    pt said they told her she had this last visit here  . Hyperlipidemia   . Hypertension     PAST SURGICAL HISTORY:   Past Surgical History:  Procedure Laterality Date  . BREAST CYST  ASPIRATION Right 12/2017   abscess aspiration  . CESAREAN SECTION  2009    SOCIAL HISTORY:   Social History   Tobacco Use  . Smoking status: Current Every Day Smoker    Packs/day: 1.00    Years: 20.00    Pack years: 20.00    Types: Cigarettes  . Smokeless tobacco: Never Used  Substance Use Topics  . Alcohol use: No    Comment: Quit drinking few years ago.    FAMILY HISTORY:   Family History  Problem Relation Age of Onset  . Other Mother   . Lung cancer Mother   . Skin cancer Father   . Breast cancer Maternal Grandmother 60    DRUG ALLERGIES:   Allergies  Allergen Reactions  . Doxycycline Hives  . Latex Hives  . Promethazine Diarrhea  . Ranitidine Nausea Only  . Zofran [Ondansetron Hcl] Itching    REVIEW OF SYSTEMS:   Review of Systems  Constitutional: Positive for chills, diaphoresis, fever and malaise/fatigue. Negative for weight loss.  HENT: Negative for congestion, ear pain, hearing loss, nosebleeds, sinus pain, sore throat and tinnitus.   Eyes: Negative for blurred vision, double vision and photophobia.  Respiratory: Negative for cough, hemoptysis, sputum production, shortness of breath and wheezing.   Cardiovascular: Negative for chest pain, palpitations, orthopnea, claudication, leg swelling and PND.  Gastrointestinal: Positive for abdominal pain, diarrhea, nausea and vomiting. Negative for blood in stool, constipation, heartburn and melena.  Genitourinary: Negative  for dysuria, frequency, hematuria and urgency.  Musculoskeletal: Negative for back pain, falls, joint pain, myalgias and neck pain.  Skin: Negative for itching and rash.  Neurological: Positive for weakness. Negative for dizziness, tingling, tremors, sensory change, speech change, focal weakness, seizures, loss of consciousness and headaches.  Psychiatric/Behavioral: Negative for memory loss. The patient does not have insomnia.    MEDICATIONS AT HOME:   Prior to Admission medications     Medication Sig Start Date End Date Taking? Authorizing Provider  albuterol (PROVENTIL HFA;VENTOLIN HFA) 108 (90 Base) MCG/ACT inhaler Inhale 1 puff into the lungs every 4 (four) hours as needed for wheezing or shortness of breath. 05/04/17  Yes Auburn Bilberry, MD  aluminum-magnesium hydroxide-simethicone (MAALOX) 200-200-20 MG/5ML SUSP Take 30 mLs 4 (four) times daily -  before meals and at bedtime by mouth. 06/15/17  Yes Sharman Cheek, MD  budesonide-formoterol [facility]) 80-4.5 MCG/ACT inhaler Inhale 2 puffs into the lungs 2 (two) times daily. 05/04/17  Yes Auburn Bilberry, MD  clonazePAM (KLONOPIN) 1 MG tablet Take 1 tablet (1 mg total) by mouth 2 (two) times daily. 05/04/17  Yes Auburn Bilberry, MD  escitalopram (LEXAPRO) 20 MG tablet Take 1 tablet (20 mg total) by mouth at bedtime. 05/04/17  Yes Auburn Bilberry, MD  furosemide (LASIX) 40 MG tablet Take 1 tablet (40 mg total) by mouth 2 (two) times daily. 05/04/17  Yes Auburn Bilberry, MD  hydrOXYzine (ATARAX/VISTARIL) 25 MG tablet Take 1 tablet (25 mg total) by mouth 3 (three) times daily as needed (itching). 04/07/15  Yes Sudini, Wardell Heath, MD  ipratropium-albuterol (DUONEB) 0.5-2.5 (3) MG/3ML SOLN Take 3 mLs by nebulization every 6 (six) hours as needed (for shortness of breath/wheezing).    Yes [provider]  loratadine (CLARITIN) 10 MG tablet Take 1 tablet (10 mg total) by mouth daily. 05/04/17  Yes Auburn Bilberry, MD  metoCLOPramide (REGLAN) 10 MG tablet Take 1 tablet (10 mg total) every 6 (six) hours as needed by mouth. 06/15/17  Yes Sharman Cheek, MD  pantoprazole (PROTONIX) 40 MG tablet Take 1 tablet (40 mg total) by mouth daily. 10/29/17 03/10/18 Yes Darci Current, MD  Potassium Chloride ER 20 MEQ TBCR Take 40 mEq by mouth daily. 05/04/17  Yes Auburn Bilberry, MD  spironolactone (ALDACTONE) 100 MG tablet Take 1 tablet (100 mg total) by mouth daily. 05/04/17  Yes Auburn Bilberry, MD      VITAL SIGNS:  Blood pressure 117/84,  pulse 87, temperature 98.2 F (36.8 C), temperature source Oral, resp. rate 15, height 5\' 3"  (1.6 m), weight 74.8 kg (165 lb), SpO2 98 %.  PHYSICAL EXAMINATION:  Physical Exam  Constitutional: She is oriented to person, place, and time. She appears well-developed and well-nourished. She is active and cooperative.  Non-toxic appearance. She does not have a sickly appearance. She appears ill. No distress. She is not intubated.  HENT:  Head: Normocephalic and atraumatic.  Mouth/Throat: Oropharynx is clear and moist. No oropharyngeal exudate.  Eyes: Conjunctivae, EOM and lids are normal. No scleral icterus.  Neck: Neck supple. No JVD present. No thyromegaly present.  Cardiovascular: Regular rhythm, S1 normal, S2 normal and normal heart sounds.  No extrasystoles are present. Tachycardia present. Exam reveals no gallop, no S3, no S4, no distant heart sounds and no friction rub.  No murmur heard. Pulmonary/Chest: Effort normal. No accessory muscle usage or stridor. No apnea, no tachypnea and no bradypnea. She is not intubated. No respiratory distress. She has decreased breath sounds in the right upper field, the right  middle field, the right lower field, the left upper field, the left middle field and the left lower field. She has no wheezes. She has no rhonchi. She has no rales.  Abdominal: Soft. Normal appearance. She exhibits no distension. Bowel sounds are decreased. There is generalized tenderness and tenderness in the right upper quadrant, right lower quadrant and left lower quadrant. There is no rigidity, no rebound and no guarding.  Lymphadenopathy:    She has no cervical adenopathy.  Neurological: She is alert and oriented to person, place, and time. She is not disoriented.  Skin: Skin is warm, dry and intact. No rash noted. She is not diaphoretic. No erythema.  Psychiatric: She has a normal mood and affect. Her speech is normal and behavior is normal. Judgment and thought content normal.  Cognition and memory are normal.   (+) tachycardia (HR 110-120) at the time of my assessment. LABORATORY PANEL:   CBC Recent Labs  Lab 03/09/18 2031  WBC 10.8  HGB 15.0  HCT 43.2  PLT 70*   ------------------------------------------------------------------------------------------------------------------  Chemistries  Recent Labs  Lab 03/09/18 2031  NA 128*  K 2.8*  CL 100  CO2 18*  GLUCOSE 134*  BUN 12  CREATININE 0.61  CALCIUM 9.5  AST 93*  ALT 51*  ALKPHOS 168*  BILITOT 3.5*   ------------------------------------------------------------------------------------------------------------------  Cardiac Enzymes No results for input(s): TROPONINI in the last 168 hours. ------------------------------------------------------------------------------------------------------------------  RADIOLOGY:  US Abdomen Limited Ruq  Result Date: 03/10/2018 CLINICAL DATA:  Upper abdominal pain. EXAM: ULTRASOUND ABDOMEN LIMITED RIGHT UPPER QUADRANT COMPARISON:  MRI 04/30/2017 FINDINGS: Gallbladder: Layering sludge without stones. No gallbladder wall thickening. Sonographer reports no sonographic Murphy sign. Common bile duct: Diameter: 8-9 mm Liver: Heterogeneous echotexture with nodular contour, findings compatible with cirrhosis. Portal vein is patent on color Doppler imaging with normal direction of blood flow towards the liver. IMPRESSION: 1. No evidence for cholelithiasis. 2. Extrahepatic biliary duct dilatation without identifiable obstructing etiology. Correlation with liver function test recommended. MRI/MRCP may prove helpful to further evaluate. Electronically Signed   By: Kennith Center M.D.   On: 03/10/2018 01:56   IMPRESSION AND PLAN:   A/P: 66F diarrhea/AP, N/V, poor PO intake/dehydration, hyponatremia (Na+ 128), hypokalemia. GI panel (+) Campylobacter + EPEC. Cirrhosis w/ transaminasemia, hyperbilirubinemia, hypoalbuminemia, thrombocytopenia. Hyperglycemia. -Continues to have  diarrhea -Afebrile, (-) WBC -GI panel (+) Campylobacter + EPEC -Not ordered for antidiarrheals -Pain ctrl, antiemetics PRN -Was initially tachycardic, improved w/ IVF; has had more diarrhea since that time, and became tachycardic again by the time of my assessment -Med-surg w/ off-unit temporary cardiac monitoring -K+ 2.8, replete aggressively -Na+ was 137 as of 10/29/2017 -IVF, monitor BMP -Mag and Phos levels pending -Duration of diarrhea (x3-4wks), initially less frequent; possible that pt has developed chronic diarrhea 2/2 cirrhosis, and is now suffering w/ superimposed infectious diarrhea; if this scenario is indeed suspected to be the case, pt will need further stool testing and possible endoscopy/colonoscopy (outpt GI) -Lab abnormalities (including but not limited to transaminasemia, hyperbilirubinemia, hypoalbuminemia, thrombocytopenia) consistent w/ cirrhosis; most indices improved compared to prior labwork; bilirubin slightly increased, possibly 2/2 dehydration, (-) jaundice/icterus -RUQ U/S (+) cirrhosis, (-) cholelithiasis, (+) extrahepatic biliary duct dilatation w/o identifiable obstructing etiology -c/w home meds/formulary subs -Cardiac (low sodium) diet as tolerated -Lovenox -Full code -Observation, < 2 midnights   All the records are reviewed and case discussed with ED provider. Management plans discussed with the patient, family and they are in agreement.  CODE STATUS: Full code  TOTAL TIME  TAKING CARE OF THIS PATIENT: 75 minutes.    Barbaraann Rondo M.D on 03/10/2018 at 4:06 AM  Between 7am to 6pm - Pager - [phone]  After 6pm go to [email or web address] - Social research officer, government  [facility] [place] [facility]  Office  [phone]  CC: Primary care physician; [name], MD   Note: This dictation was prepared with Dragon dictation along with smaller phrase technology. Any transcriptional errors that result from this process are unintentional.

## 2018-03-10 NOTE — Progress Notes (Signed)
[facility] - [place] [facility]   PATIENT NAME: Susan CapriceChristy Wardlow    MR#:  [id number]  DATE OF BIRTH:  06/12/1971  SUBJECTIVE:  CHIEF COMPLAINT:   Chief Complaint  Patient presents with  . Abdominal Pain  complaints of Abd pain and requesting stronger pain meds REVIEW OF SYSTEMS:  Review of Systems  Constitutional: Negative for diaphoresis, fever, malaise/fatigue and weight loss.  HENT: Negative for ear discharge, ear pain, hearing loss, nosebleeds, sore throat and tinnitus.   Eyes: Negative for blurred vision and pain.  Respiratory: Negative for cough, hemoptysis, shortness of breath and wheezing.   Cardiovascular: Negative for chest pain, palpitations, orthopnea and leg swelling.  Gastrointestinal: Positive for abdominal pain, diarrhea and nausea. Negative for blood in stool, constipation, heartburn and vomiting.  Genitourinary: Negative for dysuria, frequency and urgency.  Musculoskeletal: Negative for back pain and myalgias.  Skin: Negative for itching and rash.  Neurological: Negative for dizziness, tingling, tremors, focal weakness, seizures, weakness and headaches.  Psychiatric/Behavioral: Negative for depression. The patient is not nervous/anxious.    DRUG ALLERGIES:   Allergies  Allergen Reactions  . Doxycycline Hives  . Latex Hives  . Promethazine Diarrhea  . Ranitidine Nausea Only  . Zofran [Ondansetron Hcl] Itching   VITALS:  Blood pressure 107/69, pulse 89, temperature 97.8 F (36.6 C), temperature source Oral, resp. rate 16, height 5\' 3"  (1.6 m), weight 74.8 kg (165 lb), SpO2 96 %. PHYSICAL EXAMINATION:  Physical Exam  Constitutional: She is oriented to person, place, and time.  HENT:  Head: Normocephalic and atraumatic.  Eyes: Pupils are equal, round, and reactive to light. Conjunctivae and EOM are normal.  Neck: Normal range of motion. Neck supple. No tracheal deviation present. No thyromegaly present.  Cardiovascular: Normal rate,  regular rhythm and normal heart sounds.  Pulmonary/Chest: Effort normal and breath sounds normal. No respiratory distress. She has no wheezes. She exhibits no tenderness.  Abdominal: Soft. Bowel sounds are normal. She exhibits no distension. There is generalized tenderness.  Musculoskeletal: Normal range of motion.  Neurological: She is alert and oriented to person, place, and time. No cranial nerve deficit.  Skin: Skin is warm and dry. No rash noted.   LABORATORY PANEL:  Female CBC Recent Labs  Lab 03/09/18 2031  WBC 10.8  HGB 15.0  HCT 43.2  PLT 70*   ------------------------------------------------------------------------------------------------------------------ Chemistries  Recent Labs  Lab 03/09/18 2031 03/10/18 1130  NA 128*  --   K 2.8* 3.3*  CL 100  --   CO2 18*  --   GLUCOSE 134*  --   BUN 12  --   CREATININE 0.61  --   CALCIUM 9.5  --   MG 2.1  --   AST 93*  --   ALT 51*  --   ALKPHOS 168*  --   BILITOT 3.5*  --    RADIOLOGY:  [place] Abdomen Limited Ruq  Result Date: 03/10/2018 CLINICAL DATA:  Upper abdominal pain. EXAM: ULTRASOUND ABDOMEN LIMITED RIGHT UPPER QUADRANT COMPARISON:  MRI 04/30/2017 FINDINGS: Gallbladder: Layering sludge without stones. No gallbladder wall thickening. Sonographer reports no sonographic Murphy sign. Common bile duct: Diameter: 8-9 mm Liver: Heterogeneous echotexture with nodular contour, findings compatible with cirrhosis. Portal vein is patent on color Doppler imaging with normal direction of blood flow towards the liver. IMPRESSION: 1. No evidence for cholelithiasis. 2. Extrahepatic biliary duct dilatation without identifiable obstructing etiology. Correlation with liver function test recommended. MRI/MRCP may prove helpful to further evaluate. Electronically Signed  By: Kennith Center M.D.   On: 03/10/2018 01:56   ASSESSMENT AND PLAN:  81F with known history of cirrhosis, COPD admitted for diarrhea/AP, N/V, poor PO  intake/dehydration  * N/V/Abd pain and Diarrhea: - Duration of diarrhea (x3-4wks), initially less frequent; possible that pt has developed chronic diarrhea 2/2 cirrhosis, and is now suffering w/ superimposed infectious diarrhea - GI panel (+) Campylobacter + EPEC - likely due to same - Treat with Azithromycin 500 mg PO daily for 3 days or till symptoms improve - will add prn Tramadol. Continue IV Dilaudid for break-through pain  * Hyponatremia/hypokalemia - Replete and recheck - Na improving with IVFs  * Cirrhosis w/ transaminasemia, hyperbilirubinemia, hypoalbuminemia - RUQ shows Extrahepatic biliary duct dilatation without identifiable obstructing etiology - will get MRCP for further eval  * Thrombocytopenia - likely due to cirrhosis  * Hyperglycemia - monitor blood sugar       All the records are reviewed and case discussed with Care Management/Social Worker. Management plans discussed with the patient, nursing and they are in agreement.  CODE STATUS: Full Code  TOTAL TIME TAKING CARE OF THIS PATIENT: 35 minutes.   More than 50% of the time was spent in counseling/coordination of care: YES  POSSIBLE D/C IN 1-2 DAYS, DEPENDING ON CLINICAL CONDITION.   Delfino Lovett M.D on 03/10/2018 at 1:04 PM  Between 7am to 6pm - Pager - [phone]  After 6pm go to [email or web address] - Social research officer, government  [facility] [place] [facility] [facility]  Office  [phone]  CC: Primary care physician; [name], MD  Note: This dictation was prepared with Dragon dictation along with smaller phrase technology. Any transcriptional errors that result from this process are unintentional.

## 2018-03-10 NOTE — ED Notes (Signed)
Pharmacy called regarding potassium drips; will send to 1A when ready as pt is leaving the ED shortly

## 2018-03-10 NOTE — ED Notes (Signed)
Admitting MD at bedside.

## 2018-03-10 NOTE — ED Notes (Signed)
[place] [facility];

## 2018-03-11 DIAGNOSIS — K838 Other specified diseases of biliary tract: Secondary | ICD-10-CM | POA: Diagnosis present

## 2018-03-11 DIAGNOSIS — K921 Melena: Secondary | ICD-10-CM | POA: Diagnosis not present

## 2018-03-11 DIAGNOSIS — E860 Dehydration: Secondary | ICD-10-CM | POA: Diagnosis present

## 2018-03-11 DIAGNOSIS — F17210 Nicotine dependence, cigarettes, uncomplicated: Secondary | ICD-10-CM | POA: Diagnosis present

## 2018-03-11 DIAGNOSIS — K8020 Calculus of gallbladder without cholecystitis without obstruction: Secondary | ICD-10-CM | POA: Diagnosis present

## 2018-03-11 DIAGNOSIS — D6959 Other secondary thrombocytopenia: Secondary | ICD-10-CM | POA: Diagnosis present

## 2018-03-11 DIAGNOSIS — E876 Hypokalemia: Secondary | ICD-10-CM | POA: Diagnosis present

## 2018-03-11 DIAGNOSIS — E785 Hyperlipidemia, unspecified: Secondary | ICD-10-CM | POA: Diagnosis present

## 2018-03-11 DIAGNOSIS — R739 Hyperglycemia, unspecified: Secondary | ICD-10-CM | POA: Diagnosis present

## 2018-03-11 DIAGNOSIS — R000 Tachycardia, unspecified: Secondary | ICD-10-CM | POA: Diagnosis present

## 2018-03-11 DIAGNOSIS — B182 Chronic viral hepatitis C: Secondary | ICD-10-CM | POA: Diagnosis present

## 2018-03-11 DIAGNOSIS — D649 Anemia, unspecified: Secondary | ICD-10-CM | POA: Diagnosis not present

## 2018-03-11 DIAGNOSIS — K219 Gastro-esophageal reflux disease without esophagitis: Secondary | ICD-10-CM | POA: Diagnosis present

## 2018-03-11 DIAGNOSIS — F419 Anxiety disorder, unspecified: Secondary | ICD-10-CM | POA: Diagnosis present

## 2018-03-11 DIAGNOSIS — K449 Diaphragmatic hernia without obstruction or gangrene: Secondary | ICD-10-CM | POA: Diagnosis present

## 2018-03-11 DIAGNOSIS — I10 Essential (primary) hypertension: Secondary | ICD-10-CM | POA: Diagnosis present

## 2018-03-11 DIAGNOSIS — F1010 Alcohol abuse, uncomplicated: Secondary | ICD-10-CM | POA: Diagnosis present

## 2018-03-11 DIAGNOSIS — E871 Hypo-osmolality and hyponatremia: Secondary | ICD-10-CM | POA: Diagnosis present

## 2018-03-11 DIAGNOSIS — A09 Infectious gastroenteritis and colitis, unspecified: Secondary | ICD-10-CM | POA: Diagnosis not present

## 2018-03-11 DIAGNOSIS — A040 Enteropathogenic Escherichia coli infection: Secondary | ICD-10-CM | POA: Diagnosis present

## 2018-03-11 DIAGNOSIS — K7460 Unspecified cirrhosis of liver: Secondary | ICD-10-CM | POA: Diagnosis present

## 2018-03-11 DIAGNOSIS — K644 Residual hemorrhoidal skin tags: Secondary | ICD-10-CM | POA: Diagnosis present

## 2018-03-11 DIAGNOSIS — F329 Major depressive disorder, single episode, unspecified: Secondary | ICD-10-CM | POA: Diagnosis present

## 2018-03-11 DIAGNOSIS — R1010 Upper abdominal pain, unspecified: Secondary | ICD-10-CM | POA: Diagnosis not present

## 2018-03-11 DIAGNOSIS — R197 Diarrhea, unspecified: Secondary | ICD-10-CM | POA: Diagnosis not present

## 2018-03-11 DIAGNOSIS — A045 Campylobacter enteritis: Secondary | ICD-10-CM | POA: Diagnosis present

## 2018-03-11 DIAGNOSIS — J439 Emphysema, unspecified: Secondary | ICD-10-CM | POA: Diagnosis present

## 2018-03-11 LAB — COMPREHENSIVE METABOLIC PANEL
ALBUMIN: 2.7 g/dL — AB (ref 3.5–5.0)
ALT: 41 U/L (ref 0–44)
AST: 76 U/L — ABNORMAL HIGH (ref 15–41)
Alkaline Phosphatase: 121 U/L (ref 38–126)
Anion gap: 5 (ref 5–15)
BUN: 9 mg/dL (ref 6–20)
CHLORIDE: 106 mmol/L (ref 98–111)
CO2: 18 mmol/L — AB (ref 22–32)
CREATININE: 0.59 mg/dL (ref 0.44–1.00)
Calcium: 8.3 mg/dL — ABNORMAL LOW (ref 8.9–10.3)
GFR calc Af Amer: >60 mL/min (ref >60)
Glucose, Bld: 113 mg/dL — ABNORMAL HIGH (ref 70–99)
Potassium: 3.2 mmol/L — ABNORMAL LOW (ref 3.5–5.1)
Sodium: 129 mmol/L — ABNORMAL LOW (ref 135–145)
Total Bilirubin: 2.9 mg/dL — ABNORMAL HIGH (ref 0.3–1.2)
Total Protein: 7.1 g/dL (ref 6.5–8.1)

## 2018-03-11 LAB — CBC
HCT: 35.3 % (ref 35.0–47.0)
Hemoglobin: 12.1 g/dL (ref 12.0–16.0)
MCH: 34.2 pg — ABNORMAL HIGH (ref 26.0–34.0)
MCHC: 34.2 g/dL (ref 32.0–36.0)
MCV: 99.9 fL (ref 80.0–100.0)
PLATELETS: 53 10*3/uL — AB (ref 150–440)
RBC: 3.53 MIL/uL — AB (ref 3.80–5.20)
RDW: 14.1 % (ref 11.5–14.5)
WBC: 5.7 10*3/uL (ref 3.6–11.0)

## 2018-03-11 LAB — LACTIC ACID, PLASMA
LACTIC ACID, VENOUS: 2.5 mmol/L — AB (ref 0.5–1.9)
LACTIC ACID, VENOUS: 1.3 mmol/L (ref 0.5–1.9)

## 2018-03-11 LAB — AMMONIA: AMMONIA: 88 umol/L — AB (ref 9–35)

## 2018-03-11 MED ORDER — LORAZEPAM 1 MG PO TABS
1.0000 mg | ORAL_TABLET | Freq: Four times a day (QID) | ORAL | Status: AC | PRN
  Administered 2018-03-13: 1 mg via ORAL
  Filled 2018-03-11: qty 1

## 2018-03-11 MED ORDER — RIFAXIMIN 550 MG PO TABS
550.0000 mg | ORAL_TABLET | Freq: Two times a day (BID) | ORAL | Status: DC
  Administered 2018-03-11 – 2018-03-15 (×8): 550 mg via ORAL
  Filled 2018-03-11 (×9): qty 1

## 2018-03-11 MED ORDER — LORAZEPAM 2 MG/ML IJ SOLN
1.0000 mg | Freq: Four times a day (QID) | INTRAMUSCULAR | Status: AC | PRN
  Administered 2018-03-11: 1 mg via INTRAVENOUS

## 2018-03-11 MED ORDER — LORAZEPAM 2 MG/ML IJ SOLN
0.0000 mg | Freq: Four times a day (QID) | INTRAMUSCULAR | Status: AC
  Administered 2018-03-11: 1 mg via INTRAVENOUS
  Filled 2018-03-11 (×2): qty 1

## 2018-03-11 MED ORDER — THIAMINE HCL 100 MG/ML IJ SOLN
100.0000 mg | Freq: Every day | INTRAMUSCULAR | Status: DC
  Filled 2018-03-11 (×2): qty 2

## 2018-03-11 MED ORDER — POTASSIUM CHLORIDE CRYS ER 20 MEQ PO TBCR
40.0000 meq | EXTENDED_RELEASE_TABLET | Freq: Once | ORAL | Status: AC
  Administered 2018-03-11: 40 meq via ORAL
  Filled 2018-03-11: qty 2

## 2018-03-11 MED ORDER — FOLIC ACID 1 MG PO TABS
1.0000 mg | ORAL_TABLET | Freq: Every day | ORAL | Status: DC
  Administered 2018-03-11 – 2018-03-15 (×5): 1 mg via ORAL
  Filled 2018-03-11 (×5): qty 1

## 2018-03-11 MED ORDER — ADULT MULTIVITAMIN W/MINERALS CH
1.0000 | ORAL_TABLET | Freq: Every day | ORAL | Status: DC
  Administered 2018-03-11 – 2018-03-15 (×5): 1 via ORAL
  Filled 2018-03-11 (×6): qty 1

## 2018-03-11 MED ORDER — LACTULOSE 10 GM/15ML PO SOLN
30.0000 g | Freq: Three times a day (TID) | ORAL | Status: DC
  Administered 2018-03-11: 30 g via ORAL
  Filled 2018-03-11: qty 60

## 2018-03-11 MED ORDER — VITAMIN B-1 100 MG PO TABS
100.0000 mg | ORAL_TABLET | Freq: Every day | ORAL | Status: DC
  Administered 2018-03-11 – 2018-03-15 (×5): 100 mg via ORAL
  Filled 2018-03-11 (×5): qty 1

## 2018-03-11 MED ORDER — LORAZEPAM 2 MG/ML IJ SOLN: 0.0000 mg | Freq: Two times a day (BID) | INTRAMUSCULAR | Status: AC

## 2018-03-11 NOTE — Progress Notes (Signed)
Pt is baseline alert and oriented X4. For the last hour, pt has been noted to become increasingly confused, getting out of the bed, pt claiming she "is seeing somebody on the ceiling" pointing to the light above the sink. Pt has been reoriented by multiple staff without success. Pt becoming agitated and visual hallucinations are becoming moderately severe. Dr. Bosie HelperPrasanna paged with orders made. Will start on CIWA protocol with PRN Ativan and serum ammonia level. Will continue to monitor. Side rails up and bed alarm on for safety.

## 2018-03-11 NOTE — Progress Notes (Signed)
CRITICAL VALUE ALERT  Critical value received:  Lactic acid - 2.5, Ammonia - 88  Date of notification:  03/11/18  Time of notification:  0426  Critical value read back:Yes.    Nurse who received alert:  Henrene DodgeAnessa M., RN  MD notified (1st page):  Dr. Barbaraann RondoSridharan Prasanna  Time of first page:  [phone]  MD notified (2nd page):  Time of second page:  Responding MD:  Dr. Marjie SkiffSridharan with new orders placed  Time MD responded:  [phone]

## 2018-03-11 NOTE — Progress Notes (Addendum)
[employer] - [place] at [facility]   PATIENT NAME: Susan [name]    MR#:  [id number]  DATE OF BIRTH:  Feb 25, 1971  SUBJECTIVE:  Sleeping and wakes up briefly to answer questions, then goes back to sleep. Endorses abdominal pain. Has had 7 episodes of diarrhea today. No vomiting.  REVIEW OF SYSTEMS:  Review of Systems  Constitutional: Negative for fever and weight loss.  HENT: Negative for congestion and sore throat.   Eyes: Negative for blurred vision and pain.  Respiratory: Negative for cough and shortness of breath.   Cardiovascular: Negative for chest pain and palpitations.  Gastrointestinal: Positive for abdominal pain, diarrhea and nausea. Negative for blood in stool, constipation, heartburn and vomiting.  Genitourinary: Negative for dysuria and frequency.  Musculoskeletal: Negative for back pain and myalgias.  Neurological: Negative for dizziness and headaches.  Psychiatric/Behavioral: Negative for depression. The patient is not nervous/anxious.    DRUG ALLERGIES:   Allergies  Allergen Reactions  . Doxycycline Hives  . Latex Hives  . Promethazine Diarrhea  . Ranitidine Nausea Only  . Zofran [Ondansetron Hcl] Itching   VITALS:  Blood pressure 108/65, pulse 91, temperature 98 F (36.7 C), temperature source Oral, resp. rate 17, height 5\' 3"  (1.6 m), weight 74.8 kg (165 lb), SpO2 99 %. PHYSICAL EXAMINATION:  Physical Exam  Constitutional: She is oriented to person, place, and time. She appears well-developed and well-nourished.  Sleepy, but awakens easily to voice.  HENT:  Head: Normocephalic and atraumatic.  Eyes: Pupils are equal, round, and reactive to light. Conjunctivae and EOM are normal.  Neck: Normal range of motion. Neck supple. No tracheal deviation present. No thyromegaly present.  Cardiovascular: Normal rate, regular rhythm and normal heart sounds.  Pulmonary/Chest: Effort normal and breath sounds normal. No respiratory distress. She has  no wheezes. She exhibits no tenderness.  Abdominal: Soft. Bowel sounds are normal. She exhibits no distension. There is generalized tenderness. There is no rebound and no guarding.  Musculoskeletal: Normal range of motion.  Neurological: She is alert and oriented to person, place, and time. No cranial nerve deficit.  Skin: Skin is warm and dry. No rash noted.   LABORATORY PANEL:  Female CBC Recent Labs  Lab 03/11/18 0351  WBC 5.7  HGB 12.1  HCT 35.3  PLT 53*   ------------------------------------------------------------------------------------------------------------------ Chemistries  Recent Labs  Lab 03/09/18 2031  03/11/18 0351  NA 128*  --  129*  K 2.8*   < > 3.2*  CL 100  --  106  CO2 18*  --  18*  GLUCOSE 134*  --  113*  BUN 12  --  9  CREATININE 0.61  --  0.59  CALCIUM 9.5  --  8.3*  MG 2.1  --   --   AST 93*  --  76*  ALT 51*  --  41  ALKPHOS 168*  --  121  BILITOT 3.5*  --  2.9*   < > = values in this interval not displayed.   RADIOLOGY:  Mr 3d Recon At Scanner  Result Date: 03/10/2018 CLINICAL DATA:  Abnormal LFTs.  Dilated CBD on ultrasound. EXAM: MRI ABDOMEN WITHOUT AND WITH CONTRAST (INCLUDING MRCP) TECHNIQUE: Multiplanar multisequence MR imaging of the abdomen was performed both before and after the administration of intravenous contrast. Heavily T2-weighted images of the biliary and pancreatic ducts were obtained, and three-dimensional MRCP images were rendered by post processing. CONTRAST:  15mL MULTIHANCE GADOBENATE DIMEGLUMINE 529 MG/ML IV SOLN COMPARISON:  Right upper  quadrant ultrasound dated 03/10/2018. MRI abdomen dated 04/30/2017. FINDINGS: Lower chest: Lung bases are clear. Hepatobiliary: Cirrhosis. Mild hepatic steatosis. Lace-like T2 hyperintensity throughout the liver, reflecting fibrosis. Additionally, following contrast administration, there are numerable nonenhancing nodules throughout the liver (series 21), reflecting regenerating nodules. No  enhancing foci to suggest dysplastic nodules. No suspicious/enhancing lesions suggestive of hepatocellular carcinoma. Tiny layering gallstone (series 5/image 19), without associated inflammatory changes. No intrahepatic ductal dilatation. Dilated common duct, measuring 11 mm centrally (series 10/image 10), unchanged from the prior and smoothly tapering at the ampulla. No choledocholithiasis is seen. Pancreas:  Within normal limits. Spleen:  Spleen is normal in size. Adrenals/Urinary Tract:  Adrenal glands are within normal limits. 4 mm left upper pole renal cyst (series 5/image 20). Right kidney is within normal limits. No hydronephrosis. Stomach/Bowel: Stomach is within normal limits. Mild left colonic wall thickening involving the proximal descending colon (series 10/image 13), poorly evaluated on MRI. Correlate for infectious/inflammatory colitis. Vascular/Lymphatic:  No evidence of abdominal aortic aneurysm. No suspicious abdominal lymphadenopathy. Other:  No abdominal ascites. Musculoskeletal: No focal osseous lesions. IMPRESSION: Stable dilatation of the common duct, measuring 11 mm. No choledocholithiasis is seen. Mild cholelithiasis, without associated inflammatory changes. Cirrhosis. Mild hepatic steatosis. Lace-like fibrosis and numerous regenerating nodules. No evidence of hepatocellular carcinoma. Wall thickening involving the proximal descending colon, poorly evaluated on MRI. Correlate for infectious/inflammatory colitis. Electronically Signed   By: Charline Bills M.D.   On: 03/10/2018 18:50   Mr Abdomen Mrcp Vivien Rossetti Contast  Result Date: 03/10/2018 CLINICAL DATA:  Abnormal LFTs.  Dilated CBD on ultrasound. EXAM: MRI ABDOMEN WITHOUT AND WITH CONTRAST (INCLUDING MRCP) TECHNIQUE: Multiplanar multisequence MR imaging of the abdomen was performed both before and after the administration of intravenous contrast. Heavily T2-weighted images of the biliary and pancreatic ducts were obtained, and  three-dimensional MRCP images were rendered by post processing. CONTRAST:  15mL MULTIHANCE GADOBENATE DIMEGLUMINE 529 MG/ML IV SOLN COMPARISON:  Right upper quadrant ultrasound dated 03/10/2018. MRI abdomen dated 04/30/2017. FINDINGS: Lower chest: Lung bases are clear. Hepatobiliary: Cirrhosis. Mild hepatic steatosis. Lace-like T2 hyperintensity throughout the liver, reflecting fibrosis. Additionally, following contrast administration, there are numerable nonenhancing nodules throughout the liver (series 21), reflecting regenerating nodules. No enhancing foci to suggest dysplastic nodules. No suspicious/enhancing lesions suggestive of hepatocellular carcinoma. Tiny layering gallstone (series 5/image 19), without associated inflammatory changes. No intrahepatic ductal dilatation. Dilated common duct, measuring 11 mm centrally (series 10/image 10), unchanged from the prior and smoothly tapering at the ampulla. No choledocholithiasis is seen. Pancreas:  Within normal limits. Spleen:  Spleen is normal in size. Adrenals/Urinary Tract:  Adrenal glands are within normal limits. 4 mm left upper pole renal cyst (series 5/image 20). Right kidney is within normal limits. No hydronephrosis. Stomach/Bowel: Stomach is within normal limits. Mild left colonic wall thickening involving the proximal descending colon (series 10/image 13), poorly evaluated on MRI. Correlate for infectious/inflammatory colitis. Vascular/Lymphatic:  No evidence of abdominal aortic aneurysm. No suspicious abdominal lymphadenopathy. Other:  No abdominal ascites. Musculoskeletal: No focal osseous lesions. IMPRESSION: Stable dilatation of the common duct, measuring 11 mm. No choledocholithiasis is seen. Mild cholelithiasis, without associated inflammatory changes. Cirrhosis. Mild hepatic steatosis. Lace-like fibrosis and numerous regenerating nodules. No evidence of hepatocellular carcinoma. Wall thickening involving the proximal descending colon, poorly  evaluated on MRI. Correlate for infectious/inflammatory colitis. Electronically Signed   By: Charline Bills M.D.   On: 03/10/2018 18:50   ASSESSMENT AND PLAN:  71F with known history of cirrhosis, COPD admitted for  diarrhea/AP, N/V, poor PO intake/dehydration  * N/V/Abd pain and Diarrhea: likely infectious colitis in the setting of chronic diarrhea. - GI panel (+) Campylobacter + EPEC - Treat with Azithromycin 500 mg PO daily for 3 days or until symptoms improve - tramadol prn for moderate/severe pain and dilaudid prn for breakthrough pain - trend lactic acid - hold lactulose while having diarrhea  * Hyponatremia/hypokalemia- slowly improving with IVFs - will give K-dur 40mEq x 1 today - continue IVFs for now  * Cirrhosis w/ transaminasemia, hyperbilirubinemia, hypoalbuminemia - RUQ shows extrahepatic biliary duct dilatation without identifiable obstructing etiology - MCRP with stable dilatation of the common bile duct (11cm), no choledocholithiasis, mild cholelithiasis, +wall thickening in the proximal descending colon. - holding lactulose - check cmp in the morning  * Thrombocytopenia- likely due to cirrhosis, platelets have trended down from 70 > 53 - if Plt drops below 50,000, will need to stop lovenox - consider onc consult  * Hyperglycemia - monitor blood sugar   All the records are reviewed and case discussed with Care Management/Social Worker. Management plans discussed with the patient, nursing and they are in agreement.  CODE STATUS: Full Code  TOTAL TIME TAKING CARE OF THIS PATIENT: 35 minutes.   More than 50% of the time was spent in counseling/coordination of care: YES  POSSIBLE D/C IN 1-2 DAYS, DEPENDING ON CLINICAL CONDITION and improvement in diarrhea   Jinny BlossomKaty D Delrae Hagey M.D on 03/11/2018 at 2:30 PM  Between 7am to 6pm - Pager - [phone]  After 6pm go to [email or web address] - Social research officer, governmentpassword EPAS [email or web address]  [facility] [place] [facility]  Office   [phone]  CC: Primary care physician; Sherron Mondayejan-Sie, S Ahmed, MD  Note: This dictation was prepared with Dragon dictation along with smaller phrase technology. Any transcriptional errors that result from this process are unintentional.

## 2018-03-12 LAB — CBC
HCT: 33 % — ABNORMAL LOW (ref 35.0–47.0)
HEMOGLOBIN: 11.6 g/dL — AB (ref 12.0–16.0)
MCH: 35 pg — ABNORMAL HIGH (ref 26.0–34.0)
MCHC: 35.1 g/dL (ref 32.0–36.0)
MCV: 99.7 fL (ref 80.0–100.0)
PLATELETS: 53 10*3/uL — AB (ref 150–440)
RBC: 3.31 MIL/uL — AB (ref 3.80–5.20)
RDW: 14.2 % (ref 11.5–14.5)
WBC: 5 10*3/uL (ref 3.6–11.0)

## 2018-03-12 LAB — COMPREHENSIVE METABOLIC PANEL WITH GFR
ALT: 43 U/L (ref 0–44)
AST: 93 U/L — ABNORMAL HIGH (ref 15–41)
Albumin: 2.4 g/dL — ABNORMAL LOW (ref 3.5–5.0)
Alkaline Phosphatase: 124 U/L (ref 38–126)
Anion gap: 3 — ABNORMAL LOW (ref 5–15)
BUN: <5 mg/dL — ABNORMAL LOW (ref 6–20)
CO2: 18 mmol/L — ABNORMAL LOW (ref 22–32)
Calcium: 8.2 mg/dL — ABNORMAL LOW (ref 8.9–10.3)
Chloride: 114 mmol/L — ABNORMAL HIGH (ref 98–111)
Creatinine, Ser: 0.41 mg/dL — ABNORMAL LOW (ref 0.44–1.00)
GFR calc Af Amer: >60 mL/min
GFR calc non Af Amer: >60 mL/min
Glucose, Bld: 100 mg/dL — ABNORMAL HIGH (ref 70–99)
Potassium: 3.9 mmol/L (ref 3.5–5.1)
Sodium: 135 mmol/L (ref 135–145)
Total Bilirubin: 3.7 mg/dL — ABNORMAL HIGH (ref 0.3–1.2)
Total Protein: 6.4 g/dL — ABNORMAL LOW (ref 6.5–8.1)

## 2018-03-12 MED ORDER — OXYCODONE HCL 5 MG PO TABS
5.0000 mg | ORAL_TABLET | ORAL | Status: DC | PRN
  Administered 2018-03-13 – 2018-03-15 (×5): 5 mg via ORAL
  Filled 2018-03-12 (×5): qty 1

## 2018-03-12 NOTE — Progress Notes (Signed)
PT Cancellation Note  Patient Details Name: Susan [name] MRN: [id number] DOB: 09/16/1970   Cancelled Treatment:     Order received. Chart reviewed. PT attempts eval this afternoon. Pt does not awake to verbal stimulus and requires shoulder rubbing to arouse. Pt unable to keep eyes open and falls asleep as PT is trying to ask history questions. PT will attempt at later time when pt is more appropriate and able to participate in therapy.  Ron AgeeLogan Dedra Matsuo, SPT    Ronel Rodeheaver 03/12/2018, 1:06 PM

## 2018-03-12 NOTE — Progress Notes (Addendum)
[facility] -  at [facility]   PATIENT NAME: Susan [name]    MR#:  [id number]  DATE OF BIRTH:  23-Feb-1971  SUBJECTIVE:  Patient states her abdominal pain has gotten better. Her diarrhea is slowing down. She did have some mild nausea this today, but no vomiting. She feels weak after laying in bed. Drinking okay, but not eating much.  REVIEW OF SYSTEMS:  Review of Systems  Constitutional: Negative for fever and weight loss.  HENT: Negative for congestion and sore throat.   Eyes: Negative for blurred vision and pain.  Respiratory: Negative for cough and shortness of breath.   Cardiovascular: Negative for chest pain and palpitations.  Gastrointestinal: Positive for abdominal pain, diarrhea and nausea. Negative for blood in stool, constipation, heartburn and vomiting.  Genitourinary: Negative for dysuria and frequency.  Musculoskeletal: Negative for back pain and myalgias.  Neurological: Positive for weakness. Negative for dizziness and headaches.  Psychiatric/Behavioral: Negative for depression. The patient is not nervous/anxious.    DRUG ALLERGIES:   Allergies  Allergen Reactions  . Doxycycline Hives  . Latex Hives  . Promethazine Diarrhea  . Ranitidine Nausea Only  . Zofran [Ondansetron Hcl] Itching   VITALS:  Blood pressure (!) 95/59, pulse 86, temperature 98 F (36.7 C), temperature source Oral, resp. rate 20, height 5\' 3"  (1.6 m), weight 74.8 kg (165 lb), SpO2 96 %. PHYSICAL EXAMINATION:  Physical Exam  Constitutional: She is oriented to person, place, and time. She appears well-developed and well-nourished.  HENT:  Head: Normocephalic and atraumatic.  Mouth/Throat: Mucous membranes are normal.  Eyes: Pupils are equal, round, and reactive to light. Conjunctivae and EOM are normal.  Neck: Normal range of motion. Neck supple. No tracheal deviation present. No thyromegaly present.  Cardiovascular: Normal rate, regular rhythm and normal heart  sounds.  Pulmonary/Chest: Effort normal and breath sounds normal. No respiratory distress. She has no wheezes. She exhibits no tenderness.  Abdominal: Soft. Bowel sounds are normal. She exhibits no distension. There is no tenderness. There is no rebound and no guarding.  Musculoskeletal: Normal range of motion.  Neurological: She is alert and oriented to person, place, and time. No cranial nerve deficit.  Skin: Skin is warm and dry. No rash noted.  Psychiatric: She has a normal mood and affect. Her behavior is normal.   LABORATORY PANEL:  Female CBC Recent Labs  Lab 03/12/18 0354  WBC 5.0  HGB 11.6*  HCT 33.0*  PLT 53*   ------------------------------------------------------------------------------------------------------------------ Chemistries  Recent Labs  Lab 03/09/18 2031  03/12/18 0354  NA 128*   < > 135  K 2.8*   < > 3.9  CL 100   < > 114*  CO2 18*   < > 18*  GLUCOSE 134*   < > 100*  BUN 12   < > <5*  CREATININE 0.61   < > 0.41*  CALCIUM 9.5   < > 8.2*  MG 2.1  --   --   AST 93*   < > 93*  ALT 51*   < > 43  ALKPHOS 168*   < > 124  BILITOT 3.5*   < > 3.7*   < > = values in this interval not displayed.   RADIOLOGY:  No results found. ASSESSMENT AND PLAN:  [name] with known history of cirrhosis, COPD admitted for diarrhea/AP, N/V, poor PO intake/dehydration.  * N/V/Abd pain and Diarrhea: likely infectious colitis in the setting of chronic diarrhea- diarrhea slowing down and abdominal pain  has improved. - GI panel (+) Campylobacter + EPEC - Continue Azithromycin 500 mg PO daily for 3 days or until symptoms improve - tramadol prn for moderate/severe pain and dilaudid prn for breakthrough pain - hold lactulose while having diarrhea - will stop IVFs today and encourage po intake - PT consult for generalized weakness  * Hyponatremia/hypokalemia- resolved - stop IVFs and encourage po intake  * Cirrhosis w/ transaminasemia, hyperbilirubinemia, hypoalbuminemia - RUQ  shows extrahepatic biliary duct dilatation without identifiable obstructing etiology - MCRP with stable dilatation of the common bile duct (11cm), no choledocholithiasis, mild cholelithiasis, +wall thickening in the proximal descending colon. - holding lactulose  * Thrombocytopenia- likely due to cirrhosis, platelets have trended down from 70 > 53 - if Plt drops below 50,000, will need to stop lovenox - consider onc consult  * Hyperglycemia - monitor blood sugar  All the records are reviewed and case discussed with Care Management/Social Worker. Management plans discussed with the patient, nursing and they are in agreement.  CODE STATUS: Full Code  TOTAL TIME TAKING CARE OF THIS PATIENT: 35 minutes.   More than 50% of the time was spent in counseling/coordination of care: YES  POSSIBLE D/C tomorrow, DEPENDING ON CLINICAL CONDITION   Jinny BlossomKaty D Anjana Cheek M.D on 03/12/2018 at 11:41 AM  Between 7am to 6pm - Pager - [phone]  After 6pm go to [email or web address] - Social research officer, governmentpassword EPAS [facility]  [facility] [place] [facility]  Office  [phone]  CC: Primary care physician; Sherron Mondayejan-Sie, S Ahmed, MD  Note: This dictation was prepared with Dragon dictation along with smaller phrase technology. Any transcriptional errors that result from this process are unintentional.

## 2018-03-12 NOTE — Progress Notes (Signed)
Assumed care of patient. Patient resting at this time. Will continue to monitor.

## 2018-03-13 DIAGNOSIS — D649 Anemia, unspecified: Secondary | ICD-10-CM

## 2018-03-13 DIAGNOSIS — A09 Infectious gastroenteritis and colitis, unspecified: Secondary | ICD-10-CM

## 2018-03-13 LAB — COMPREHENSIVE METABOLIC PANEL
ALT: 46 U/L — ABNORMAL HIGH (ref 0–44)
ANION GAP: 4 — AB (ref 5–15)
AST: 102 U/L — AB (ref 15–41)
Albumin: 2.4 g/dL — ABNORMAL LOW (ref 3.5–5.0)
Alkaline Phosphatase: 133 U/L — ABNORMAL HIGH (ref 38–126)
CO2: 19 mmol/L — ABNORMAL LOW (ref 22–32)
Calcium: 8.2 mg/dL — ABNORMAL LOW (ref 8.9–10.3)
Chloride: 108 mmol/L (ref 98–111)
Creatinine, Ser: 0.39 mg/dL — ABNORMAL LOW (ref 0.44–1.00)
Glucose, Bld: 98 mg/dL (ref 70–99)
POTASSIUM: 3.2 mmol/L — AB (ref 3.5–5.1)
Sodium: 131 mmol/L — ABNORMAL LOW (ref 135–145)
Total Bilirubin: 3.3 mg/dL — ABNORMAL HIGH (ref 0.3–1.2)
Total Protein: 6.3 g/dL — ABNORMAL LOW (ref 6.5–8.1)

## 2018-03-13 LAB — CBC
HCT: 31.1 % — ABNORMAL LOW (ref 35.0–47.0)
Hemoglobin: 11 g/dL — ABNORMAL LOW (ref 12.0–16.0)
MCH: 35.1 pg — ABNORMAL HIGH (ref 26.0–34.0)
MCHC: 35.2 g/dL (ref 32.0–36.0)
MCV: 99.5 fL (ref 80.0–100.0)
PLATELETS: 50 10*3/uL — AB (ref 150–440)
RBC: 3.13 MIL/uL — ABNORMAL LOW (ref 3.80–5.20)
RDW: 14.2 % (ref 11.5–14.5)
WBC: 4.5 10*3/uL (ref 3.6–11.0)

## 2018-03-13 MED ORDER — SODIUM CHLORIDE 0.9 % IV SOLN
INTRAVENOUS | Status: DC
  Administered 2018-03-13 – 2018-03-14 (×3): via INTRAVENOUS
  Filled 2018-03-13: qty 1000

## 2018-03-13 MED ORDER — POTASSIUM CHLORIDE CRYS ER 20 MEQ PO TBCR
40.0000 meq | EXTENDED_RELEASE_TABLET | Freq: Two times a day (BID) | ORAL | Status: DC
  Administered 2018-03-13 – 2018-03-15 (×5): 40 meq via ORAL
  Filled 2018-03-13 (×5): qty 2

## 2018-03-13 NOTE — Plan of Care (Signed)
  Problem: Pain Managment: Goal: General experience of comfort will improve Outcome: Progressing   Problem: Safety: Goal: Ability to remain free from injury will improve Outcome: Progressing   

## 2018-03-13 NOTE — Consult Note (Signed)
Susan Antigua, MD [address], [place], [place], [place], [address] [id number], [place], [place], [place], [address] Phone: [phone]  Fax: [phone]  Consultation  Referring Provider:     Dr. Brett Albino Primary Care Physician:  Jodi Marble, MD Reason for Consultation:     Diarrhea and drop in hemoglobin Primary gastroenterologist: Dr. Gustavo Lah  Date of Admission:  03/09/2018 Date of Consultation:  03/13/2018         HPI:   Susan [name] is a 47 y.o. female with history of hep C, genotype 3, cirrhosis, alcohol abuse, noncompliant to treatment.  Patient presents with abdominal pain, and 3 to 4-week history of diarrhea.  4-6 liquid bowel movements daily.  No blood in stool.  No nausea or vomiting.  Stool studies positive for Campylobacter and EPEC.  Endorses abdominal pain, diffusely.  Nonradiating, 5/10, cramping.  Does not worsen with meals.  Lab work showed hemoglobin dropped to 11 with baseline of 13-15.  MCV normal. Elevated liver enzymes with total bilirubin of 3.3, alk phos 133, AST 102, ALT 46.  He has chronically elevated liver enzymes in the past as well.  MRCP showed stable dilation of the common bile duct measuring 11 mm, with no choledocholithiasis.  Cirrhosis noted.  As per her primary gastroenteritis, Dr. Marton Redwood clinic note "Her last EGD and colonoscopy was 8/1 18 2014. Her EGD showed evidence of portal gastropathy but no esophageal varices. There were no gastric varices. She has a small hiatal hernia. A small tubular adenoma was also removed from the colon. Procedures otherwise normal."      Past Medical History:  Diagnosis Date  . Allergy   . Anemia   . Anxiety   . Asthma   . Cirrhosis ([place])   . COPD (chronic obstructive pulmonary disease) ([place]) 12/09/2014  . Depression   . Emphysema of lung ([place])   . GERD (gastroesophageal reflux disease)   . Heart murmur   . Hepatitis C    pt said they told her she had this last visit here  .  Hyperlipidemia   . Hypertension     Past Surgical History:  Procedure Laterality Date  . BREAST CYST ASPIRATION Right 12/2017   abscess aspiration  . CESAREAN SECTION  2009    Prior to Admission medications   Medication Sig Start Date End Date Taking? Authorizing Provider  albuterol (PROVENTIL HFA;VENTOLIN HFA) 108 (90 Base) MCG/ACT inhaler Inhale 1 puff into the lungs every 4 (four) hours as needed for wheezing or shortness of breath. 05/04/17  Yes Dustin Flock, MD  aluminum-magnesium hydroxide-simethicone (MAALOX) 200-200-20 MG/5ML SUSP Take 30 mLs 4 (four) times daily -  before meals and at bedtime by mouth. 06/15/17  Yes Carrie Mew, MD  budesonide-formoterol [facility]) 80-4.5 MCG/ACT inhaler Inhale 2 puffs into the lungs 2 (two) times daily. 05/04/17  Yes Dustin Flock, MD  clonazePAM (KLONOPIN) 1 MG tablet Take 1 tablet (1 mg total) by mouth 2 (two) times daily. 05/04/17  Yes Dustin Flock, MD  escitalopram (LEXAPRO) 20 MG tablet Take 1 tablet (20 mg total) by mouth at bedtime. 05/04/17  Yes Dustin Flock, MD  furosemide (LASIX) 40 MG tablet Take 1 tablet (40 mg total) by mouth 2 (two) times daily. 05/04/17  Yes Dustin Flock, MD  hydrOXYzine (ATARAX/VISTARIL) 25 MG tablet Take 1 tablet (25 mg total) by mouth 3 (three) times daily as needed (itching). 04/07/15  Yes Sudini, Alveta Heimlich, MD  ipratropium-albuterol (DUONEB) 0.5-2.5 (3) MG/3ML SOLN Take 3 mLs  by nebulization every 6 (six) hours as needed (for shortness of breath/wheezing).    Yes [provider]  loratadine (CLARITIN) 10 MG tablet Take 1 tablet (10 mg total) by mouth daily. 05/04/17  Yes Dustin Flock, MD  metoCLOPramide (REGLAN) 10 MG tablet Take 1 tablet (10 mg total) every 6 (six) hours as needed by mouth. 06/15/17  Yes Carrie Mew, MD  pantoprazole (PROTONIX) 40 MG tablet Take 1 tablet (40 mg total) by mouth daily. 10/29/17 03/10/18 Yes Gregor Hams, MD  Potassium Chloride ER 20 MEQ TBCR Take 40  mEq by mouth daily. 05/04/17  Yes Dustin Flock, MD  spironolactone (ALDACTONE) 100 MG tablet Take 1 tablet (100 mg total) by mouth daily. 05/04/17  Yes Dustin Flock, MD    Family History  Problem Relation Age of Onset  . Other Mother   . Lung cancer Mother   . Skin cancer Father   . Breast cancer Maternal Grandmother 25     Social History   Tobacco Use  . Smoking status: Current Every Day Smoker    Packs/day: 1.00    Years: 20.00    Pack years: 20.00    Types: Cigarettes  . Smokeless tobacco: Never Used  Substance Use Topics  . Alcohol use: No    Comment: Quit drinking few years ago.  . Drug use: No    Allergies as of 03/09/2018 - Review Complete 03/09/2018  Allergen Reaction Noted  . Doxycycline Hives 12/09/2014  . Latex Hives 12/09/2014  . Promethazine Diarrhea 12/09/2014  . Ranitidine Nausea Only 12/09/2014  . Zofran [ondansetron hcl] Itching 12/09/2014    Review of Systems:    All systems reviewed and negative except where noted in HPI.   Physical Exam:  Vital signs in last 24 hours: Vitals:   03/11/18 2359 03/12/18 0826 03/13/18 0022 03/13/18 0746  BP: (!) 95/55 (!) 95/59 (!) 93/58 (!) 102/54  Pulse: 82 86 96 88  Resp: 18 20 14 18   Temp: 98.6 F (37 C) 98 F (36.7 C) 99.6 F (37.6 C) 98 F (36.7 C)  TempSrc:  Oral Oral Oral  SpO2: 97% 96% 97% 100%  Weight:      Height:       Last BM Date: 03/12/18 General:   Pleasant, cooperative in NAD Head:  Normocephalic and atraumatic. Eyes:   No icterus.   Conjunctiva pink. PERRLA. Ears:  Normal auditory acuity. Neck:  Supple; no masses or thyroidomegaly Lungs: Respirations even and unlabored. Lungs clear to auscultation bilaterally.   No wheezes, crackles, or rhonchi.  Abdomen:  Soft, nondistended, nontender. Normal bowel sounds. No appreciable masses or hepatomegaly.  No rebound or guarding.  Neurologic:  Alert and oriented x3;  grossly normal neurologically. Skin:  Intact without significant lesions or  rashes. Cervical Nodes:  No significant cervical adenopathy. Psych:  Alert and cooperative. Normal affect.  LAB RESULTS: Recent Labs    03/11/18 0351 03/12/18 0354 03/13/18 0436  WBC 5.7 5.0 4.5  HGB 12.1 11.6* 11.0*  HCT 35.3 33.0* 31.1*  PLT 53* 53* 50*   BMET Recent Labs    03/11/18 0351 03/12/18 0354 03/13/18 0436  NA 129* 135 131*  K 3.2* 3.9 3.2*  CL 106 114* 108  CO2 18* 18* 19*  GLUCOSE 113* 100* 98  BUN 9 <5* <5*  CREATININE 0.59 0.41* 0.39*  CALCIUM 8.3* 8.2* 8.2*   LFT Recent Labs    03/13/18 0436  PROT 6.3*  ALBUMIN 2.4*  AST 102*  ALT 46*  ALKPHOS 133*  BILITOT 3.3*   PT/INR No results for input(s): LABPROT, INR in the last 72 hours.  STUDIES: No results found.    Impression / Plan:   RUBYE STROHMEYER is a 47 y.o. y/o female with history of alcohol abuse, hep C, noncompliant to treatment, followed by [facility] clinic GI, admitted with abdominal pain and diarrhea with ongoing alcohol abuse   Diarrhea due to infection from Campylobacter and E. coli, and source of abdominal pain Patient continues to improve from diarrhea standpoint Oral and hand hygiene encouraged  Abstinence from alcohol encouraged again  Drop in hemoglobin is not accompanied by any signs of active GI bleeding at this time No indication for endoscopy at this time Drop in hemoglobin likely due to dilution effect  Elevated liver enzymes are chronic Would recommend repeating CMP with morning labs MRCP did not show any evidence of CBD obstruction Avoid hepatotoxic drugs  CIWA protocol  folate, thiamine Follow-up in GI clinic with Dr. Gustavo Lah as an outpatient to discuss hep C treatment and timeline of her repeat EGD for variceal surveillance given her ongoing alcohol abuse  GI service will sign off at this time Please page with any questions, or with any signs of active GI bleeding or further drop in hemoglobin  Thank you for involving me in the care of this patient.       LOS: 2 days   Virgel Manifold, MD  03/13/2018, 4:26 PM

## 2018-03-13 NOTE — Evaluation (Signed)
Physical Therapy Evaluation Patient Details Name: Susan [name] MRN: [id number] DOB: 1971-06-30 Today's Date: 03/13/2018   History of Present Illness  Pt presents to hospital on 03/09/18 and subseqently diagnosed with nausea, vomiting, abdominal pain and diarrhea as well as hypokalemia and hyponatremia. Pt has a past medical history that includes anemeia, anxiety, cirrhosis, COPD, depression, emphysema, GERD, hep C, HTN, and hyperlipedemia    Clinical Impression  Pt is a pleasant 47 year old female who was admitted for nausea, vomiting, abdominal pain and diarrhea as well as hypokalemia and hyponatremia. Pt performs bed mobility, transfers, and ambulation with mod I to supervision. Pt demonstrates deficits with mobility, endurance and pain. Pt amb 3' with hand held assist and close supervision, pt fatigues with amb. Pt complains of lightheadedness throughout session that worsens with change in position. Orthostatic BP taken (see flow sheet for details). O2 saturation WNL throughout. Pt complains of 8/10 abdominal pain throughout session. Pt demonstrates WFL strength that is equal bilaterally of both upper and lower extremities. Pt is mostly limited due to lightheadedness however also demonstrates poor endurance. Pt could benefit from continued skilled therapy at this time to improve deficits toward PLOF. PT will continue to work with pt at least 2x/week while admitted. D/c recommendations at this time are home with home health PT.     Follow Up Recommendations Home health PT    Equipment Recommendations  None recommended by PT(Has required equipment)    Recommendations for Other Services       Precautions / Restrictions Precautions Precautions: None Restrictions Weight Bearing Restrictions: No      Mobility  Bed Mobility Overal bed mobility: Modified Independent             General bed mobility comments: Pt able to perform without physical assistance however requires increased UE  support, time and effort to perform  Transfers Overall transfer level: Modified independent Equipment used: 1 person hand held assist             General transfer comment: Pt able to transfer sit<>stand with modified independence requiring increased time, UE support and effort to perform  Ambulation/Gait Ambulation/Gait assistance: Supervision Gait Distance (Feet): 3 Feet Assistive device: 1 person hand held assist Gait Pattern/deviations: Shuffle     General Gait Details: Pt amb 3' with slow shuffling gait with hand held assistance and close supervision. Pt does not have any LOB during amb. Further amb declinded at this time due to pts lightheadedness.  Stairs            Wheelchair Mobility    Modified Rankin (Stroke Patients Only)       Balance Overall balance assessment: Needs assistance   Sitting balance-Leahy Scale: Good Sitting balance - Comments: Pt able to sit EOB without UE support and no LOB     Standing balance-Leahy Scale: Fair Standing balance comment: Pt able to stand with intermittent UE support and no LOB, requires single UE support for amb                             Pertinent Vitals/Pain Pain Assessment: 0-10 Pain Score: 8  Pain Location: abdomen Pain Descriptors / Indicators: Sharp;Shooting Pain Intervention(s): Limited activity within patient's tolerance;Monitored during session;Repositioned    Home Living Family/patient expects to be discharged to:: Private residence Living Arrangements: Children;Spouse/significant other Available Help at Discharge: Family;Available PRN/intermittently Type of Home: House Home Access: Stairs to enter Entrance Stairs-Rails: None Entrance Stairs-Number of  Steps: 4 Home Layout: One level Home Equipment: Bedside commode;Cane - single point      Prior Function Level of Independence: Independent with assistive device(s)         Comments: Pt states that she was independent with  occasional SPC use when she "felt weak." Pt states that she is able to go out in the community but does so very rarely. Pt reports no falls.     Hand Dominance        Extremity/Trunk Assessment   Upper Extremity Assessment Upper Extremity Assessment: Overall WFL for tasks assessed(Grossly at least 4/5 including shld flex,elbow flex/ext,grip)    Lower Extremity Assessment Lower Extremity Assessment: Overall WFL for tasks assessed(Grossly at least 4/5 hip flex, knee flex/ext, DF)       Communication   Communication: No difficulties  Cognition Arousal/Alertness: Awake/alert Behavior During Therapy: WFL for tasks assessed/performed Overall Cognitive Status: Within Functional Limits for tasks assessed                                 General Comments: A & O x4. Pt lethargic at beginning of session which improves with activity      General Comments      Exercises Other Exercises Other Exercises: Pt instructed in and tolerates ther-ex well including SLR, LAQ, seated marching, and standing marching x10 bilaterally   Assessment/Plan    PT Assessment Patient needs continued PT services  PT Problem List Decreased strength;Decreased range of motion;Decreased activity tolerance;Decreased balance;Decreased mobility;Pain;Decreased coordination       PT Treatment Interventions DME instruction;Gait training;Stair training;Therapeutic exercise;Therapeutic activities;Functional mobility training;Balance training;Neuromuscular re-education    PT Goals (Current goals can be found in the Care Plan section)  Acute Rehab PT Goals Patient Stated Goal: to stop hurting and go home PT Goal Formulation: With patient Time For Goal Achievement: 03/27/18 Potential to Achieve Goals: Good    Frequency Min 2X/week   Barriers to discharge        Co-evaluation               AM-PAC PT "6 Clicks" Daily Activity  Outcome Measure Difficulty turning over in bed (including  adjusting bedclothes, sheets and blankets)?: A Little Difficulty moving from lying on back to sitting on the side of the bed? : A Lot Difficulty sitting down on and standing up from a chair with arms (e.g., wheelchair, bedside commode, etc,.)?: A Little Help needed moving to and from a bed to chair (including a wheelchair)?: A Little Help needed walking in hospital room?: A Little Help needed climbing 3-5 steps with a railing? : A Lot 6 Click Score: 16    End of Session Equipment Utilized During Treatment: Gait belt Activity Tolerance: Patient tolerated treatment well(limited by being light headed) Patient left: in chair;with call bell/phone within reach;with chair alarm set Nurse Communication: Mobility status;Other (comment)(Needing a call bell, phone within reach) PT Visit Diagnosis: Unsteadiness on feet (R26.81);Other abnormalities of gait and mobility (R26.89);Muscle weakness (generalized) (M62.81);Difficulty in walking, not elsewhere classified (R26.2);Pain Pain - Right/Left: (both) Pain - part of body: (abdomen)    Time: [id number] PT Time Calculation (min) (ACUTE ONLY): 24 min   Charges:              Ron AgeeLogan Sabryna Lahm, SPT   Alvey Brockel 03/13/2018, 12:40 PM

## 2018-03-13 NOTE — Progress Notes (Addendum)
[facility] [place] [facility]   PATIENT NAME: Susan [name]    MR#:  [id number]  DATE OF BIRTH:  03/19/1971  SUBJECTIVE:  Having continued diarrhea and abdominal pain. Patient states she is still not eating or drinking much. Denies nausea or vomiting. Has noticed some blood in the stools.  REVIEW OF SYSTEMS:  Review of Systems  Constitutional: Positive for malaise/fatigue. Negative for fever and weight loss.  HENT: Negative for congestion and sore throat.   Eyes: Negative for blurred vision and pain.  Respiratory: Negative for cough and shortness of breath.   Cardiovascular: Negative for chest pain and palpitations.  Gastrointestinal: Positive for abdominal pain and diarrhea. Negative for blood in stool, constipation, heartburn, nausea and vomiting.  Genitourinary: Negative for dysuria and frequency.  Musculoskeletal: Negative for back pain and myalgias.  Neurological: Positive for weakness. Negative for dizziness and headaches.  Psychiatric/Behavioral: Negative for depression. The patient is not nervous/anxious.    DRUG ALLERGIES:   Allergies  Allergen Reactions  . Doxycycline Hives  . Latex Hives  . Promethazine Diarrhea  . Ranitidine Nausea Only  . Zofran [Ondansetron Hcl] Itching   VITALS:  Blood pressure (!) 102/54, pulse 88, temperature 98 F (36.7 C), temperature source Oral, resp. rate 18, height 5\' 3"  (1.6 m), weight 74.8 kg (165 lb), SpO2 100 %. PHYSICAL EXAMINATION:  Physical Exam  Constitutional: She is oriented to person, place, and time. She appears well-developed and well-nourished.  HENT:  Head: Normocephalic and atraumatic.  Dry mucous membranes  Eyes: Pupils are equal, round, and reactive to light. Conjunctivae and EOM are normal.  Neck: Normal range of motion. Neck supple. No tracheal deviation present. No thyromegaly present.  Cardiovascular: Normal rate, regular rhythm and normal heart sounds.  Pulmonary/Chest: Effort normal  and breath sounds normal. No respiratory distress. She has no wheezes. She exhibits no tenderness.  Abdominal: Soft. Bowel sounds are normal. She exhibits no distension. There is no rebound and no guarding.  Generalized abdominal tenderness present  Musculoskeletal: Normal range of motion.  Neurological: She is alert and oriented to person, place, and time. No cranial nerve deficit.  Skin: Skin is warm and dry. No rash noted.  Psychiatric: She has a normal mood and affect. Her behavior is normal.   LABORATORY PANEL:  Female CBC Recent Labs  Lab 03/13/18 0436  WBC 4.5  HGB 11.0*  HCT 31.1*  PLT 50*   ------------------------------------------------------------------------------------------------------------------ Chemistries  Recent Labs  Lab 03/09/18 2031  03/13/18 0436  NA 128*   < > 131*  K 2.8*   < > 3.2*  CL 100   < > 108  CO2 18*   < > 19*  GLUCOSE 134*   < > 98  BUN 12   < > <5*  CREATININE 0.61   < > 0.39*  CALCIUM 9.5   < > 8.2*  MG 2.1  --   --   AST 93*   < > 102*  ALT 51*   < > 46*  ALKPHOS 168*   < > 133*  BILITOT 3.5*   < > 3.3*   < > = values in this interval not displayed.   RADIOLOGY:  No results found. ASSESSMENT AND PLAN:  77F with known history of cirrhosis, COPD admitted for diarrhea/AP, N/V, poor PO intake/dehydration.  * N/V/Abd pain and Diarrhea: likely infectious colitis in the setting of chronic diarrhea- still having persistent diarrhea and abdominal pain that has not improved with supportive management -  GI consulted today, given persistence of symptoms - GI panel (+) Campylobacter + EPEC - Continue Azithromycin 500 mg PO daily until symptoms improve - tramadol prn for moderate/severe pain and dilaudid prn for breakthrough pain - hold lactulose while having diarrhea - restart IVFs today, as patient appears mildly dehydrated - PT consult for generalized weakness  *Normocytic anemia- Hgb dropped from 15.0 on admission to 11.0 today. Likely  due to some GI bleeding. May be dilutional component, as all cell lines have decreased. - FOBT - GI consult - Hold lovenox and start SCDs  * Hyponatremia/hypokalemia- likely related to dehydration - restart IVFs - K repleted  * Cirrhosis w/ transaminasemia, hyperbilirubinemia, hypoalbuminemia - RUQ shows extrahepatic biliary duct dilatation without identifiable obstructing etiology - MCRP with stable dilatation of the common bile duct (11cm), no choledocholithiasis, mild cholelithiasis, +wall thickening in the proximal descending colon. - holding lactulose - GI consult  * Thrombocytopenia- likely due to cirrhosis, platelets have trended down from 70 > 53 > 50 - hold lovenox and start SCDs - consider onc consult  * Hyperglycemia - monitor blood sugar  All the records are reviewed and case discussed with Care Management/Social Worker. Management plans discussed with the patient, nursing and they are in agreement.  CODE STATUS: Full Code  TOTAL TIME TAKING CARE OF THIS PATIENT: 35 minutes.   More than 50% of the time was spent in counseling/coordination of care: YES  POSSIBLE D/C in 1-2 days, DEPENDING ON CLINICAL CONDITION   Jinny Blossom Mayo M.D on 03/13/2018 at 1:31 PM  Between 7am to 6pm - Pager - [phone]  After 6pm go to [email or web address] - Social research officer, government  [facility] [place] [facility]  Office  [phone]  CC: Primary care physician; Susan Monday, MD  Note: This dictation was prepared with Dragon dictation along with smaller phrase technology. Any transcriptional errors that result from this process are unintentional.

## 2018-03-14 DIAGNOSIS — R197 Diarrhea, unspecified: Secondary | ICD-10-CM

## 2018-03-14 DIAGNOSIS — K921 Melena: Secondary | ICD-10-CM

## 2018-03-14 LAB — CBC
HEMATOCRIT: 30.4 % — AB (ref 35.0–47.0)
HEMOGLOBIN: 10.6 g/dL — AB (ref 12.0–16.0)
MCH: 34.7 pg — AB (ref 26.0–34.0)
MCHC: 34.9 g/dL (ref 32.0–36.0)
MCV: 99.5 fL (ref 80.0–100.0)
Platelets: 56 10*3/uL — ABNORMAL LOW (ref 150–440)
RBC: 3.06 MIL/uL — ABNORMAL LOW (ref 3.80–5.20)
RDW: 14.5 % (ref 11.5–14.5)
WBC: 4.4 10*3/uL (ref 3.6–11.0)

## 2018-03-14 LAB — BASIC METABOLIC PANEL
Anion gap: 4 — ABNORMAL LOW (ref 5–15)
BUN: <5 mg/dL — ABNORMAL LOW (ref 6–20)
CO2: 20 mmol/L — AB (ref 22–32)
Calcium: 7.8 mg/dL — ABNORMAL LOW (ref 8.9–10.3)
Chloride: 108 mmol/L (ref 98–111)
Creatinine, Ser: 0.41 mg/dL — ABNORMAL LOW (ref 0.44–1.00)
GFR calc Af Amer: >60 mL/min (ref >60)
GFR calc non Af Amer: >60 mL/min (ref >60)
GLUCOSE: 114 mg/dL — AB (ref 70–99)
Potassium: 3.6 mmol/L (ref 3.5–5.1)
Sodium: 132 mmol/L — ABNORMAL LOW (ref 135–145)

## 2018-03-14 NOTE — Progress Notes (Signed)
[facility] [place] [facility]   PATIENT NAME: Susan [name]    MR#:  [id number]  DATE OF BIRTH:  09/05/1970  SUBJECTIVE:  Patient had a bloody bowel movement this morning. Still having abdominal pain. Diarrhea has slowed down. No vomiting or diarrhea.  REVIEW OF SYSTEMS:  Review of Systems  Constitutional: Positive for malaise/fatigue. Negative for fever and weight loss.  HENT: Negative for congestion and sore throat.   Eyes: Negative for blurred vision and pain.  Respiratory: Negative for cough and shortness of breath.   Cardiovascular: Negative for chest pain and palpitations.  Gastrointestinal: Positive for abdominal pain, blood in stool and diarrhea. Negative for constipation, heartburn, nausea and vomiting.  Genitourinary: Negative for dysuria and frequency.  Musculoskeletal: Negative for back pain and myalgias.  Neurological: Positive for weakness. Negative for dizziness and headaches.  Psychiatric/Behavioral: Negative for depression. The patient is not nervous/anxious.    DRUG ALLERGIES:   Allergies  Allergen Reactions  . Doxycycline Hives  . Latex Hives  . Promethazine Diarrhea  . Ranitidine Nausea Only  . Zofran [Ondansetron Hcl] Itching   VITALS:  Blood pressure 105/72, pulse 76, temperature 98.5 F (36.9 C), temperature source Oral, resp. rate (!) 21, height 5\' 3"  (1.6 m), weight 74.8 kg, SpO2 96 %. PHYSICAL EXAMINATION:  Physical Exam  Constitutional: She is oriented to person, place, and time. She appears well-developed and well-nourished.  HENT:  Head: Normocephalic and atraumatic.  Moist mucous membranes  Eyes: Pupils are equal, round, and reactive to light. Conjunctivae and EOM are normal.  Neck: Normal range of motion. Neck supple. No tracheal deviation present. No thyromegaly present.  Cardiovascular: Normal rate, regular rhythm and normal heart sounds.  Pulmonary/Chest: Effort normal and breath sounds normal. No respiratory  distress. She has no wheezes. She exhibits no tenderness.  Abdominal: Soft. Bowel sounds are normal. She exhibits no distension. There is no rebound and no guarding.  Generalized abdominal tenderness present  Genitourinary:  Genitourinary Comments: Right perianal area with induration that is draining purulent material and is tender to palpation.  Musculoskeletal: Normal range of motion.  Neurological: She is alert and oriented to person, place, and time. No cranial nerve deficit.  Skin: Skin is warm and dry. No rash noted.  Psychiatric: She has a normal mood and affect. Her behavior is normal.   LABORATORY PANEL:  Female CBC Recent Labs  Lab 03/14/18 0439  WBC 4.4  HGB 10.6*  HCT 30.4*  PLT 56*   ------------------------------------------------------------------------------------------------------------------ Chemistries  Recent Labs  Lab 03/09/18 2031  03/13/18 0436 03/14/18 0439  NA 128*   < > 131* 132*  K 2.8*   < > 3.2* 3.6  CL 100   < > 108 108  CO2 18*   < > 19* 20*  GLUCOSE 134*   < > 98 114*  BUN 12   < > <5* <5*  CREATININE 0.61   < > 0.39* 0.41*  CALCIUM 9.5   < > 8.2* 7.8*  MG 2.1  --   --   --   AST 93*   < > 102*  --   ALT 51*   < > 46*  --   ALKPHOS 168*   < > 133*  --   BILITOT 3.5*   < > 3.3*  --    < > = values in this interval not displayed.   RADIOLOGY:  No results found. ASSESSMENT AND PLAN:  46F with known history of cirrhosis, COPD  admitted for diarrhea/AP, N/V, poor PO intake/dehydration.  * N/V/Abd pain and bloody diarrhea: likely infectious colitis in the setting of chronic diarrhea- diarrhea slowing down, had 1 episode of bloody diarrhea in the last 24 hours. - GI consulted- believe bleeding is coming from tender perianal area or external hemorrhoids, will need outpatient GI follow-up - GI panel (+) Campylobacter + EPEC - Continue Azithromycin 500 mg PO daily until symptoms improve - tramadol prn for moderate/severe pain and dilaudid prn for  breakthrough pain - hold lactulose while having diarrhea - stop IVFs - PT consult- recommend HHPT  *Perianal skin breakdown/induration- right side of perianal area with an area of induration that is draining. Area is not well circumscribed, so do not think I&D is necessary at this time. Would like to hold off on antibiotics due to diarrhea. No signs of systemic infection. - warm compresses - consider surgery consult  *Normocytic anemia- Hgb dropped from 15.0 on admission to 10.6 today. Likely due to some GI bleeding. - GI consult- recommending outpatient f/u - Hold lovenox and start SCDs  * Hyponatremia/hypokalemia- resolved - monitor  * Cirrhosis w/ transaminasemia, hyperbilirubinemia, hypoalbuminemia - RUQ shows extrahepatic biliary duct dilatation without identifiable obstructing etiology - MCRP with stable dilatation of the common bile duct (11cm), no choledocholithiasis, mild cholelithiasis, +wall thickening in the proximal descending colon. - holding lactulose - GI consult  * Thrombocytopenia- likely due to cirrhosis, platelets have trended down from 70 > 53 > 50 - hold lovenox, continue SCDs - consider onc consult  * Hyperglycemia - monitor blood sugar  All the records are reviewed and case discussed with Care Management/Social Worker. Management plans discussed with the patient, nursing and they are in agreement.  CODE STATUS: Full Code  TOTAL TIME TAKING CARE OF THIS PATIENT: 35 minutes.   More than 50% of the time was spent in counseling/coordination of care: YES  POSSIBLE D/C tomorrow, pending stabilization of Hgb   Jinny Blossom Mayo M.D on 03/14/2018 at 4:01 PM  Between 7am to 6pm - Pager - [phone]  After 6pm go to [email or web address] - Social research officer, government  [facility] [place] [facility]  Office  [phone]  CC: Primary care physician; Susan Monday, MD  Note: This dictation was prepared with Dragon dictation along with smaller phrase technology.  Any transcriptional errors that result from this process are unintentional.

## 2018-03-14 NOTE — Consult Note (Signed)
Surgical Consultation  03/14/2018  Susan [name] is an 47 y.o. female.   Referring Physician: Sie  DQ:[id number]  HPI: I was asked see this patient for hematochezia.  She has had ongoing diarrhea.  GI consult felt that there were some external hemorrhoids that may be the cause of some of this bleeding but what was described may be involving the colon itself as it was more bloody than hemorrhoids.  Of note the patient is cirrhotic with thrombocytopenia.  Patient does not answer questions very coherently and for that reason review of systems is not very accurate and not obtainable completely.  Past Medical History:  Diagnosis Date  . Allergy   . Anemia   . Anxiety   . Asthma   . Cirrhosis ([place])   . COPD (chronic obstructive pulmonary disease) ([place]) 12/09/2014  . Depression   . Emphysema of lung ([place])   . GERD (gastroesophageal reflux disease)   . Heart murmur   . Hepatitis C    pt said they told her she had this last visit here  . Hyperlipidemia   . Hypertension     Past Surgical History:  Procedure Laterality Date  . BREAST CYST ASPIRATION Right 12/2017   abscess aspiration  . CESAREAN SECTION  2009    Family History  Problem Relation Age of Onset  . Other Mother   . Lung cancer Mother   . Skin cancer Father   . Breast cancer Maternal Grandmother 60    Social History:  reports that she has been smoking cigarettes. She has a 20.00 pack-year smoking history. She has never used smokeless tobacco. She reports that she does not drink alcohol or use drugs.  Allergies:  Allergies  Allergen Reactions  . Doxycycline Hives  . Latex Hives  . Promethazine Diarrhea  . Ranitidine Nausea Only  . Zofran [Ondansetron Hcl] Itching    Medications reviewed.   Review of Systems:   Review of Systems  Unable to perform ROS: Medical condition     Physical Exam:  BP 105/72 (BP Location: Right Arm)   Pulse 76   Temp 98.5 F (36.9 C) (Oral)   Resp (!) 21   Ht _0   (1.6 m)   Wt 74.8 kg   SpO2 96%   BMI 29.23 kg/m   Physical Exam  Constitutional: She appears toxic. She does not appear ill.  Does not answer questions appropriately and smiles but does not give forthcoming information.  Eyes: Scleral icterus is present.  Appears icteric and jaundiced.  Pulmonary/Chest: Effort normal. No respiratory distress.  Abdominal: She exhibits distension. There is no tenderness.  Genitourinary: Rectal exam shows tenderness. Rectal exam shows no mass.  Genitourinary Comments: Perianal area with some excoriations.  No induration no mass and minimally tender especially on the left lateral.  Discrete abscess.  No fluctuance.  No stool for testing.  External hemorrhoidal tags present but no thrombosis.  Skin:  Appears jaundiced  Vitals reviewed.     Results for orders placed or performed during the hospital encounter of 03/09/18 (from the past 48 hour(s))  CBC     Status: Abnormal   Collection Time: 03/13/18  4:36 AM  Result Value Ref Range   WBC 4.5 3.6 - 11.0 K/uL   RBC 3.13 (L) 3.80 - 5.20 MIL/uL   Hemoglobin 11.0 (L) 12.0 - 16.0 g/dL   HCT 31.1 (L) 35.0 - 47.0 %   MCV 99.5 80.0 - 100.0 fL   MCH 35.1 (H) 26.0 -  34.0 pg   MCHC 35.2 32.0 - 36.0 g/dL   RDW 14.2 11.5 - 14.5 %   Platelets 50 (L) 150 - 440 K/uL    Comment: Performed at [facility], [address] [place], [place] [address]  Comprehensive metabolic panel     Status: Abnormal   Collection Time: 03/13/18  4:36 AM  Result Value Ref Range   Sodium 131 (L) 135 - 145 mmol/L   Potassium 3.2 (L) 3.5 - 5.1 mmol/L   Chloride 108 98 - 111 mmol/L   CO2 19 (L) 22 - 32 mmol/L   Glucose, Bld 98 70 - 99 mg/dL   BUN <5 (L) 6 - 20 mg/dL   Creatinine, Ser 0.39 (L) 0.44 - 1.00 mg/dL   Calcium 8.2 (L) 8.9 - 10.3 mg/dL   Total Protein 6.3 (L) 6.5 - 8.1 g/dL   Albumin 2.4 (L) 3.5 - 5.0 g/dL   AST 102 (H) 15 - 41 U/L   ALT 46 (H) 0 - 44 U/L   Alkaline Phosphatase 133 (H) 38 - 126 U/L   Total  Bilirubin 3.3 (H) 0.3 - 1.2 mg/dL   GFR calc non Af Amer >60 >60 mL/min   GFR calc Af Amer >60 >60 mL/min    Comment: (NOTE) The eGFR has been calculated using the CKD EPI equation. This calculation has not been validated in all clinical situations. eGFR's persistently <60 mL/min signify possible Chronic Kidney Disease.    Anion gap 4 (L) 5 - 15    Comment: Performed at [facility], [address] [place], [place] [address]  CBC     Status: Abnormal   Collection Time: 03/14/18  4:39 AM  Result Value Ref Range   WBC 4.4 3.6 - 11.0 K/uL   RBC 3.06 (L) 3.80 - 5.20 MIL/uL   Hemoglobin 10.6 (L) 12.0 - 16.0 g/dL   HCT 30.4 (L) 35.0 - 47.0 %   MCV 99.5 80.0 - 100.0 fL   MCH 34.7 (H) 26.0 - 34.0 pg   MCHC 34.9 32.0 - 36.0 g/dL   RDW 14.5 11.5 - 14.5 %   Platelets 56 (L) 150 - 440 K/uL    Comment: Performed at [facility], [address] [place], [place] [address]  Basic metabolic panel     Status: Abnormal   Collection Time: 03/14/18  4:39 AM  Result Value Ref Range   Sodium 132 (L) 135 - 145 mmol/L   Potassium 3.6 3.5 - 5.1 mmol/L   Chloride 108 98 - 111 mmol/L   CO2 20 (L) 22 - 32 mmol/L   Glucose, Bld 114 (H) 70 - 99 mg/dL   BUN <5 (L) 6 - 20 mg/dL   Creatinine, Ser 0.41 (L) 0.44 - 1.00 mg/dL   Calcium 7.8 (L) 8.9 - 10.3 mg/dL   GFR calc non Af Amer >60 >60 mL/min   GFR calc Af Amer >60 >60 mL/min    Comment: (NOTE) The eGFR has been calculated using the CKD EPI equation. This calculation has not been validated in all clinical situations. eGFR's persistently <60 mL/min signify possible Chronic Kidney Disease.    Anion gap 4 (L) 5 - 15    Comment: Performed at [facility], [address] [place],  [address]   No results found.  Assessment/Plan:  I was asked see this patient for possible perirectal abscess.  I do not see any signs of an active perirectal abscess that would require drainage.  She is  thrombocytopenic and jaundiced.   Bilirubin is 3.3.  I would recommend antibiotics in this patient only and reevaluation periodically she does not have clear signs of the need for I&D at this time.  Florene Glen, MD, FACS

## 2018-03-14 NOTE — Progress Notes (Signed)
MD Sheryle Hailiamond made aware that pt had a bloody stool and that pt is also c/o rectal pain. On assessment pt has a perianal hard/swelling site, requested MD to come and assess pt/site. Will continue to monitor.

## 2018-03-14 NOTE — Progress Notes (Signed)
Physical Therapy Treatment Patient Details Name: Susan [name] MRN: [id number] DOB: 25-May-1971 Today's Date: 03/14/2018    History of Present Illness 47 y/o female presented to hospital on 03/09/18 and subseqently diagnosed with nausea, vomiting, abdominal pain and diarrhea as well as hypokalemia and hyponatremia. Pt has a past medical history that includes anemeia, anxiety, cirrhosis, COPD, depression, emphysema, GERD, hep C, HTN, and hyperlipedemia    PT Comments    Pt initially very slow to motivated getting up and walking, did self don shorts and bed room slippers.  She initially needed walker with standing and very slow gait but ultimately was able to sustain prolonged last half of ambulation w/o AD, though again she was very slow, cautious and needed some cuing to stay on task and maintain appropriate directions.  Pt was able to negotiate up/down steps with b/l rails and no direct assist though again she needed more cuing than would be expected.  Overall pt should be able to safely go home, HHPT may be a good idea given her gait speed, confidence and general safety awareness, however pt seems to feel she may not need it if she continues to improve.  Follow Up Recommendations  Home health PT     Equipment Recommendations       Recommendations for Other Services       Precautions / Restrictions Precautions Precautions: None Restrictions Weight Bearing Restrictions: No    Mobility  Bed Mobility Overal bed mobility: Modified Independent             General bed mobility comments: Pt able to perform without physical assistance however requires increased UE support, time and effort to perform  Transfers Overall transfer level: Modified independent Equipment used: Rolling walker (2 wheeled)             General transfer comment: Pt able to rise and maintain balance w/o issue  Ambulation/Gait Ambulation/Gait assistance: Supervision Gait Distance (Feet): 250  Feet Assistive device: Rolling walker (2 wheeled);None       General Gait Details: slow, steady gait with no overt safety issues.  Pt's O2 remained in the high 90s t/o the effort first 75 ft with walker, then remaineder of the effort with no AD, no HHA.   Stairs Stairs: Yes Stairs assistance: Supervision Stair Management: Two rails Number of Stairs: 4 General stair comments: Pt able to negotiate the steps slowly, but confidently   Wheelchair Mobility    Modified Rankin (Stroke Patients Only)       Balance Overall balance assessment: Modified Independent                                          Cognition Arousal/Alertness: Awake/alert Behavior During Therapy: WFL for tasks assessed/performed Overall Cognitive Status: Within Functional Limits for tasks assessed                                        Exercises      General Comments        Pertinent Vitals/Pain Pain Location: unrated general back and LE pain    Home Living                      Prior Function            PT  Goals (current goals can now be found in the care plan section) Progress towards PT goals: Progressing toward goals    Frequency    Min 2X/week      PT Plan Current plan remains appropriate    Co-evaluation              AM-PAC PT "6 Clicks" Daily Activity  Outcome Measure  Difficulty turning over in bed (including adjusting bedclothes, sheets and blankets)?: None Difficulty moving from lying on back to sitting on the side of the bed? : None Difficulty sitting down on and standing up from a chair with arms (e.g., wheelchair, bedside commode, etc,.)?: A Little Help needed moving to and from a bed to chair (including a wheelchair)?: A Little Help needed walking in hospital room?: None Help needed climbing 3-5 steps with a railing? : A Little 6 Click Score: 21    End of Session Equipment Utilized During Treatment: Gait  belt Activity Tolerance: Patient tolerated treatment well Patient left: with bed alarm set;with call bell/phone within reach Nurse Communication: Mobility status PT Visit Diagnosis: Unsteadiness on feet (R26.81);Other abnormalities of gait and mobility (R26.89);Muscle weakness (generalized) (M62.81);Difficulty in walking, not elsewhere classified (R26.2)     Time: [id number] PT Time Calculation (min) (ACUTE ONLY): 27 min  Charges:  $Gait Training: 23-37 mins                     Malachi ProGalen R Floride Hutmacher, DPT 03/14/2018, 5:37 PM

## 2018-03-14 NOTE — Progress Notes (Addendum)
Susan BouillonVarnita Nabil Bubolz, MD [address], [address], [place], [place], [id number] [address], [address], [place], [place], [address] Phone: [phone]  Fax: [phone]   Subjective: Pt. Had one bloody BM last night, patient describes this to be bright red blood.  No further bleeding today.  Objective: Exam: Vital signs in last 24 hours: Vitals:   03/13/18 0746 03/13/18 1700 03/13/18 2327 03/14/18 0742  BP: (!) 102/54 (!) 96/57 100/60 105/72  Pulse: 88 91 87 76  Resp: 18 18 18  (!) 21  Temp: 98 F (36.7 C) 97.9 F (36.6 C) 98.5 F (36.9 C) 98.5 F (36.9 C)  TempSrc: Oral Oral  Oral  SpO2: 100% 98% 98% 96%  Weight:      Height:       Weight change:   Intake/Output Summary (Last 24 hours) at 03/14/2018 1341 Last data filed at 03/14/2018 0900 Gross per 24 hour  Intake 1071.67 ml  Output 1 ml  Net 1070.67 ml    General: No acute distress, AAO x3 Abd: Soft, NT/ND, No HSM Rectal: External hemorrhoid present, nonbleeding.  Nodular area of tenderness and erythema in the perianal area, not actively bleeding at this time, but skin breakdown noted on that area itself.  Digital rectal exam revealed brown stool, no masses in the rectal vault, no melena or bright red blood Skin: Warm, no rashes Neck: Supple, Trachea midline   Lab Results: Lab Results  Component Value Date   WBC 4.4 03/14/2018   HGB 10.6 (L) 03/14/2018   HCT 30.4 (L) 03/14/2018   MCV 99.5 03/14/2018   PLT 56 (L) 03/14/2018   Micro Results: Recent Results (from the past 240 hour(s))  C difficile quick scan w PCR reflex     Status: None   Collection Time: 03/10/18 12:34 AM  Result Value Ref Range Status   C Diff antigen NEGATIVE NEGATIVE Final   C Diff toxin NEGATIVE NEGATIVE Final   C Diff interpretation No C. difficile detected.  Final    Comment: Performed at [facility], [address] [place], [place] [address]  Gastrointestinal Panel by PCR , Stool     Status: Abnormal   Collection Time:  03/10/18 12:34 AM  Result Value Ref Range Status   Campylobacter species DETECTED (A) NOT DETECTED Final    Comment: RESULT CALLED TO, READ BACK BY AND VERIFIED WITH: Barnabas HarriesLAURIE ALLEN AT [id number] 03/10/18.PMH    Plesimonas shigelloides NOT DETECTED NOT DETECTED Final   Salmonella species NOT DETECTED NOT DETECTED Final   Yersinia enterocolitica NOT DETECTED NOT DETECTED Final   Vibrio species NOT DETECTED NOT DETECTED Final   Vibrio cholerae NOT DETECTED NOT DETECTED Final   Enteroaggregative E coli (EAEC) NOT DETECTED NOT DETECTED Final   Enteropathogenic E coli (EPEC) DETECTED (A) NOT DETECTED Final    Comment: RESULT CALLED TO, READ BACK BY AND VERIFIED WITHBarnabas Harries: LAURIE ALLEN AT [id number] 03/10/18.PMH    Enterotoxigenic E coli (ETEC) NOT DETECTED NOT DETECTED Final   Shiga like toxin producing E coli (STEC) NOT DETECTED NOT DETECTED Final   Shigella/Enteroinvasive E coli (EIEC) NOT DETECTED NOT DETECTED Final   Cryptosporidium NOT DETECTED NOT DETECTED Final   Cyclospora cayetanensis NOT DETECTED NOT DETECTED Final   Entamoeba histolytica NOT DETECTED NOT DETECTED Final   Giardia lamblia NOT DETECTED NOT DETECTED Final   Adenovirus F40/41 NOT DETECTED NOT DETECTED Final   Astrovirus NOT DETECTED NOT DETECTED Final   Norovirus GI/GII NOT DETECTED NOT DETECTED Final   Rotavirus A  NOT DETECTED NOT DETECTED Final   Sapovirus (I, II, IV, and V) NOT DETECTED NOT DETECTED Final    Comment: Performed at [facility], [address] [place], [place] [address]   Studies/Results: No results found. Medications:  Scheduled Meds: . clonazePAM  1 mg Oral BID  . escitalopram  20 mg Oral QHS  . folic acid  1 mg Oral Daily  . LORazepam  0-4 mg Intravenous Q12H  . mometasone-formoterol  2 puff Inhalation BID  . multivitamin with minerals  1 tablet Oral Daily  . nicotine  21 mg Transdermal Daily  . pantoprazole  40 mg Oral Daily  . potassium chloride  40 mEq Oral BID  . rifaximin  550 mg Oral BID  .  thiamine  100 mg Oral Daily   Or  . thiamine  100 mg Intravenous Daily  . traMADol  50 mg Oral Q6H   Continuous Infusions: . sodium chloride 0.9 % 1,000 mL infusion 100 mL/hr at 03/14/18 0711   PRN Meds:.alum & mag hydroxide-simeth, bisacodyl, hydrOXYzine, ipratropium-albuterol, metoCLOPramide (REGLAN) injection, oxyCODONE, senna-docusate   Assessment: Active Problems:   Diarrhea  47 year old female with diarrhea due to positive Campylobacter and E. coli on stool testing, with hematochezia last night  Plan: Rectal exam reveals nodular tender area in the perianal area, with skin breakdown on top of it, and external hemorrhoids Hemoglobin stable and vital signs stable, therefore no signs of upper GI bleed at this time  Hematochezia likely due to nodular tender area noted in the perianal area or due to her external hemorrhoids Would recommend surgery consult to evaluate this area, and if no surgical intervention planned, would recommend wound care consult  No indication for colonoscopy at this time In addition, in the setting of acute infection with Campylobacter and E. coli, colonoscopy would have higher risks than benefits Some amount of blood per rectum is also expected in the setting of acute infection, but as long as she continues to stay hemodynamically stable, hemoglobin stays stable, and bleeding resolves, no indication for colonoscopy at this time  Continue daily CBC and transfuse PRN Avoid constipation High fiber diet and miralax daily  Follow up with Dr. Marva Panda on discharge due to history of polyps and to discuss need for surveillance colonoscopy after acute infection resolves.    LOS: 3 days   Susan Bouillon, MD 03/14/2018, 1:41 PM

## 2018-03-15 LAB — BASIC METABOLIC PANEL
ANION GAP: 4 — AB (ref 5–15)
BUN: <5 mg/dL — ABNORMAL LOW (ref 6–20)
CALCIUM: 8.2 mg/dL — AB (ref 8.9–10.3)
CHLORIDE: 109 mmol/L (ref 98–111)
CO2: 22 mmol/L (ref 22–32)
Creatinine, Ser: 0.33 mg/dL — ABNORMAL LOW (ref 0.44–1.00)
GFR calc non Af Amer: >60 mL/min (ref >60)
GLUCOSE: 116 mg/dL — AB (ref 70–99)
Potassium: 3.7 mmol/L (ref 3.5–5.1)
Sodium: 135 mmol/L (ref 135–145)

## 2018-03-15 LAB — CBC
HEMATOCRIT: 30.5 % — AB (ref 35.0–47.0)
HEMOGLOBIN: 10.6 g/dL — AB (ref 12.0–16.0)
MCH: 34.4 pg — ABNORMAL HIGH (ref 26.0–34.0)
MCHC: 34.6 g/dL (ref 32.0–36.0)
MCV: 99.5 fL (ref 80.0–100.0)
Platelets: 70 10*3/uL — ABNORMAL LOW (ref 150–440)
RBC: 3.07 MIL/uL — ABNORMAL LOW (ref 3.80–5.20)
RDW: 15.2 % — ABNORMAL HIGH (ref 11.5–14.5)
WBC: 4 10*3/uL (ref 3.6–11.0)

## 2018-03-15 MED ORDER — TRAMADOL HCL 50 MG PO TABS: 50.0000 mg | ORAL_TABLET | Freq: Four times a day (QID) | ORAL | 0 refills | Status: DC

## 2018-03-15 MED ORDER — RIFAXIMIN 550 MG PO TABS: 550.0000 mg | ORAL_TABLET | Freq: Two times a day (BID) | ORAL | 0 refills | Status: AC

## 2018-03-15 NOTE — Discharge Summary (Signed)
[facility] - [place] at [facility]   PATIENT NAME: Susan [name]    MR#:  [id number]  DATE OF BIRTH:  10/26/1970  DATE OF ADMISSION:  03/09/2018   ADMITTING PHYSICIAN: Barbaraann Rondo, MD  DATE OF DISCHARGE: 03/15/18  PRIMARY CARE PHYSICIAN: Susan [name] Monday, MD   ADMISSION DIAGNOSIS:  Elevated LFTs [R94.5] Right upper quadrant abdominal pain [R10.11] Diarrhea, unspecified type [R19.7] DISCHARGE DIAGNOSIS:  Active Problems:   Diarrhea  SECONDARY DIAGNOSIS:   Past Medical History:  Diagnosis Date  . Allergy   . Anemia   . Anxiety   . Asthma   . Cirrhosis (HCC)   . COPD (chronic obstructive pulmonary disease) (HCC) 12/09/2014  . Depression   . Emphysema of lung (HCC)   . GERD (gastroesophageal reflux disease)   . Heart murmur   . Hepatitis C    pt said they told her she had this last visit here  . Hyperlipidemia   . Hypertension    HOSPITAL COURSE:   [name] is a 47 year old female with a PMH of COPD, depression, hepatitis C with cirrhosis, HTN, HLD who presented to the ED with diarrhea for the last 3-4 weeks. She was initially dehydrated and tachycardic and this improved with fluids. She was admitted for further management.  N/V/Abd pain/bloody diarrhea: due to infectious colitis - GI pathogen panel + for campylobacter and EPEC - Abdominal US with extrahepatic biliary duct dilatation - MCRP with stable dilatation of the common bile duct and no choledocholithiasis. - hemoglobin dropped from 15 > 10.6, but was stable at the time of discharge - GI consulted and recommended outpatient GI work-up - Treated with Azithromycin 500 mg PO daily, but this was stopped on discharge, as her symptoms had improved - PT recommended home health PT, but patient declined this  Perianal skin breakdown/induration- right side of perianal area with an area of induration - seen by surgery who did not feel she needed I&D - did not start antibiotics because the area  had drained and markedly improved on its own. - follow-up as an outpatient  Cirrhosis w/ transaminasemia, hyperbilirubinemia, hypoalbuminemia - stable - started on Rifaximin this admission - lasix and spironolactone continued on discharge  Thrombocytopenia- likely due to cirrhosis. 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 Doxycycline Hives  . Latex Hives  . Promethazine Diarrhea  . Ranitidine Nausea Only  . Zofran [Ondansetron Hcl] Itching   DISCHARGE MEDICATIONS:   Allergies as of 03/15/2018      Reactions   Doxycycline Hives   Latex Hives   Promethazine Diarrhea   Ranitidine Nausea Only   Zofran [ondansetron Hcl] Itching      Medication List    TAKE these medications   albuterol 108 (90 Base) MCG/ACT inhaler Commonly known as:  PROVENTIL HFA;VENTOLIN HFA Inhale 1 puff into the lungs every 4 (four) hours as needed for wheezing or shortness of breath.   aluminum-magnesium hydroxide-simethicone 200-200-20 MG/5ML Susp Commonly known as:  MAALOX Take 30 mLs 4 (four) times daily -  before meals and at bedtime by mouth.   budesonide-formoterol 80-4.5 MCG/ACT inhaler Commonly known as:  SYMBICORT Inhale 2 puffs into the lungs 2 (two) times daily.   clonazePAM 1 MG tablet Commonly  known as:  KLONOPIN Take 1 tablet (1 mg total) by mouth 2 (two) times daily.   escitalopram 20 MG tablet Commonly known as:  LEXAPRO Take 1 tablet (20 mg total) by mouth at bedtime.   furosemide 40 MG tablet Commonly known as:  LASIX Take 1 tablet (40 mg total) by mouth 2 (two) times daily.   hydrOXYzine 25 MG tablet Commonly known as:  ATARAX/VISTARIL Take 1 tablet (25 mg total) by mouth 3  (three) times daily as needed (itching).   ipratropium-albuterol 0.5-2.5 (3) MG/3ML Soln Commonly known as:  DUONEB Take 3 mLs by nebulization every 6 (six) hours as needed (for shortness of breath/wheezing).   loratadine 10 MG tablet Commonly known as:  CLARITIN Take 1 tablet (10 mg total) by mouth daily.   metoCLOPramide 10 MG tablet Commonly known as:  REGLAN Take 1 tablet (10 mg total) every 6 (six) hours as needed by mouth.   pantoprazole 40 MG tablet Commonly known as:  PROTONIX Take 1 tablet (40 mg total) by mouth daily.   Potassium Chloride ER 20 MEQ Tbcr Take 40 mEq by mouth daily.   rifaximin 550 MG Tabs tablet Commonly known as:  XIFAXAN Take 1 tablet (550 mg total) by mouth 2 (two) times daily.   spironolactone 100 MG tablet Commonly known as:  ALDACTONE Take 1 tablet (100 mg total) by mouth daily.   traMADol 50 MG tablet Commonly known as:  ULTRAM Take 1 tablet (50 mg total) by mouth every 6 (six) hours.        DISCHARGE INSTRUCTIONS:  1. F/u with PCP in 1-2 weeks 2. F/u with GI in 1-2 weeks. Advised patient to schedule appointment with GI practice that saw her inpatient. She was dismissed from her previous GI practice. 3. Seen by PT, who recommended HHPT, but patient declined this. DIET:  Soft diet DISCHARGE CONDITION:  Stable ACTIVITY:  Activity as tolerated OXYGEN:  Home Oxygen: No.  Oxygen Delivery: room air DISCHARGE LOCATION:  home   If you experience worsening of your admission symptoms, develop shortness of breath, life threatening emergency, suicidal or homicidal thoughts you must seek medical attention immediately by calling 911 or calling your MD immediately  if symptoms less severe.  You Must read complete instructions/literature along with all the possible adverse reactions/side effects for all the Medicines you take and that have been prescribed to you. Take any new Medicines after you have completely understood and accpet all the  possible adverse reactions/side effects.   Please note  You were cared for by a hospitalist during your hospital stay. If you have any questions about your discharge medications or the care you received while you were in the hospital after you are discharged, you can call the unit and asked to speak with the hospitalist on call if the hospitalist that took care of you is not available. Once you are discharged, your primary care physician will handle any further medical issues. Please note that NO REFILLS for any discharge medications will be authorized once you are discharged, as it is imperative that you return to your primary care physician (or establish a relationship with a primary care physician if you do not have one) for your aftercare needs so that they can reassess your need for medications and monitor your lab values.    On the day of Discharge:  VITAL SIGNS:  Blood pressure 108/75, pulse 72, temperature 97.8 F (36.6 C), resp. rate 18, height 5\' 3"  (1.6 m), weight 74.8 kg,  SpO2 100 %. PHYSICAL EXAMINATION:  GENERAL:  47 y.o.-year-old patient lying in the bed with no acute distress.  EYES: Pupils equal, round, reactive to light and accommodation. No scleral icterus. Extraocular muscles intact.  HEENT: Head atraumatic, normocephalic. Oropharynx and nasopharynx clear.  NECK:  Supple, no jugular venous distention. No thyroid enlargement, no tenderness.  LUNGS: Normal breath sounds bilaterally, no wheezing, rales,rhonchi or crepitation. No use of accessory muscles of respiration.  CARDIOVASCULAR: S1, S2 normal. No murmurs, rubs, or gallops.  ABDOMEN: +mild generalized tenderness to palpation, soft, non-distended. Bowel sounds present. No organomegaly or mass.  EXTREMITIES: No pedal edema, cyanosis, or clubbing.  NEUROLOGIC: Cranial nerves II through XII are intact. Global weakness. Gait not checked.  PSYCHIATRIC: The patient is alert and oriented x 3.  SKIN: No obvious rash, lesion, or  ulcer.  DATA REVIEW:   CBC Recent Labs  Lab 03/15/18 0550  WBC 4.0  HGB 10.6*  HCT 30.5*  PLT 70*    Chemistries  Recent Labs  Lab 03/09/18 2031  03/13/18 0436  03/15/18 0550  NA 128*   < > 131*   < > 135  K 2.8*   < > 3.2*   < > 3.7  CL 100   < > 108   < > 109  CO2 18*   < > 19*   < > 22  GLUCOSE 134*   < > 98   < > 116*  BUN 12   < > <5*   < > <5*  CREATININE 0.61   < > 0.39*   < > 0.33*  CALCIUM 9.5   < > 8.2*   < > 8.2*  MG 2.1  --   --   --   --   AST 93*   < > 102*  --   --   ALT 51*   < > 46*  --   --   ALKPHOS 168*   < > 133*  --   --   BILITOT 3.5*   < > 3.3*  --   --    < > = values in this interval not displayed.     Microbiology Results  Results for orders placed or performed during the hospital encounter of 03/09/18  C difficile quick scan w PCR reflex     Status: None   Collection Time: 03/10/18 12:34 AM  Result Value Ref Range Status   C Diff antigen NEGATIVE NEGATIVE Final   C Diff toxin NEGATIVE NEGATIVE Final   C Diff interpretation No C. difficile detected.  Final    Comment: Performed at [facility], [address] [place], [place] [address]  Gastrointestinal Panel by PCR , Stool     Status: Abnormal   Collection Time: 03/10/18 12:34 AM  Result Value Ref Range Status   Campylobacter species DETECTED (A) NOT DETECTED Final    Comment: RESULT CALLED TO, READ BACK BY AND VERIFIED WITH: Barnabas Harries AT 6045 03/10/18.PMH    Plesimonas shigelloides NOT DETECTED NOT DETECTED Final   Salmonella species NOT DETECTED NOT DETECTED Final   Yersinia enterocolitica NOT DETECTED NOT DETECTED Final   Vibrio species NOT DETECTED NOT DETECTED Final   Vibrio cholerae NOT DETECTED NOT DETECTED Final   Enteroaggregative E coli (EAEC) NOT DETECTED NOT DETECTED Final   Enteropathogenic E coli (EPEC) DETECTED (A) NOT DETECTED Final    Comment: RESULT CALLED TO, READ BACK BY AND VERIFIED WITHBarnabas Harries AT 4098 03/10/18.PMH  Enterotoxigenic E coli  (ETEC) NOT DETECTED NOT DETECTED Final   Shiga like toxin producing E coli (STEC) NOT DETECTED NOT DETECTED Final   Shigella/Enteroinvasive E coli (EIEC) NOT DETECTED NOT DETECTED Final   Cryptosporidium NOT DETECTED NOT DETECTED Final   Cyclospora cayetanensis NOT DETECTED NOT DETECTED Final   Entamoeba histolytica NOT DETECTED NOT DETECTED Final   Giardia lamblia NOT DETECTED NOT DETECTED Final   Adenovirus F40/41 NOT DETECTED NOT DETECTED Final   Astrovirus NOT DETECTED NOT DETECTED Final   Norovirus GI/GII NOT DETECTED NOT DETECTED Final   Rotavirus A NOT DETECTED NOT DETECTED Final   Sapovirus (I, II, IV, and V) NOT DETECTED NOT DETECTED Final    Comment: Performed at [facility], [address] [place], [place] [address]    RADIOLOGY:  No results found.   Management plans discussed with the patient, family and they are in agreement.  CODE STATUS: Full Code   TOTAL TIME TAKING CARE OF THIS PATIENT: 40 minutes.    Jinny Blossom Mayo M.D on 03/15/2018 at 8:36 AM  Between 7am to 6pm - Pager - [phone]  After 6pm go to [email or web address] - Social research officer, government  [facility]  Hospitalists  Office  [phone]  CC: Primary care physician; Susan [name] Monday, MD   Note: This dictation was prepared with Dragon dictation along with smaller phrase technology. Any transcriptional errors that result from this process are unintentional.

## 2018-03-15 NOTE — Plan of Care (Signed)
  Problem: Education: Goal: Knowledge of General Education information will improve Description Including pain rating scale, medication(s)/side effects and non-pharmacologic comfort measures Outcome: Adequate for Discharge   Problem: Health Behavior/Discharge Planning: Goal: Ability to manage health-related needs will improve Outcome: Adequate for Discharge   Problem: Clinical Measurements: Goal: Ability to maintain clinical measurements within normal limits will improve Outcome: Adequate for Discharge Goal: Will remain free from infection Outcome: Adequate for Discharge Goal: Diagnostic test results will improve Outcome: Adequate for Discharge Goal: Respiratory complications will improve Outcome: Adequate for Discharge Goal: Cardiovascular complication will be avoided Outcome: Adequate for Discharge   Problem: Elimination: Goal: Will not experience complications related to bowel motility Outcome: Adequate for Discharge Goal: Will not experience complications related to urinary retention Outcome: Adequate for Discharge   Problem: Pain Managment: Goal: General experience of comfort will improve Outcome: Adequate for Discharge   Problem: Safety: Goal: Ability to remain free from injury will improve Outcome: Adequate for Discharge

## 2018-03-15 NOTE — Progress Notes (Signed)
Patient ready for discharge home with boyfriend. Reviewed all discharge instructions including f/u appointments and medications and prescriptions.

## 2018-03-15 NOTE — Discharge Instructions (Signed)
It was a pleasure taking care of you while you were here!  You came into the hospital with abdominal pain and diarrhea. We found that you had a bacterial infection of your intestines. We gave you antibiotics and your diarrhea improved.  I have started a medication called Rifaximin to help keep your ammonia level down. Please take this twice a day.  Please make sure you schedule a follow-up appointment with your primary care doctor and the GI doctor in the next 1-2 weeks.  -Dr. Nancy MarusMayo

## 2018-03-15 NOTE — Care Management Note (Signed)
Case Management Note  Patient Details  Name: Florence CannerChristy P Bartunek MRN: [id number] Date of Birth: 12/31/1970  Subjective/Objective:  Spoke with patient to discuss PT recommendations of home health PT. Patient declining at this time. No DME needs. She is ambulating well and doesn't feel like she needs any additional assistance. Case Closed.                   Action/Plan:   Expected Discharge Date:  03/15/18               Expected Discharge Plan:  Home/Self Care  In-House Referral:     Discharge planning Services  CM Consult  Post Acute Care Choice:    Choice offered to:     DME Arranged:    DME Agency:     HH Arranged:  Patient Refused HH HH Agency:     Status of Service:  Completed, signed off  If discussed at [employer] Length of Stay Meetings, dates discussed:    Additional Comments:  Marily MemosLisa M Pami Wool, RN 03/15/2018, 11:47 AM

## 2018-03-20 ENCOUNTER — Telehealth: Payer: Self-pay

## 2018-03-20 NOTE — Telephone Encounter (Signed)
EMMI Follow-up: Ms. Susan [name] called as she had just received a call from my number.  Said she is still feeling very weak and asked if Home Health PT would be coming out.  I reviewed RN CM note and patient had refused Home Health at discharge but feels like it may be helpful.  Directed her to call her PCP for an order. She asked about some take home medications from the hospital, blood thinners and antiboitics.  I contacted Susan StanleyLisa, RN CM to see if she could call her back and address medication concerns and she said she would.  No other needs noted.

## 2018-03-20 NOTE — Telephone Encounter (Signed)
Received message that patient had questions regarding her medications. TC to patient. No answer. Left message for return call.

## 2018-03-27 ENCOUNTER — Ambulatory Visit: Payer: Medicaid Other | Admitting: Gastroenterology

## 2018-03-27 ENCOUNTER — Encounter: Payer: Self-pay | Admitting: Gastroenterology

## 2018-04-02 ENCOUNTER — Encounter: Payer: Self-pay | Admitting: Emergency Medicine

## 2018-04-02 ENCOUNTER — Inpatient Hospital Stay
Admission: EM | Admit: 2018-04-02 | Discharge: 2018-04-06 | DRG: 373 | Disposition: A | Discharge status: Home / Self Care | Payer: Medicaid Other | Attending: Internal Medicine | Admitting: Internal Medicine

## 2018-04-02 ENCOUNTER — Other Ambulatory Visit: Payer: Self-pay

## 2018-04-02 DIAGNOSIS — I4581 Long QT syndrome: Secondary | ICD-10-CM | POA: Diagnosis present

## 2018-04-02 DIAGNOSIS — Z881 Allergy status to other antibiotic agents status: Secondary | ICD-10-CM

## 2018-04-02 DIAGNOSIS — G8929 Other chronic pain: Secondary | ICD-10-CM | POA: Diagnosis present

## 2018-04-02 DIAGNOSIS — Z7951 Long term (current) use of inhaled steroids: Secondary | ICD-10-CM

## 2018-04-02 DIAGNOSIS — Z803 Family history of malignant neoplasm of breast: Secondary | ICD-10-CM | POA: Diagnosis not present

## 2018-04-02 DIAGNOSIS — Z23 Encounter for immunization: Secondary | ICD-10-CM

## 2018-04-02 DIAGNOSIS — J439 Emphysema, unspecified: Secondary | ICD-10-CM | POA: Diagnosis present

## 2018-04-02 DIAGNOSIS — E876 Hypokalemia: Secondary | ICD-10-CM | POA: Diagnosis present

## 2018-04-02 DIAGNOSIS — F329 Major depressive disorder, single episode, unspecified: Secondary | ICD-10-CM | POA: Diagnosis present

## 2018-04-02 DIAGNOSIS — F1010 Alcohol abuse, uncomplicated: Secondary | ICD-10-CM | POA: Diagnosis present

## 2018-04-02 DIAGNOSIS — D6959 Other secondary thrombocytopenia: Secondary | ICD-10-CM | POA: Diagnosis present

## 2018-04-02 DIAGNOSIS — Z888 Allergy status to other drugs, medicaments and biological substances status: Secondary | ICD-10-CM | POA: Diagnosis not present

## 2018-04-02 DIAGNOSIS — Z808 Family history of malignant neoplasm of other organs or systems: Secondary | ICD-10-CM | POA: Diagnosis not present

## 2018-04-02 DIAGNOSIS — I10 Essential (primary) hypertension: Secondary | ICD-10-CM | POA: Diagnosis present

## 2018-04-02 DIAGNOSIS — B1920 Unspecified viral hepatitis C without hepatic coma: Secondary | ICD-10-CM | POA: Diagnosis present

## 2018-04-02 DIAGNOSIS — Z79899 Other long term (current) drug therapy: Secondary | ICD-10-CM | POA: Diagnosis not present

## 2018-04-02 DIAGNOSIS — F419 Anxiety disorder, unspecified: Secondary | ICD-10-CM | POA: Diagnosis present

## 2018-04-02 DIAGNOSIS — K219 Gastro-esophageal reflux disease without esophagitis: Secondary | ICD-10-CM | POA: Diagnosis present

## 2018-04-02 DIAGNOSIS — F17210 Nicotine dependence, cigarettes, uncomplicated: Secondary | ICD-10-CM | POA: Diagnosis present

## 2018-04-02 DIAGNOSIS — K7460 Unspecified cirrhosis of liver: Secondary | ICD-10-CM | POA: Diagnosis present

## 2018-04-02 DIAGNOSIS — Z801 Family history of malignant neoplasm of trachea, bronchus and lung: Secondary | ICD-10-CM | POA: Diagnosis not present

## 2018-04-02 DIAGNOSIS — R0602 Shortness of breath: Secondary | ICD-10-CM

## 2018-04-02 DIAGNOSIS — A071 Giardiasis [lambliasis]: Secondary | ICD-10-CM | POA: Diagnosis present

## 2018-04-02 DIAGNOSIS — R188 Other ascites: Secondary | ICD-10-CM

## 2018-04-02 DIAGNOSIS — E785 Hyperlipidemia, unspecified: Secondary | ICD-10-CM | POA: Diagnosis present

## 2018-04-02 DIAGNOSIS — R197 Diarrhea, unspecified: Secondary | ICD-10-CM | POA: Diagnosis not present

## 2018-04-02 DIAGNOSIS — R1084 Generalized abdominal pain: Secondary | ICD-10-CM

## 2018-04-02 DIAGNOSIS — Z91040 Latex allergy status: Secondary | ICD-10-CM | POA: Diagnosis not present

## 2018-04-02 LAB — CBC
HCT: 36.3 % (ref 35.0–47.0)
Hemoglobin: 12.4 g/dL (ref 12.0–16.0)
MCH: 33.5 pg (ref 26.0–34.0)
MCHC: 34.1 g/dL (ref 32.0–36.0)
MCV: 98.2 fL (ref 80.0–100.0)
PLATELETS: 78 10*3/uL — AB (ref 150–440)
RBC: 3.7 MIL/uL — ABNORMAL LOW (ref 3.80–5.20)
RDW: 16.4 % — AB (ref 11.5–14.5)
WBC: 5.1 10*3/uL (ref 3.6–11.0)

## 2018-04-02 LAB — URINALYSIS, COMPLETE (UACMP) WITH MICROSCOPIC
Bacteria, UA: NONE SEEN
Bilirubin Urine: NEGATIVE
Glucose, UA: NEGATIVE mg/dL
HGB URINE DIPSTICK: NEGATIVE
Ketones, ur: NEGATIVE mg/dL
LEUKOCYTES UA: NEGATIVE
NITRITE: NEGATIVE
PH: 6 (ref 5.0–8.0)
Protein, ur: NEGATIVE mg/dL
SPECIFIC GRAVITY, URINE: 1.016 (ref 1.005–1.030)

## 2018-04-02 LAB — GASTROINTESTINAL PANEL BY PCR, STOOL (REPLACES STOOL CULTURE)
ADENOVIRUS F40/41: NOT DETECTED
Astrovirus: NOT DETECTED
CAMPYLOBACTER SPECIES: NOT DETECTED
CRYPTOSPORIDIUM: NOT DETECTED
CYCLOSPORA CAYETANENSIS: NOT DETECTED
ENTEROPATHOGENIC E COLI (EPEC): NOT DETECTED
Entamoeba histolytica: NOT DETECTED
Enteroaggregative E coli (EAEC): NOT DETECTED
Enterotoxigenic E coli (ETEC): NOT DETECTED
GIARDIA LAMBLIA: DETECTED — AB
Norovirus GI/GII: NOT DETECTED
PLESIMONAS SHIGELLOIDES: NOT DETECTED
Rotavirus A: NOT DETECTED
SALMONELLA SPECIES: NOT DETECTED
Sapovirus (I, II, IV, and V): NOT DETECTED
Shiga like toxin producing E coli (STEC): NOT DETECTED
Shigella/Enteroinvasive E coli (EIEC): NOT DETECTED
VIBRIO SPECIES: NOT DETECTED
Vibrio cholerae: NOT DETECTED
YERSINIA ENTEROCOLITICA: NOT DETECTED

## 2018-04-02 LAB — C DIFFICILE QUICK SCREEN W PCR REFLEX
C DIFFICLE (CDIFF) ANTIGEN: NEGATIVE
C Diff interpretation: NOT DETECTED
C Diff toxin: NEGATIVE

## 2018-04-02 LAB — TROPONIN I: Troponin I: <0.03 ng/mL (ref <0.03)

## 2018-04-02 LAB — COMPREHENSIVE METABOLIC PANEL
ALT: 32 U/L (ref 0–44)
AST: 125 U/L — AB (ref 15–41)
Albumin: 2.8 g/dL — ABNORMAL LOW (ref 3.5–5.0)
Alkaline Phosphatase: 170 U/L — ABNORMAL HIGH (ref 38–126)
Anion gap: 12 (ref 5–15)
BILIRUBIN TOTAL: 2.3 mg/dL — AB (ref 0.3–1.2)
CO2: 21 mmol/L — ABNORMAL LOW (ref 22–32)
CREATININE: 0.48 mg/dL (ref 0.44–1.00)
Calcium: 8.3 mg/dL — ABNORMAL LOW (ref 8.9–10.3)
Chloride: 103 mmol/L (ref 98–111)
GFR calc Af Amer: >60 mL/min (ref >60)
Glucose, Bld: 124 mg/dL — ABNORMAL HIGH (ref 70–99)
Potassium: 2.6 mmol/L — CL (ref 3.5–5.1)
Sodium: 136 mmol/L (ref 135–145)
TOTAL PROTEIN: 8.5 g/dL — AB (ref 6.5–8.1)

## 2018-04-02 LAB — LIPASE, BLOOD: Lipase: 45 U/L (ref 11–51)

## 2018-04-02 LAB — POTASSIUM: Potassium: 3 mmol/L — ABNORMAL LOW (ref 3.5–5.1)

## 2018-04-02 LAB — MAGNESIUM: MAGNESIUM: 1.6 mg/dL — AB (ref 1.7–2.4)

## 2018-04-02 MED ORDER — LORATADINE 10 MG PO TABS
10.0000 mg | ORAL_TABLET | Freq: Every day | ORAL | Status: DC
  Administered 2018-04-02 – 2018-04-06 (×5): 10 mg via ORAL
  Filled 2018-04-02 (×6): qty 1

## 2018-04-02 MED ORDER — MOMETASONE FURO-FORMOTEROL FUM 100-5 MCG/ACT IN AERO
2.0000 | INHALATION_SPRAY | Freq: Two times a day (BID) | RESPIRATORY_TRACT | Status: DC
  Administered 2018-04-02 – 2018-04-06 (×9): 2 via RESPIRATORY_TRACT
  Filled 2018-04-02: qty 8.8

## 2018-04-02 MED ORDER — VITAMIN B-1 100 MG PO TABS
100.0000 mg | ORAL_TABLET | Freq: Every day | ORAL | Status: DC
  Administered 2018-04-02 – 2018-04-06 (×4): 100 mg via ORAL
  Filled 2018-04-02 (×4): qty 1

## 2018-04-02 MED ORDER — LORAZEPAM 2 MG/ML IJ SOLN
1.0000 mg | Freq: Four times a day (QID) | INTRAMUSCULAR | Status: DC | PRN
  Administered 2018-04-04 – 2018-04-05 (×3): 1 mg via INTRAVENOUS
  Filled 2018-04-02 (×3): qty 1

## 2018-04-02 MED ORDER — ACETAMINOPHEN 325 MG PO TABS: 650.0000 mg | ORAL_TABLET | Freq: Four times a day (QID) | ORAL | Status: DC | PRN

## 2018-04-02 MED ORDER — SPIRONOLACTONE 25 MG PO TABS
100.0000 mg | ORAL_TABLET | Freq: Every day | ORAL | Status: DC
  Administered 2018-04-02 – 2018-04-06 (×5): 100 mg via ORAL
  Filled 2018-04-02 (×5): qty 4

## 2018-04-02 MED ORDER — LORAZEPAM 2 MG PO TABS
0.0000 mg | ORAL_TABLET | Freq: Four times a day (QID) | ORAL | Status: DC
  Administered 2018-04-02 – 2018-04-03 (×2): 2 mg via ORAL
  Administered 2018-04-03: 1 mg via ORAL
  Filled 2018-04-02 (×5): qty 1

## 2018-04-02 MED ORDER — ALUM & MAG HYDROXIDE-SIMETH 200-200-20 MG/5ML PO SUSP: 30.0000 mL | Freq: Every evening | ORAL | Status: DC | PRN

## 2018-04-02 MED ORDER — LORAZEPAM 1 MG PO TABS
1.0000 mg | ORAL_TABLET | Freq: Four times a day (QID) | ORAL | Status: DC | PRN
  Administered 2018-04-03 – 2018-04-05 (×4): 1 mg via ORAL
  Filled 2018-04-02 (×5): qty 1

## 2018-04-02 MED ORDER — POTASSIUM CHLORIDE IN NACL 20-0.9 MEQ/L-% IV SOLN
INTRAVENOUS | Status: DC
  Administered 2018-04-02 – 2018-04-04 (×3): via INTRAVENOUS
  Filled 2018-04-02 (×5): qty 1000

## 2018-04-02 MED ORDER — PROMETHAZINE HCL 25 MG/ML IJ SOLN: 25.0000 mg | Freq: Four times a day (QID) | INTRAMUSCULAR | Status: DC | PRN

## 2018-04-02 MED ORDER — SENNOSIDES-DOCUSATE SODIUM 8.6-50 MG PO TABS: 1.0000 | ORAL_TABLET | Freq: Every evening | ORAL | Status: DC | PRN

## 2018-04-02 MED ORDER — THIAMINE HCL 100 MG/ML IJ SOLN
100.0000 mg | Freq: Every day | INTRAMUSCULAR | Status: DC
  Filled 2018-04-02: qty 2

## 2018-04-02 MED ORDER — POTASSIUM CHLORIDE 10 MEQ/100ML IV SOLN
10.0000 meq | INTRAVENOUS | Status: AC
  Administered 2018-04-03 (×2): 10 meq via INTRAVENOUS
  Filled 2018-04-02: qty 100

## 2018-04-02 MED ORDER — CLONAZEPAM 1 MG PO TABS
1.0000 mg | ORAL_TABLET | Freq: Two times a day (BID) | ORAL | Status: DC
  Administered 2018-04-02 – 2018-04-06 (×8): 1 mg via ORAL
  Filled 2018-04-02 (×8): qty 1

## 2018-04-02 MED ORDER — POTASSIUM CHLORIDE 10 MEQ/100ML IV SOLN
10.0000 meq | Freq: Once | INTRAVENOUS | Status: AC
  Administered 2018-04-02: 10 meq via INTRAVENOUS
  Filled 2018-04-02: qty 100

## 2018-04-02 MED ORDER — ALBUTEROL SULFATE (2.5 MG/3ML) 0.083% IN NEBU: 2.5000 mg | INHALATION_SOLUTION | RESPIRATORY_TRACT | Status: DC | PRN

## 2018-04-02 MED ORDER — FOLIC ACID 1 MG PO TABS
1.0000 mg | ORAL_TABLET | Freq: Every day | ORAL | Status: DC
  Administered 2018-04-02 – 2018-04-06 (×5): 1 mg via ORAL
  Filled 2018-04-02 (×5): qty 1

## 2018-04-02 MED ORDER — ACETAMINOPHEN 650 MG RE SUPP: 650.0000 mg | Freq: Four times a day (QID) | RECTAL | Status: DC | PRN

## 2018-04-02 MED ORDER — ENOXAPARIN SODIUM 40 MG/0.4ML ~~LOC~~ SOLN: 40.0000 mg | SUBCUTANEOUS | Status: DC

## 2018-04-02 MED ORDER — NICOTINE 14 MG/24HR TD PT24
14.0000 mg | MEDICATED_PATCH | Freq: Once | TRANSDERMAL | Status: AC
  Administered 2018-04-02: 14 mg via TRANSDERMAL
  Filled 2018-04-02: qty 1

## 2018-04-02 MED ORDER — OXYCODONE-ACETAMINOPHEN 5-325 MG PO TABS
1.0000 | ORAL_TABLET | Freq: Four times a day (QID) | ORAL | Status: DC | PRN
  Administered 2018-04-02 – 2018-04-04 (×5): 1 via ORAL
  Filled 2018-04-02 (×5): qty 1

## 2018-04-02 MED ORDER — MORPHINE SULFATE (PF) 4 MG/ML IV SOLN
4.0000 mg | Freq: Once | INTRAVENOUS | Status: AC
  Administered 2018-04-02: 4 mg via INTRAVENOUS
  Filled 2018-04-02: qty 1

## 2018-04-02 MED ORDER — POTASSIUM CHLORIDE 10 MEQ/100ML IV SOLN
10.0000 meq | INTRAVENOUS | Status: DC
  Filled 2018-04-02: qty 100

## 2018-04-02 MED ORDER — PANTOPRAZOLE SODIUM 40 MG PO TBEC
40.0000 mg | DELAYED_RELEASE_TABLET | Freq: Every day | ORAL | Status: DC
  Administered 2018-04-02 – 2018-04-06 (×5): 40 mg via ORAL
  Filled 2018-04-02 (×5): qty 1

## 2018-04-02 MED ORDER — SODIUM CHLORIDE 0.9 % IV SOLN
1000.0000 mL | Freq: Once | INTRAVENOUS | Status: AC
  Administered 2018-04-02: 1000 mL via INTRAVENOUS

## 2018-04-02 MED ORDER — HYDROXYZINE HCL 25 MG PO TABS
25.0000 mg | ORAL_TABLET | Freq: Three times a day (TID) | ORAL | Status: DC | PRN
  Administered 2018-04-02 – 2018-04-05 (×5): 25 mg via ORAL
  Filled 2018-04-02 (×5): qty 1

## 2018-04-02 MED ORDER — IPRATROPIUM-ALBUTEROL 0.5-2.5 (3) MG/3ML IN SOLN: 3.0000 mL | Freq: Four times a day (QID) | RESPIRATORY_TRACT | Status: DC | PRN

## 2018-04-02 MED ORDER — ESCITALOPRAM OXALATE 10 MG PO TABS
20.0000 mg | ORAL_TABLET | Freq: Every day | ORAL | Status: DC
  Administered 2018-04-02 – 2018-04-05 (×4): 20 mg via ORAL
  Filled 2018-04-02 (×5): qty 2

## 2018-04-02 MED ORDER — PNEUMOCOCCAL VAC POLYVALENT 25 MCG/0.5ML IJ INJ
0.5000 mL | INJECTION | INTRAMUSCULAR | Status: AC
  Administered 2018-04-06: 0.5 mL via INTRAMUSCULAR
  Filled 2018-04-02: qty 0.5

## 2018-04-02 MED ORDER — POTASSIUM CHLORIDE 10 MEQ/100ML IV SOLN
10.0000 meq | INTRAVENOUS | Status: AC
  Administered 2018-04-02 (×4): 10 meq via INTRAVENOUS
  Filled 2018-04-02 (×3): qty 100

## 2018-04-02 MED ORDER — ADULT MULTIVITAMIN W/MINERALS CH
1.0000 | ORAL_TABLET | Freq: Every day | ORAL | Status: DC
  Administered 2018-04-02 – 2018-04-06 (×5): 1 via ORAL
  Filled 2018-04-02 (×5): qty 1

## 2018-04-02 MED ORDER — LORAZEPAM 2 MG PO TABS
0.0000 mg | ORAL_TABLET | Freq: Two times a day (BID) | ORAL | Status: DC
  Administered 2018-04-05: 1 mg via ORAL
  Filled 2018-04-02: qty 1

## 2018-04-02 MED ORDER — ALUM & MAG HYDROXIDE-SIMETH 200-200-20 MG/5ML PO SUSP: 30.0000 mL | Freq: Three times a day (TID) | ORAL | Status: DC | PRN

## 2018-04-02 NOTE — ED Notes (Signed)
First Nurse Note: Patient complaining of weakness, abdominal pain, and chest pain.  States she was seen here for same Aug. 4.  Alert and oriented, NAD, placed in WC.

## 2018-04-02 NOTE — Progress Notes (Signed)
Advanced Directive (AD) Education. AD Education offered. Susan [name], [id number], has severe illness with two children in the home (5th and 12th grade). Children are with members of church. May benefit from continued AD Education and notary as she does not have transportation. She is beginning to be aware of decline (3rd recent hospitalization). She has a pastor who may be helpful in completing AD with her. History of family grief (mother, father and aunt have died in past 6 years) and has a history of ADHD. Prayer and encouragement offered.

## 2018-04-02 NOTE — ED Notes (Signed)
Patient to Room 3, Critical Potassium 2.6 called from lab, results given to Susan Ouina Carr RN who is aware of placement in room and potassium results.

## 2018-04-02 NOTE — ED Triage Notes (Signed)
Pt reports CP, abdominal pain, diarrhea, and headache for Awhile now. Pt reports has been seen for it a couple of times but it keeps coming back. Pt reports pain is to right chest and goes around to her back and then down to her right leg. Pt reports abd cramping to RLQ.

## 2018-04-02 NOTE — ED Notes (Signed)
EKG done and reviewed by Dr. Cyril LoosenKinner.

## 2018-04-02 NOTE — H&P (Signed)
[facility] - [place] at [facility]   PATIENT NAME: Susan [name]    MR#:  [id number]  DATE OF BIRTH:  02/26/1971  DATE OF ADMISSION:  04/02/2018  PRIMARY CARE PHYSICIAN: [name], MD   REQUESTING/REFERRING PHYSICIAN: Dr. Cyril Loosen  CHIEF COMPLAINT:   Diarrhea and weakness HISTORY OF PRESENT ILLNESS:  Susan [name]  is a 47 y.o. female with a known history of hepatitis C with chronic thrombocytopenia, liver cirrhosis, COPD who was hospitalized a few weeks ago due to profuse diarrhea and was seen by GI. Diarrhea was due to infectious colitis for campylobacter and EPEC. He was discharged home with decrease in her diarrhea however comes to the ER due to generalized weakness, nausea, vomiting and profuse diarrhea.  Patient denies melena.  Patient is not taking lactulose.  She was treated with azithromycin for the infectious colitis.  She has chronic right upper quadrant abdominal pain due to liver cirrhosis.  PAST MEDICAL HISTORY:   Past Medical History:  Diagnosis Date  . Allergy   . Anemia   . Anxiety   . Asthma   . Cirrhosis (HCC)   . COPD (chronic obstructive pulmonary disease) (HCC) 12/09/2014  . Depression   . Emphysema of lung (HCC)   . GERD (gastroesophageal reflux disease)   . Heart murmur   . Hepatitis C    pt said they told her she had this last visit here  . Hyperlipidemia   . Hypertension     PAST SURGICAL HISTORY:   Past Surgical History:  Procedure Laterality Date  . BREAST CYST ASPIRATION Right 12/2017   abscess aspiration  . CESAREAN SECTION  2009    SOCIAL HISTORY:   Social History   Tobacco Use  . Smoking status: Current Every Day Smoker    Packs/day: 1.00    Years: 20.00    Pack years: 20.00    Types: Cigarettes  . Smokeless tobacco: Never Used  Substance Use Topics  . Alcohol use: No    Comment: Quit drinking few years ago.    FAMILY HISTORY:   Family History  Problem Relation Age of Onset  . Other Mother   .  Lung cancer Mother   . Skin cancer Father   . Breast cancer Maternal Grandmother 60    DRUG ALLERGIES:   Allergies  Allergen Reactions  . Doxycycline Hives  . Latex Hives  . Promethazine Diarrhea  . Ranitidine Nausea Only  . Zofran [Ondansetron Hcl] Itching    REVIEW OF SYSTEMS:   Review of Systems  Constitutional: Positive for malaise/fatigue. Negative for chills and fever.  HENT: Negative.  Negative for ear discharge, ear pain, hearing loss, nosebleeds and sore throat.   Eyes: Negative.  Negative for blurred vision and pain.  Respiratory: Negative.  Negative for cough, hemoptysis, shortness of breath and wheezing.   Cardiovascular: Negative.  Negative for chest pain, palpitations and leg swelling.  Gastrointestinal: Positive for abdominal pain, diarrhea, nausea and vomiting. Negative for blood in stool.  Genitourinary: Negative.  Negative for dysuria.  Musculoskeletal: Negative.  Negative for back pain.  Skin: Negative.   Neurological: Negative for dizziness, tremors, speech change, focal weakness, seizures and headaches.  Endo/Heme/Allergies: Negative.  Does not bruise/bleed easily.  Psychiatric/Behavioral: Negative.  Negative for depression, hallucinations and suicidal ideas.    MEDICATIONS AT HOME:   Prior to Admission medications   Medication Sig Start Date End Date Taking? Authorizing Provider  albuterol (PROVENTIL HFA;VENTOLIN HFA) 108 (90 Base) MCG/ACT inhaler Inhale 1  puff into the lungs every 4 (four) hours as needed for wheezing or shortness of breath. 05/04/17  Yes Auburn BilberryPatel, Shreyang, MD  aluminum-magnesium hydroxide-simethicone (MAALOX) 200-200-20 MG/5ML SUSP Take 30 mLs 4 (four) times daily -  before meals and at bedtime by mouth. 06/15/17  Yes Sharman CheekStafford, Phillip, MD  budesonide-formoterol [facility](SYMBICORT) 80-4.5 MCG/ACT inhaler Inhale 2 puffs into the lungs 2 (two) times daily. 05/04/17  Yes Auburn BilberryPatel, Shreyang, MD  clonazePAM (KLONOPIN) 1 MG tablet Take 1 tablet (1 mg total)  by mouth 2 (two) times daily. 05/04/17  Yes Auburn BilberryPatel, Shreyang, MD  escitalopram (LEXAPRO) 20 MG tablet Take 1 tablet (20 mg total) by mouth at bedtime. 05/04/17  Yes Auburn BilberryPatel, Shreyang, MD  furosemide (LASIX) 40 MG tablet Take 1 tablet (40 mg total) by mouth 2 (two) times daily. 05/04/17  Yes Auburn BilberryPatel, Shreyang, MD  hydrOXYzine (ATARAX/VISTARIL) 25 MG tablet Take 1 tablet (25 mg total) by mouth 3 (three) times daily as needed (itching). 04/07/15  Yes Sudini, Wardell HeathSrikar, MD  ipratropium-albuterol (DUONEB) 0.5-2.5 (3) MG/3ML SOLN Take 3 mLs by nebulization every 6 (six) hours as needed (for shortness of breath/wheezing).    Yes [provider]  loratadine (CLARITIN) 10 MG tablet Take 1 tablet (10 mg total) by mouth daily. 05/04/17  Yes Auburn BilberryPatel, Shreyang, MD  pantoprazole (PROTONIX) 40 MG tablet Take 1 tablet (40 mg total) by mouth daily. 10/29/17 04/02/18 Yes Darci CurrentBrown, [place] N, MD  Potassium Chloride ER 20 MEQ TBCR Take 40 mEq by mouth daily. 05/04/17  Yes Auburn BilberryPatel, Shreyang, MD  spironolactone (ALDACTONE) 100 MG tablet Take 1 tablet (100 mg total) by mouth daily. 05/04/17  Yes Auburn BilberryPatel, Shreyang, MD  metoCLOPramide (REGLAN) 10 MG tablet Take 1 tablet (10 mg total) every 6 (six) hours as needed by mouth. Patient not taking: Reported on 04/02/2018 06/15/17   Sharman CheekStafford, Phillip, MD  rifaximin (XIFAXAN) 550 MG TABS tablet Take 1 tablet (550 mg total) by mouth 2 (two) times daily. Patient not taking: Reported on 04/02/2018 03/15/18   Mayo, Allyn KennerKaty Dodd, MD  traMADol (ULTRAM) 50 MG tablet Take 1 tablet (50 mg total) by mouth every 6 (six) hours. Patient not taking: Reported on 04/02/2018 03/15/18   MayoAllyn Kenner, Katy Dodd, MD      VITAL SIGNS:  Blood pressure 120/77, pulse 95, temperature 98.5 F (36.9 C), temperature source Oral, resp. rate 12, height 5\' 3"  (1.6 m), weight 74.8 kg, SpO2 93 %.  PHYSICAL EXAMINATION:   Physical Exam  Constitutional: She is oriented to person, place, and time. No distress.  HENT:  Head: Normocephalic.   Eyes: No scleral icterus.  Neck: Normal range of motion. Neck supple. No JVD present. No tracheal deviation present.  Cardiovascular: Normal rate and regular rhythm. Exam reveals no gallop and no friction rub.  Murmur heard.  Systolic murmur is present with a grade of 3/6. Pulmonary/Chest: Effort normal and breath sounds normal. No respiratory distress. She has no wheezes. She has no rales. She exhibits no tenderness.  Abdominal: Soft. Bowel sounds are normal. She exhibits no distension and no mass. There is tenderness. There is no rebound and no guarding.  Musculoskeletal: Normal range of motion. She exhibits no edema.       Right lower leg: She exhibits no edema.  Neurological: She is alert and oriented to person, place, and time.  Tremulous  Skin: Skin is warm. No rash noted. No erythema.  Psychiatric: Judgment normal. Her mood appears anxious.      LABORATORY PANEL:   CBC Recent Labs  Lab 04/02/18 1005  WBC 5.1  HGB 12.4  HCT 36.3  PLT 78*   ------------------------------------------------------------------------------------------------------------------  Chemistries  Recent Labs  Lab 04/02/18 1005  NA 136  K 2.6*  CL 103  CO2 21*  GLUCOSE 124*  BUN <5*  CREATININE 0.48  CALCIUM 8.3*  AST 125*  ALT 32  ALKPHOS 170*  BILITOT 2.3*   ------------------------------------------------------------------------------------------------------------------  Cardiac Enzymes Recent Labs  Lab 04/02/18 1005  TROPONINI <0.03   ------------------------------------------------------------------------------------------------------------------  RADIOLOGY:  No results found.  EKG:   Normal sinus rhythm no ST elevation or depression  IMPRESSION AND PLAN:   47 year old female with history of COPD, hepatitis C with liver cirrhosis and essential hypertension who presents to the emergency room due to generalized weakness, diarrhea, nausea and vomiting.  Patient was  hospitalized a few weeks ago for similar complaints and at that time was noted to have infectious colitis and treated with azithromycin.  1.  Recurring diarrhea: Follow-up on stool studies including C. Difficile IV fluids Reconsult GI for their expert opinion.  2.  Hypokalemia: Check magnesium Consult pharmacy to assist with replacement. Telemetry monitor 3.  EtOH abuse: CIWA protocol  4.Tobacco dependence: Patient is encouraged to quit smoking. Counseling was provided for 4 minutes.  5.  Hepatitis C/liver cirrhosis with chronic thrombocytopenia and chronic right upper quadrant abdominal pain Continue to monitor She needs Aldactone however I will hold Lasix due to hypokalemia Patient was started on rifaximin during last hospital stay may need to be continued after seen by GI  All the records are reviewed and case discussed with ED provider. Management plans discussed with the patient and she is in agreement  CODE STATUS: Full  TOTAL TIME TAKING CARE OF THIS PATIENT: 42 minutes.    Kayloni Rocco M.D on 04/02/2018 at 2:05 PM  Between 7am to 6pm - Pager - [phone]  After 6pm go to [email or web address] - password [employer]  [facility]  Office  [phone]  CC: Primary care physician; [name], MD

## 2018-04-02 NOTE — ED Provider Notes (Signed)
[facility] Emergency Department Provider Note   ____________________________________________    I have reviewed the triage vital signs and the nursing notes.   HISTORY  Chief Complaint Chest Pain; Abdominal Pain; Diarrhea; and Headache     HPI Susan [name] is a 47 y.o. female with a history of cirrhosis and COPD who presents with complaints of abdominal pain, diarrhea, fatigue and headaches.  Patient reports she has primarily right upper quadrant pain which is sharp in nature and occasionally radiates into her chest.  She had extensive work-up for this in the hospital last month with no clear diagnosis.  Since then she is also had significant diarrhea with little improvement.  She feels weak and dehydrated and fatigued.  Nothing seems to help her pain or diarrhea.   Past Medical History:  Diagnosis Date  . Allergy   . Anemia   . Anxiety   . Asthma   . Cirrhosis (HCC)   . COPD (chronic obstructive pulmonary disease) (HCC) 12/09/2014  . Depression   . Emphysema of lung (HCC)   . GERD (gastroesophageal reflux disease)   . Heart murmur   . Hepatitis C    pt said they told her she had this last visit here  . Hyperlipidemia   . Hypertension     Patient Active Problem List   Diagnosis Date Noted  . ASCUS of cervix with negative high risk HPV 12/14/2017  . Biliary obstruction   . Delirium tremens (HCC)   . Alcohol withdrawal syndrome, with delirium (HCC)   . Acute abdominal pain 04/28/2017  . Elevated LFTs 04/02/2015  . Hepatic cirrhosis (HCC) 12/12/2014  . Diarrhea 12/09/2014  . Blood in stool 12/09/2014  . Abdominal pain 12/09/2014  . COPD (chronic obstructive pulmonary disease) (HCC) 12/09/2014    Past Surgical History:  Procedure Laterality Date  . BREAST CYST ASPIRATION Right 12/2017   abscess aspiration  . CESAREAN SECTION  2009    Prior to Admission medications   Medication Sig Start Date End Date Taking? Authorizing Provider   albuterol (PROVENTIL HFA;VENTOLIN HFA) 108 (90 Base) MCG/ACT inhaler Inhale 1 puff into the lungs every 4 (four) hours as needed for wheezing or shortness of breath. 05/04/17  Yes Auburn Bilberry, MD  aluminum-magnesium hydroxide-simethicone (MAALOX) 200-200-20 MG/5ML SUSP Take 30 mLs 4 (four) times daily -  before meals and at bedtime by mouth. 06/15/17  Yes Sharman Cheek, MD  budesonide-formoterol [facility]) 80-4.5 MCG/ACT inhaler Inhale 2 puffs into the lungs 2 (two) times daily. 05/04/17  Yes Auburn Bilberry, MD  clonazePAM (KLONOPIN) 1 MG tablet Take 1 tablet (1 mg total) by mouth 2 (two) times daily. 05/04/17  Yes Auburn Bilberry, MD  escitalopram (LEXAPRO) 20 MG tablet Take 1 tablet (20 mg total) by mouth at bedtime. 05/04/17  Yes Auburn Bilberry, MD  furosemide (LASIX) 40 MG tablet Take 1 tablet (40 mg total) by mouth 2 (two) times daily. 05/04/17  Yes Auburn Bilberry, MD  hydrOXYzine (ATARAX/VISTARIL) 25 MG tablet Take 1 tablet (25 mg total) by mouth 3 (three) times daily as needed (itching). 04/07/15  Yes Sudini, Wardell Heath, MD  ipratropium-albuterol (DUONEB) 0.5-2.5 (3) MG/3ML SOLN Take 3 mLs by nebulization every 6 (six) hours as needed (for shortness of breath/wheezing).    Yes [provider]  loratadine (CLARITIN) 10 MG tablet Take 1 tablet (10 mg total) by mouth daily. 05/04/17  Yes Auburn Bilberry, MD  pantoprazole (PROTONIX) 40 MG tablet Take 1 tablet (40 mg total) by mouth  daily. 10/29/17 04/02/18 Yes Darci Current, MD  Potassium Chloride ER 20 MEQ TBCR Take 40 mEq by mouth daily. 05/04/17  Yes Auburn Bilberry, MD  spironolactone (ALDACTONE) 100 MG tablet Take 1 tablet (100 mg total) by mouth daily. 05/04/17  Yes Auburn Bilberry, MD  metoCLOPramide (REGLAN) 10 MG tablet Take 1 tablet (10 mg total) every 6 (six) hours as needed by mouth. Patient not taking: Reported on 04/02/2018 06/15/17   Sharman Cheek, MD  rifaximin (XIFAXAN) 550 MG TABS tablet Take 1 tablet (550 mg total) by  mouth 2 (two) times daily. Patient not taking: Reported on 04/02/2018 03/15/18   Mayo, Allyn Kenner, MD  traMADol (ULTRAM) 50 MG tablet Take 1 tablet (50 mg total) by mouth every 6 (six) hours. Patient not taking: Reported on 04/02/2018 03/15/18   Mayo, Allyn Kenner, MD     Allergies Doxycycline; Latex; Promethazine; Ranitidine; and Zofran [ondansetron hcl]  Family History  Problem Relation Age of Onset  . Other Mother   . Lung cancer Mother   . Skin cancer Father   . Breast cancer Maternal Grandmother 80    Social History Social History   Tobacco Use  . Smoking status: Current Every Day Smoker    Packs/day: 1.00    Years: 20.00    Pack years: 20.00    Types: Cigarettes  . Smokeless tobacco: Never Used  Substance Use Topics  . Alcohol use: No    Comment: Quit drinking few years ago.  . Drug use: No    Review of Systems  Constitutional: Fatigue Eyes: No visual changes.  ENT: No sore throat. Cardiovascular: Denies chest pain. Respiratory: Denies shortness of breath. Gastrointestinal: As above Genitourinary: Negative for dysuria. Musculoskeletal: Negative for back pain. Skin: Negative for rash. Neurological: Intermittent headaches   ____________________________________________   PHYSICAL EXAM:  VITAL SIGNS: ED Triage Vitals  Enc Vitals Group     BP 04/02/18 1003 111/73     Pulse Rate 04/02/18 1003 96     Resp 04/02/18 1003 20     Temp 04/02/18 1003 98.5 F (36.9 C)     Temp Source 04/02/18 1003 Oral     SpO2 04/02/18 1003 99 %     Weight 04/02/18 1004 74.8 kg (165 lb)     Height 04/02/18 1004 1.6 m (5\' 3" )     Head Circumference --      Peak Flow --      Pain Score 04/02/18 1003 10     Pain Loc --      Pain Edu? --      Excl. in GC? --     Constitutional: Alert and oriented.  Uncomfortable and ill-appearing Eyes: Conjunctivae are normal.   Nose: No congestion/rhinnorhea. Mouth/Throat: Mucous membranes are dry  Cardiovascular: Normal rate, regular rhythm.  Grossly normal heart sounds.  Good peripheral circulation. Respiratory: Normal respiratory effort.  No retractions.  Gastrointestinal: Minimal tenderness palpation right upper quadrant. No distention.  No CVA tenderness.  Musculoskeletal:   Warm and well perfused Neurologic:  Normal speech and language. No gross focal neurologic deficits are appreciated.  Skin:  Skin is warm, dry and intact. No rash noted. Psychiatric: Mood and affect are normal. Speech and behavior are normal.  ____________________________________________   LABS (all labs ordered are listed, but only abnormal results are displayed)  Labs Reviewed  COMPREHENSIVE METABOLIC PANEL - Abnormal; Notable for the following components:      Result Value   Potassium 2.6 (*)    CO2 21 (*)  Glucose, Bld 124 (*)    BUN <5 (*)    Calcium 8.3 (*)    Total Protein 8.5 (*)    Albumin 2.8 (*)    AST 125 (*)    Alkaline Phosphatase 170 (*)    Total Bilirubin 2.3 (*)    All other components within normal limits  CBC - Abnormal; Notable for the following components:   RBC 3.70 (*)    RDW 16.4 (*)    Platelets 78 (*)    All other components within normal limits  URINALYSIS, COMPLETE (UACMP) WITH MICROSCOPIC - Abnormal; Notable for the following components:   Color, Urine AMBER (*)    APPearance CLEAR (*)    All other components within normal limits  GASTROINTESTINAL PANEL BY PCR, STOOL (REPLACES STOOL CULTURE)  C DIFFICILE QUICK SCREEN W PCR REFLEX  LIPASE, BLOOD  TROPONIN I   ____________________________________________  EKG  ED ECG REPORT I, Jene Everyobert Valari Taylor, the attending physician, personally viewed and interpreted this ECG.  Date: 04/02/2018  Rhythm: normal sinus rhythm QRS Axis: normal Intervals: Prolonged QT interval may be related to hypokalemia ST/T Wave abnormalities: normal Narrative Interpretation: no evidence of acute ischemia  ____________________________________________  RADIOLOGY Reviewed  imaging from last hospitalization  ____________________________________________   PROCEDURES  Procedure(s) performed: No  Procedures   Critical Care performed: No ____________________________________________   INITIAL IMPRESSION / ASSESSMENT AND PLAN / ED COURSE  Pertinent labs & imaging results that were available during my care of the patient were reviewed by me and considered in my medical decision making (see chart for details).  Patient presents with primary complaint of abdominal pain for which she has had extensive work-up of unclear origin.  Significant diarrhea which has been ongoing for many weeks.  Differential includes C. difficile, infectious diarrhea.  Will resend PCR.  Will replete potassium given significant hypokalemia which is new.  IV fluids, IV morphine and reevaluate  ----------------------------------------- 1:53 PM on 04/02/2018 -----------------------------------------  Patient continues to feel weak and having intermittent pain.  Will discuss with hospitalist for admission    ____________________________________________   FINAL CLINICAL IMPRESSION(S) / ED DIAGNOSES  Final diagnoses:  Diarrhea, unspecified type  Acute hypokalemia  Generalized abdominal pain        Note:  This document was prepared using Dragon voice recognition software and may include unintentional dictation errors.    Jene EveryKinner, Dimitria Ketchum, MD 04/02/18 [phone]

## 2018-04-02 NOTE — Progress Notes (Signed)
MEDICATION RELATED CONSULT NOTE - INITIAL   Pharmacy Consult for Electrolyte Replacement/Monitoring Indication: Hypokalemia  Allergies  Allergen Reactions  . Doxycycline Hives  . Latex Hives  . Promethazine Diarrhea  . Ranitidine Nausea Only  . Zofran [Ondansetron Hcl] Itching    Patient Measurements: Height: 5\' 3"  (160 cm) Weight: 156 lb 8.4 oz (71 kg) IBW/kg (Calculated) : 52.4 Adjusted Body Weight:    Labs: BMP Latest Ref Rng & Units 04/02/2018 04/02/2018 03/15/2018  Glucose 70 - 99 mg/dL - 409(W124(H) 119(J116(H)  BUN 6 - 20 mg/dL - <4(N<5(L) <8(G<5(L)  Creatinine 0.44 - 1.00 mg/dL - 9.560.48 2.13(Y0.33(L)  Sodium 135 - 145 mmol/L - 136 135  Potassium 3.5 - 5.1 mmol/L 3.0(L) 2.6(LL) 3.7  Chloride 98 - 111 mmol/L - 103 109  CO2 22 - 32 mmol/L - 21(L) 22  Calcium 8.9 - 10.3 mg/dL - 8.3(L) 8.2(L)    Estimated Creatinine Clearance: 84.7 mL/min (by C-G formula based on SCr of 0.48 mg/dL).  Medical History: Past Medical History:  Diagnosis Date  . Allergy   . Anemia   . Anxiety   . Asthma   . Cirrhosis (HCC)   . COPD (chronic obstructive pulmonary disease) (HCC) 12/09/2014  . Depression   . Emphysema of lung (HCC)   . GERD (gastroesophageal reflux disease)   . Heart murmur   . Hepatitis C    pt said they told her she had this last visit here  . Hyperlipidemia   . Hypertension    Assessment: Patient is a 47yo female admitted for generalized weakness, N/V and profuse diarrhea. Patient was recently hospitalized for same and was treated for infectious colitis for campylobacter and EPEC with Azithromycin. Pharmacy consulted for electrolyte management due to hypokalemia.  On admission K of 2.6, received KCl 10mEq IV x one in the ED. Magnesium level is pending.  Plan:  Will order KCl 10mEq IV x 4. Will check a K level one hour after completion of last dose. Will follow up on Magnesium level.  8/27:   K @ 21:00 = 3.0  Will order KCl 10 mEq IV X 4 and recheck K on 8/28 with AM labs.   Susan [name]  D 04/02/2018 9:31 PM

## 2018-04-02 NOTE — Progress Notes (Signed)
MEDICATION RELATED CONSULT NOTE - INITIAL   Pharmacy Consult for Electrolyte Replacement/Monitoring Indication: Hypokalemia  Allergies  Allergen Reactions  . Doxycycline Hives  . Latex Hives  . Promethazine Diarrhea  . Ranitidine Nausea Only  . Zofran [Ondansetron Hcl] Itching    Patient Measurements: Height: 5\' 3"  (160 cm) Weight: 165 lb (74.8 kg) IBW/kg (Calculated) : 52.4 Adjusted Body Weight:    Labs: BMP Latest Ref Rng & Units 04/02/2018 03/15/2018 03/14/2018  Glucose 70 - 99 mg/dL 409(W124(H) 119(J116(H) 478(G114(H)  BUN 6 - 20 mg/dL <9(F<5(L) <6(O<5(L) <1(H<5(L)  Creatinine 0.44 - 1.00 mg/dL 0.860.48 5.78(I0.33(L) 6.96(E0.41(L)  Sodium 135 - 145 mmol/L 136 135 132(L)  Potassium 3.5 - 5.1 mmol/L 2.6(LL) 3.7 3.6  Chloride 98 - 111 mmol/L 103 109 108  CO2 22 - 32 mmol/L 21(L) 22 20(L)  Calcium 8.9 - 10.3 mg/dL 8.3(L) 8.2(L) 7.8(L)    Estimated Creatinine Clearance: 87 mL/min (by C-G formula based on SCr of 0.48 mg/dL).  Medical History: Past Medical History:  Diagnosis Date  . Allergy   . Anemia   . Anxiety   . Asthma   . Cirrhosis (HCC)   . COPD (chronic obstructive pulmonary disease) (HCC) 12/09/2014  . Depression   . Emphysema of lung (HCC)   . GERD (gastroesophageal reflux disease)   . Heart murmur   . Hepatitis C    pt said they told her she had this last visit here  . Hyperlipidemia   . Hypertension    Assessment: Patient is a 47yo female admitted for generalized weakness, N/V and profuse diarrhea. Patient was recently hospitalized for same and was treated for infectious colitis for campylobacter and EPEC with Azithromycin. Pharmacy consulted for electrolyte management due to hypokalemia.  On admission K of 2.6, received KCl 10mEq IV x one in the ED. Magnesium level is pending.  Plan:  Will order KCl 10mEq IV x 4. Will check a K level one hour after completion of last dose. Will follow up on Magnesium level.  Clovia CuffLisa Shriyans Kuenzi, PharmD, BCPS 04/02/2018 3:16 PM

## 2018-04-02 NOTE — ED Notes (Signed)
Patient c/o nausea while coughing. Denies it at this time. States that she had 1 small bowel movement today.

## 2018-04-03 DIAGNOSIS — R197 Diarrhea, unspecified: Secondary | ICD-10-CM

## 2018-04-03 DIAGNOSIS — A071 Giardiasis [lambliasis]: Principal | ICD-10-CM

## 2018-04-03 LAB — BASIC METABOLIC PANEL
ANION GAP: 4 — AB (ref 5–15)
BUN: <5 mg/dL — ABNORMAL LOW (ref 6–20)
CHLORIDE: 106 mmol/L (ref 98–111)
CO2: 22 mmol/L (ref 22–32)
Calcium: 8 mg/dL — ABNORMAL LOW (ref 8.9–10.3)
Creatinine, Ser: 0.38 mg/dL — ABNORMAL LOW (ref 0.44–1.00)
GFR calc non Af Amer: >60 mL/min (ref >60)
Glucose, Bld: 108 mg/dL — ABNORMAL HIGH (ref 70–99)
Potassium: 3.6 mmol/L (ref 3.5–5.1)
Sodium: 132 mmol/L — ABNORMAL LOW (ref 135–145)

## 2018-04-03 MED ORDER — NICOTINE 14 MG/24HR TD PT24
14.0000 mg | MEDICATED_PATCH | Freq: Once | TRANSDERMAL | Status: AC
  Administered 2018-04-03: 14 mg via TRANSDERMAL
  Filled 2018-04-03: qty 1

## 2018-04-03 MED ORDER — MAGNESIUM SULFATE 2 GM/50ML IV SOLN
2.0000 g | Freq: Once | INTRAVENOUS | Status: AC
  Administered 2018-04-03: 2 g via INTRAVENOUS
  Filled 2018-04-03: qty 50

## 2018-04-03 MED ORDER — TINIDAZOLE 500 MG PO TABS: 2.0000 g | ORAL_TABLET | Freq: Once | ORAL | Status: DC

## 2018-04-03 MED ORDER — METRONIDAZOLE 500 MG PO TABS
500.0000 mg | ORAL_TABLET | Freq: Two times a day (BID) | ORAL | Status: DC
  Administered 2018-04-03 – 2018-04-06 (×7): 500 mg via ORAL
  Filled 2018-04-03 (×8): qty 1

## 2018-04-03 NOTE — Progress Notes (Signed)
MEDICATION RELATED CONSULT NOTE - INITIAL   Pharmacy Consult for Electrolyte Replacement/Monitoring Indication: Hypokalemia    Labs: BMP Latest Ref Rng & Units 04/02/2018 04/02/2018 03/15/2018  Glucose 70 - 99 mg/dL - 147(W124(H) 295(A116(H)  BUN 6 - 20 mg/dL - <2(Z<5(L) <3(Y<5(L)  Creatinine 0.44 - 1.00 mg/dL - 8.650.48 7.84(O0.33(L)  Sodium 135 - 145 mmol/L - 136 135  Potassium 3.5 - 5.1 mmol/L 3.0(L) 2.6(LL) 3.7  Chloride 98 - 111 mmol/L - 103 109  CO2 22 - 32 mmol/L - 21(L) 22  Calcium 8.9 - 10.3 mg/dL - 8.3(L) 8.2(L)   Potassium (mmol/L)  Date Value  04/02/2018 3.0 (L)  05/10/2013 3.4 (L)   Magnesium (mg/dL)  Date Value  96/29/528408/27/2019 1.6 (L)  08/05/2012 1.8   Calcium (mg/dL)  Date Value  13/24/401008/27/2019 8.3 (L)   Calcium, Total (mg/dL)  Date Value  27/25/366410/11/2012 8.6   Albumin (g/dL)  Date Value  40/34/742508/27/2019 2.8 (L)  05/10/2013 3.5   Phosphorus (mg/dL)  Date Value  95/63/875608/10/2017 2.6  ] Estimated Creatinine Clearance: 84.7 mL/min (by C-G formula based on SCr of 0.48 mg/dL).  Assessment: Patient is a 47yo female admitted for generalized weakness, N/V and profuse diarrhea. Patient was recently hospitalized for same and was treated for infectious colitis for campylobacter and EPEC with Azithromycin. Pharmacy consulted for electrolyte management due to hypokalemia.  On admission K of 2.6, received KCl 10mEq IV x one in the ED.  Plan:  Magnesium was 1.6 on 8/27. No replacement ordered.  Will order magnesium 2g IV x 1 dose this morning. K: 3.6 this morning. No additional replacement needed.   Will F/U with AM labs and continue to replace as needed.     Gardner CandleSheema M Kamariya Blevens, PharmD, BCPS Clinical Pharmacist 04/03/2018 5:30 AM

## 2018-04-03 NOTE — Progress Notes (Signed)
[facility] [place] [facility]   PATIENT NAME: Susan CapriceChristy [name]    MR#:  [id number]  DATE OF BIRTH:  10/11/1970  SUBJECTIVE:  CHIEF COMPLAINT: Patient is jittery and anxious.  Last alcohol intake was 3 days ago.  Reporting some abdominal discomfort and right rib pain.  Last bowel movement was yesterday evening.  Tolerating diet and denies any nausea vomiting.  REVIEW OF SYSTEMS:  CONSTITUTIONAL: No fever, fatigue or weakness.  EYES: No blurred or double vision.  EARS, NOSE, AND THROAT: No tinnitus or ear pain.  RESPIRATORY: No cough, shortness of breath, wheezing or hemoptysis.  CARDIOVASCULAR: No chest pain, orthopnea, edema.  Right-sided rib pain GASTROINTESTINAL: No nausea, vomiting, diarrhea.  Reporting epigastric abdominal pain.  GENITOURINARY: No dysuria, hematuria.  ENDOCRINE: No polyuria, nocturia,  HEMATOLOGY: No anemia, easy bruising or bleeding SKIN: No rash or lesion. MUSCULOSKELETAL: No joint pain or arthritis.   NEUROLOGIC: No tingling, numbness, weakness.  PSYCHIATRY: No anxiety or depression.   DRUG ALLERGIES:   Allergies  Allergen Reactions  . Doxycycline Hives  . Latex Hives  . Promethazine Diarrhea  . Ranitidine Nausea Only  . Zofran [Ondansetron Hcl] Itching    VITALS:  Blood pressure 107/67, pulse 79, temperature 98.5 F (36.9 C), temperature source Oral, resp. rate 20, height 5\' 3"  (1.6 m), weight 71 kg, SpO2 95 %.  PHYSICAL EXAMINATION:  GENERAL:  47 y.o.-year-old patient lying in the bed with no acute distress.  EYES: Pupils equal, round, reactive to light and accommodation. No scleral icterus. Extraocular muscles intact.  HEENT: Head atraumatic, normocephalic. Oropharynx and nasopharynx clear.  NECK:  Supple, no jugular venous distention. No thyroid enlargement, no tenderness.  LUNGS: Normal breath sounds bilaterally, no wheezing, rales,rhonchi or crepitation. No use of accessory muscles of respiration.  CARDIOVASCULAR:  S1, S2 normal. No murmurs, rubs, or gallops.  Reproducible anterior chest wall tenderness on the right side ABDOMEN: Soft, epigastric tenderness but no rebound tenderness nondistended. Bowel sounds present.  EXTREMITIES: No pedal edema, cyanosis, or clubbing.  NEUROLOGIC: Cranial nerves II through XII are intact. Muscle strength 5/5 in all extremities. Sensation intact. Gait not checked.  PSYCHIATRIC: The patient is alert and oriented x 3.  SKIN: No obvious rash, lesion, or ulcer.    LABORATORY PANEL:   CBC Recent Labs  Lab 04/02/18 1005  WBC 5.1  HGB 12.4  HCT 36.3  PLT 78*   ------------------------------------------------------------------------------------------------------------------  Chemistries  Recent Labs  Lab 04/02/18 1005 04/02/18 1533  04/03/18 0420  NA 136  --   --  132*  K 2.6*  --    < > 3.6  CL 103  --   --  106  CO2 21*  --   --  22  GLUCOSE 124*  --   --  108*  BUN <5*  --   --  <5*  CREATININE 0.48  --   --  0.38*  CALCIUM 8.3*  --   --  8.0*  MG  --  1.6*  --   --   AST 125*  --   --   --   ALT 32  --   --   --   ALKPHOS 170*  --   --   --   BILITOT 2.3*  --   --   --    < > = values in this interval not displayed.   ------------------------------------------------------------------------------------------------------------------  Cardiac Enzymes Recent Labs  Lab 04/02/18 1005  TROPONINI <0.03   ------------------------------------------------------------------------------------------------------------------  RADIOLOGY:  No results found.  EKG:   Orders placed or performed during the hospital encounter of 04/02/18  . EKG 12-Lead  . EKG 12-Lead  . ED EKG  . ED EKG    ASSESSMENT AND PLAN:   47 year old female with history of COPD, hepatitis C with liver cirrhosis and essential hypertension who presents to the emergency room due to generalized weakness, diarrhea, nausea and vomiting.  Patient was hospitalized a few weeks ago for  similar complaints and at that time was noted to have infectious colitis and treated with azithromycin.  1.  Recurring diarrhea: Secondary to Giardia Stool for C. difficile toxin is negative and GI panel is negative except for Giardia Patient is given 1 dose of tinidazole GI consult is pending  continue IV fluids and pain management as needed   2.  Hypokalemia: Check magnesium After repleting potassium is at 3.6 today. Magnesium at 1.6 will give magnesium supplements Telemetry monitor   3.  EtOH abuse: CIWA protocol  4.Tobacco dependence: Patient is encouraged to quit smoking. Counseling was provided for 4 minutes.  5.  Hepatitis C/liver cirrhosis with chronic thrombocytopenia and chronic right upper quadrant abdominal pain Continue to monitor She needs Aldactone, will start Aldactone Patient was started on rifaximin during last hospital stay may need to be continued after seen by GI   6.  Right-sided rib pain musculoskeletal pain management as needed  7.  Thrombocytopenia from liver cirrhosis No active bleeding, to monitor closely  DVT prophylaxis with SCDs   All the records are reviewed and case discussed with Care Management/Social Workerr. Management plans discussed with the patient, family and they are in agreement.  CODE STATUS: fc   TOTAL TIME TAKING CARE OF THIS PATIENT: 36  minutes.   POSSIBLE D/C IN 2 DAYS, DEPENDING ON CLINICAL CONDITION.  Note: This dictation was prepared with Dragon dictation along with smaller phrase technology. Any transcriptional errors that result from this process are unintentional.   Ramonita Lab M.D on 04/03/2018 at 9:48 AM  Between 7am to 6pm - Pager - [phone] After 6pm go to [email or web address] - password [email or web address] [facility]  [place]  Hospitalists  Office  [phone]  CC: Primary care physician; [name], MD

## 2018-04-03 NOTE — Consult Note (Signed)
Midge Miniumarren Yadhira Mckneely, MD [facility]  [address] [place], [place] [address] Phone: [phone] Fax : [phone]  Consultation  Referring Provider:     Dr. Amado CoeGouru Primary Care Physician:  Sherron Mondayejan-Sie, S Ahmed, MD Primary Gastroenterologist:  Dr. Maximino Greenlandahiliani         Reason for Consultation:     Diahrrea  Date of Admission:  04/02/2018 Date of Consultation:  04/03/2018         HPI:   Susan [name] is a 47 y.o. female who has a history of diarrhea.  The patient was in the hospital earlier this month for diarrhea and was found to have Campylobacter and E. coli.  The patient was sent home and states that her diarrhea improved greatly.  Since then the patient states that she started to have diarrhea over the last few days and so profusely with abdominal discomfort that she came to the ER.  A repeat GI panel showed her to have Giardia.  The patient has had a consult by pharmacy for dosing of the medication to treat this and is being followed by the hospitalist and pharmacy.  The patient denies any black stools or bloody stools.  She also reports that she has been dealing with a lot of stray kittens.  Patient does have a history of hepatitis C.  Past Medical History:  Diagnosis Date  . Allergy   . Anemia   . Anxiety   . Asthma   . Cirrhosis (HCC)   . COPD (chronic obstructive pulmonary disease) (HCC) 12/09/2014  . Depression   . Emphysema of lung (HCC)   . GERD (gastroesophageal reflux disease)   . Heart murmur   . Hepatitis C    pt said they told her she had this last visit here  . Hyperlipidemia   . Hypertension     Past Surgical History:  Procedure Laterality Date  . BREAST CYST ASPIRATION Right 12/2017   abscess aspiration  . CESAREAN SECTION  2009    Prior to Admission medications   Medication Sig Start Date End Date Taking? Authorizing Provider  albuterol (PROVENTIL HFA;VENTOLIN HFA) 108 (90 Base) MCG/ACT inhaler Inhale 1 puff into the lungs every 4 (four) hours as needed for  wheezing or shortness of breath. 05/04/17  Yes Auburn BilberryPatel, Shreyang, MD  aluminum-magnesium hydroxide-simethicone (MAALOX) 200-200-20 MG/5ML SUSP Take 30 mLs 4 (four) times daily -  before meals and at bedtime by mouth. 06/15/17  Yes Sharman CheekStafford, Phillip, MD  budesonide-formoterol [facility](SYMBICORT) 80-4.5 MCG/ACT inhaler Inhale 2 puffs into the lungs 2 (two) times daily. 05/04/17  Yes Auburn BilberryPatel, Shreyang, MD  clonazePAM (KLONOPIN) 1 MG tablet Take 1 tablet (1 mg total) by mouth 2 (two) times daily. 05/04/17  Yes Auburn BilberryPatel, Shreyang, MD  escitalopram (LEXAPRO) 20 MG tablet Take 1 tablet (20 mg total) by mouth at bedtime. 05/04/17  Yes Auburn BilberryPatel, Shreyang, MD  furosemide (LASIX) 40 MG tablet Take 1 tablet (40 mg total) by mouth 2 (two) times daily. 05/04/17  Yes Auburn BilberryPatel, Shreyang, MD  hydrOXYzine (ATARAX/VISTARIL) 25 MG tablet Take 1 tablet (25 mg total) by mouth 3 (three) times daily as needed (itching). 04/07/15  Yes Sudini, Wardell HeathSrikar, MD  ipratropium-albuterol (DUONEB) 0.5-2.5 (3) MG/3ML SOLN Take 3 mLs by nebulization every 6 (six) hours as needed (for shortness of breath/wheezing).    Yes [provider]  loratadine (CLARITIN) 10 MG tablet Take 1 tablet (10 mg total) by mouth daily. 05/04/17  Yes Auburn BilberryPatel, Shreyang, MD  pantoprazole (PROTONIX) 40 MG tablet Take  1 tablet (40 mg total) by mouth daily. 10/29/17 04/02/18 Yes Darci Current, MD  Potassium Chloride ER 20 MEQ TBCR Take 40 mEq by mouth daily. 05/04/17  Yes Auburn Bilberry, MD  spironolactone (ALDACTONE) 100 MG tablet Take 1 tablet (100 mg total) by mouth daily. 05/04/17  Yes Auburn Bilberry, MD  metoCLOPramide (REGLAN) 10 MG tablet Take 1 tablet (10 mg total) every 6 (six) hours as needed by mouth. Patient not taking: Reported on 04/02/2018 06/15/17   Sharman Cheek, MD  rifaximin (XIFAXAN) 550 MG TABS tablet Take 1 tablet (550 mg total) by mouth 2 (two) times daily. Patient not taking: Reported on 04/02/2018 03/15/18   Mayo, Allyn Kenner, MD  traMADol (ULTRAM) 50 MG tablet  Take 1 tablet (50 mg total) by mouth every 6 (six) hours. Patient not taking: Reported on 04/02/2018 03/15/18   Mayo, Allyn Kenner, MD    Family History  Problem Relation Age of Onset  . Other Mother   . Lung cancer Mother   . Skin cancer Father   . Breast cancer Maternal Grandmother 27     Social History   Tobacco Use  . Smoking status: Current Every Day Smoker    Packs/day: 1.00    Years: 20.00    Pack years: 20.00    Types: Cigarettes  . Smokeless tobacco: Never Used  Substance Use Topics  . Alcohol use: No    Comment: Quit drinking few years ago.  . Drug use: No    Allergies as of 04/02/2018 - Review Complete 04/02/2018  Allergen Reaction Noted  . Doxycycline Hives 12/09/2014  . Latex Hives 12/09/2014  . Promethazine Diarrhea 12/09/2014  . Ranitidine Nausea Only 12/09/2014  . Zofran [ondansetron hcl] Itching 12/09/2014    Review of Systems:    All systems reviewed and negative except where noted in HPI.   Physical Exam:  Vital signs in last 24 hours: Temp:  [98.2 F (36.8 C)-98.9 F (37.2 C)] 98.5 F (36.9 C) (08/28 0453) Pulse Rate:  [79-105] 79 (08/28 0900) Resp:  [18-20] 20 (08/28 0453) BP: (92-115)/(62-68) 107/67 (08/28 0900) SpO2:  [94 %-97 %] 95 % (08/28 0453) Weight:  [71 kg] 71 kg (08/27 1506) Last BM Date: 04/02/18 General:   Pleasant, cooperative in NAD Head:  Normocephalic and atraumatic. Eyes:   No icterus.   Conjunctiva pink. PERRLA. Ears:  Normal auditory acuity. Neck:  Supple; no masses or thyroidomegaly Lungs: Respirations even and unlabored. Lungs clear to auscultation bilaterally.   No wheezes, crackles, or rhonchi.  Heart:  Regular rate and rhythm;  Without murmur, clicks, rubs or gallops Abdomen:  Soft, nondistended, nontender. Normal bowel sounds. No appreciable masses or hepatomegaly.  No rebound or guarding.  Rectal:  Not performed. Msk:  Symmetrical without gross deformities.    Extremities:  Without edema, cyanosis or  clubbing. Neurologic:  Alert and oriented x3;  grossly normal neurologically. Skin:  Intact without significant lesions or rashes. Cervical Nodes:  No significant cervical adenopathy. Psych:  Alert and cooperative. Normal affect.  LAB RESULTS: Recent Labs    04/02/18 1005  WBC 5.1  HGB 12.4  HCT 36.3  PLT 78*   BMET Recent Labs    04/02/18 1005 04/02/18 2101 04/03/18 0420  NA 136  --  132*  K 2.6* 3.0* 3.6  CL 103  --  106  CO2 21*  --  22  GLUCOSE 124*  --  108*  BUN <5*  --  <5*  CREATININE 0.48  --  0.38*  CALCIUM 8.3*  --  8.0*   LFT Recent Labs    04/02/18 1005  PROT 8.5*  ALBUMIN 2.8*  AST 125*  ALT 32  ALKPHOS 170*  BILITOT 2.3*   PT/INR No results for input(s): LABPROT, INR in the last 72 hours.  STUDIES: No results found.    Impression / Plan:   Assessment: Active Problems:   Hypokalemia   Susan [name] is a 47 y.o. y/o female with recurrent diarrhea.  The patient's previous admission was due to Low back to her any coli.  The patient is now diagnosed with Giardia.  She is on treatment for the Giardia.  Plan: Plan for this patient is to treat the Giardia and she has been advised to make sure that her straight catheter not the source of her recurrent infections with different pathogens.  Nothing further to do from a GI point of view. I will sign off.  Please call if any further GI concerns or questions.  We would like to thank you for the opportunity to participate in the care of [employer] .    Thank you for involving me in the care of this patient.      LOS: 1 day   Midge Minium, MD  04/03/2018, 1:43 PM    Note: This dictation was prepared with Dragon dictation along with smaller phrase technology. Any transcriptional errors that result from this process are unintentional.

## 2018-04-03 NOTE — Progress Notes (Signed)
Pharmacy Antibiotic Note  Susan [name] is a 47 y.o. female admitted on 04/02/2018 with GI infection with giardia lamblia.  Pharmacy has been consulted for tinidazole dosing.  Plan: Tinidazole ordered for convenience of one dose. This medication is unavailable in the health system. Will order metronidazole 500 mg twice daily for 5 days.  Height: 5\' 3"  (160 cm) Weight: 156 lb 8.4 oz (71 kg) IBW/kg (Calculated) : 52.4  Temp (24hrs), Avg:98.5 F (36.9 C), Min:98.2 F (36.8 C), Max:98.9 F (37.2 C)  Recent Labs  Lab 04/02/18 1005 04/03/18 0420  WBC 5.1  --   CREATININE 0.48 0.38*    Estimated Creatinine Clearance: 84.7 mL/min (A) (by C-G formula based on SCr of 0.38 mg/dL (L)).    Allergies  Allergen Reactions  . Doxycycline Hives  . Latex Hives  . Promethazine Diarrhea  . Ranitidine Nausea Only  . Zofran [Ondansetron Hcl] Itching    Antimicrobials this admission: Metronidazole 8/27 >>  Microbiology results: 8/27 GI Panel: positive for giardia lamblia 8/27 C. Diff: negative  Thank you for allowing pharmacy to be a part of this patient's care.  Pricilla RiffleAbby K Ellington, PharmD Pharmacy Resident  04/03/2018 10:20 AM

## 2018-04-04 ENCOUNTER — Inpatient Hospital Stay: Payer: Medicaid Other

## 2018-04-04 LAB — BASIC METABOLIC PANEL
Anion gap: 4 — ABNORMAL LOW (ref 5–15)
CHLORIDE: 109 mmol/L (ref 98–111)
CO2: 22 mmol/L (ref 22–32)
CREATININE: 0.34 mg/dL — AB (ref 0.44–1.00)
Calcium: 8.1 mg/dL — ABNORMAL LOW (ref 8.9–10.3)
GFR calc Af Amer: >60 mL/min (ref >60)
GFR calc non Af Amer: >60 mL/min (ref >60)
GLUCOSE: 118 mg/dL — AB (ref 70–99)
POTASSIUM: 4 mmol/L (ref 3.5–5.1)
SODIUM: 135 mmol/L (ref 135–145)

## 2018-04-04 LAB — MAGNESIUM: Magnesium: 1.9 mg/dL (ref 1.7–2.4)

## 2018-04-04 MED ORDER — TRAMADOL HCL 50 MG PO TABS
50.0000 mg | ORAL_TABLET | Freq: Four times a day (QID) | ORAL | Status: DC | PRN
  Administered 2018-04-04 – 2018-04-06 (×4): 50 mg via ORAL
  Filled 2018-04-04 (×4): qty 1

## 2018-04-04 MED ORDER — OXYCODONE HCL 5 MG PO TABS
5.0000 mg | ORAL_TABLET | Freq: Two times a day (BID) | ORAL | Status: DC | PRN
  Administered 2018-04-04 – 2018-04-05 (×3): 5 mg via ORAL
  Filled 2018-04-04 (×4): qty 1

## 2018-04-04 MED ORDER — TRAMADOL HCL 50 MG PO TABS
50.0000 mg | ORAL_TABLET | Freq: Once | ORAL | Status: AC
  Administered 2018-04-04: 50 mg via ORAL
  Filled 2018-04-04: qty 1

## 2018-04-04 MED ORDER — OXYCODONE HCL 5 MG PO TABS
5.0000 mg | ORAL_TABLET | Freq: Two times a day (BID) | ORAL | Status: DC | PRN
  Administered 2018-04-04: 5 mg via ORAL
  Filled 2018-04-04: qty 1

## 2018-04-04 MED ORDER — LORAZEPAM 2 MG/ML IJ SOLN
2.0000 mg | Freq: Once | INTRAMUSCULAR | Status: AC
  Administered 2018-04-04: 2 mg via INTRAVENOUS
  Filled 2018-04-04: qty 1

## 2018-04-04 MED ORDER — FUROSEMIDE 40 MG PO TABS
40.0000 mg | ORAL_TABLET | Freq: Two times a day (BID) | ORAL | Status: DC
  Administered 2018-04-04 – 2018-04-06 (×3): 40 mg via ORAL
  Filled 2018-04-04 (×3): qty 1

## 2018-04-04 NOTE — Progress Notes (Signed)
[facility] [place] [facility]   PATIENT NAME: Susan CapriceChristy Zamudio    MR#:  [id number]  DATE OF BIRTH:  06/05/1971  SUBJECTIVE:  CHIEF COMPLAINT: Patient little more awake still tremulous.  Diarrhea improved  REVIEW OF SYSTEMS:  CONSTITUTIONAL: No fever, fatigue or weakness.  EYES: No blurred or double vision.  EARS, NOSE, AND THROAT: No tinnitus or ear pain.  RESPIRATORY: No cough, shortness of breath, wheezing or hemoptysis.  CARDIOVASCULAR: No chest pain, orthopnea, edema.  Right-sided rib pain GASTROINTESTINAL: No nausea, vomiting, diarrhea.  Reporting epigastric abdominal pain.  GENITOURINARY: No dysuria, hematuria.  ENDOCRINE: No polyuria, nocturia,  HEMATOLOGY: No anemia, easy bruising or bleeding SKIN: No rash or lesion. MUSCULOSKELETAL: No joint pain or arthritis.   NEUROLOGIC: No tingling, numbness, weakness.  PSYCHIATRY: No anxiety or depression.   DRUG ALLERGIES:   Allergies  Allergen Reactions  . Doxycycline Hives  . Latex Hives  . Promethazine Diarrhea  . Ranitidine Nausea Only  . Zofran [Ondansetron Hcl] Itching    VITALS:  Blood pressure 121/73, pulse 89, temperature 98 F (36.7 C), temperature source Oral, resp. rate 20, height 5\' 3"  (1.6 m), weight 77.6 kg, SpO2 99 %.  PHYSICAL EXAMINATION:  GENERAL:  47 y.o.-year-old patient lying in the bed with no acute distress.  EYES: Pupils equal, round, reactive to light and accommodation. No scleral icterus. Extraocular muscles intact.  HEENT: Head atraumatic, normocephalic. Oropharynx and nasopharynx clear.  NECK:  Supple, no jugular venous distention. No thyroid enlargement, no tenderness.  LUNGS: Normal breath sounds bilaterally, no wheezing, rales,rhonchi or crepitation. No use of accessory muscles of respiration.  CARDIOVASCULAR: S1, S2 normal. No murmurs, rubs, or gallops.  Reproducible anterior chest wall tenderness on the right side ABDOMEN: Soft, epigastric tenderness but no  rebound tenderness nondistended. Bowel sounds present.  EXTREMITIES: No pedal edema, cyanosis, or clubbing.  NEUROLOGIC: Cranial nerves II through XII are intact. Muscle strength 5/5 in all extremities. Sensation intact. Gait not checked.  PSYCHIATRIC: The patient is alert and oriented x 3.  SKIN: No obvious rash, lesion, or ulcer.    LABORATORY PANEL:   CBC Recent Labs  Lab 04/02/18 1005  WBC 5.1  HGB 12.4  HCT 36.3  PLT 78*   ------------------------------------------------------------------------------------------------------------------  Chemistries  Recent Labs  Lab 04/02/18 1005  04/04/18 0424  NA 136   < > 135  K 2.6*   < > 4.0  CL 103   < > 109  CO2 21*   < > 22  GLUCOSE 124*   < > 118*  BUN <5*   < > <5*  CREATININE 0.48   < > 0.34*  CALCIUM 8.3*   < > 8.1*  MG  --    < > 1.9  AST 125*  --   --   ALT 32  --   --   ALKPHOS 170*  --   --   BILITOT 2.3*  --   --    < > = values in this interval not displayed.   ------------------------------------------------------------------------------------------------------------------  Cardiac Enzymes Recent Labs  Lab 04/02/18 1005  TROPONINI <0.03   ------------------------------------------------------------------------------------------------------------------  RADIOLOGY:  [place] Abdomen Limited  Result Date: 04/04/2018 CLINICAL DATA:  Hepatitis-C.  Assess for ascites. EXAM: LIMITED ABDOMEN ULTRASOUND FOR ASCITES TECHNIQUE: Limited ultrasound survey for ascites was performed in all four abdominal quadrants. COMPARISON:  Liver ultrasound March 10, 2018 and abdominal ultrasound March 10, 2018 FINDINGS: No free fluid identified within the 4 interrogated abdominal quadrants.  IMPRESSION: No ascites identified. Electronically Signed   By: Awilda Metro M.D.   On: 04/04/2018 13:58    EKG:   Orders placed or performed during the hospital encounter of 04/02/18  . EKG 12-Lead  . EKG 12-Lead  . ED EKG  . ED EKG  .  EKG    ASSESSMENT AND PLAN:   47 year old female with history of COPD, hepatitis C with liver cirrhosis and essential hypertension who presents to the emergency room due to generalized weakness, diarrhea, nausea and vomiting.  Patient was hospitalized a few weeks ago for similar complaints and at that time was noted to have infectious colitis and treated with azithromycin.  1.  Recurring diarrhea: Secondary to Giardia Stool for C. difficile toxin is negative and GI panel is negative except for Giardia Patient is given 1 dose of tinidazole Continue Flagyl Appreciate GI consult    2.  Hypokalemia: Check magnesium After repleting potassium is at 3.6 today. Continue to replace  3.  EtOH abuse: CIWA protocol  4.Tobacco dependence: Patient is encouraged to quit smoking. Counseling was provided for 4 minutes.  5.  Hepatitis C/liver cirrhosis with chronic thrombocytopenia and chronic right upper quadrant abdominal pain Continue to monitor Patient states that she feels like she has ascites I will check ultrasound of her abdomen   6.  Right-sided rib pain musculoskeletal pain management as needed  7.  Thrombocytopenia from liver cirrhosis No active bleeding, to monitor closely  DVT prophylaxis with SCDs   All the records are reviewed and case discussed with Care Management/Social Workerr. Management plans discussed with the patient, family and they are in agreement.  CODE STATUS: fc   TOTAL TIME TAKING CARE OF THIS PATIENT: 35 minutes.   POSSIBLE D/C IN 2 DAYS, DEPENDING ON CLINICAL CONDITION.  Note: This dictation was prepared with Dragon dictation along with smaller phrase technology. Any transcriptional errors that result from this process are unintentional.   Auburn Bilberry M.D on 04/04/2018 at 2:30 PM  Between 7am to 6pm - Pager - [phone] After 6pm go to [email or web address] - password [email or web address] [facility]  [facility]  Office  [phone]  CC: Primary care  physician; [name], MD

## 2018-04-04 NOTE — Progress Notes (Signed)
Pharmacy Electrolyte Replacement Consult  Pharmacy has been consulted for replacement of electrolytes in this 47 year old female admitted with hypokalemia.  Labs: 8/29 K 4         Mg 1.9  Assessment/Plan: Electrolytes today WNL. Patient does not require supplementation today.  Pharmacy will continue to follow patient.  Pricilla RiffleAbby K Nicholi Ghuman, PharmD Pharmacy Resident  04/04/2018 10:41 AM

## 2018-04-05 ENCOUNTER — Inpatient Hospital Stay: Payer: Medicaid Other

## 2018-04-05 LAB — BASIC METABOLIC PANEL
ANION GAP: 3 — AB (ref 5–15)
BUN: <5 mg/dL — ABNORMAL LOW (ref 6–20)
CHLORIDE: 109 mmol/L (ref 98–111)
CO2: 25 mmol/L (ref 22–32)
CREATININE: 0.32 mg/dL — AB (ref 0.44–1.00)
Calcium: 8.5 mg/dL — ABNORMAL LOW (ref 8.9–10.3)
GFR calc non Af Amer: >60 mL/min (ref >60)
Glucose, Bld: 102 mg/dL — ABNORMAL HIGH (ref 70–99)
POTASSIUM: 3.6 mmol/L (ref 3.5–5.1)
Sodium: 137 mmol/L (ref 135–145)

## 2018-04-05 MED ORDER — GUAIFENESIN-DM 100-10 MG/5ML PO SYRP: 5.0000 mL | ORAL_SOLUTION | ORAL | Status: DC | PRN

## 2018-04-05 MED ORDER — METRONIDAZOLE 500 MG PO TABS: 500.0000 mg | ORAL_TABLET | Freq: Two times a day (BID) | ORAL | 0 refills | Status: AC

## 2018-04-05 MED ORDER — TRAMADOL HCL 50 MG PO TABS: 50.0000 mg | ORAL_TABLET | Freq: Four times a day (QID) | ORAL | 0 refills | Status: AC | PRN

## 2018-04-05 MED ORDER — CHLORDIAZEPOXIDE HCL 5 MG PO CAPS: 5.0000 mg | ORAL_CAPSULE | Freq: Three times a day (TID) | ORAL | 0 refills | Status: DC | PRN

## 2018-04-05 MED ORDER — LORAZEPAM 1 MG PO TABS
1.0000 mg | ORAL_TABLET | Freq: Four times a day (QID) | ORAL | Status: DC | PRN
  Administered 2018-04-05 – 2018-04-06 (×3): 1 mg via ORAL
  Filled 2018-04-05 (×4): qty 1

## 2018-04-05 NOTE — Progress Notes (Signed)
[facility] [place] [facility]   PATIENT NAME: Susan CapriceChristy Gauss    MR#:  [id number]  DATE OF BIRTH:  03/27/1971  SUBJECTIVE:  CHIEF COMPLAINT: Continues to complain of nausea vomiting  REVIEW OF SYSTEMS:  CONSTITUTIONAL: No fever, fatigue or weakness.  EYES: No blurred or double vision.  EARS, NOSE, AND THROAT: No tinnitus or ear pain.  RESPIRATORY: No cough, shortness of breath, wheezing or hemoptysis.  CARDIOVASCULAR: No chest pain, orthopnea, edema.  Right-sided rib pain GASTROINTESTINAL: Positive nausea, vomiting, diarrhea.  Reporting epigastric abdominal pain.  GENITOURINARY: No dysuria, hematuria.  ENDOCRINE: No polyuria, nocturia,  HEMATOLOGY: No anemia, easy bruising or bleeding SKIN: No rash or lesion. MUSCULOSKELETAL: No joint pain or arthritis.   NEUROLOGIC: No tingling, numbness, weakness.  PSYCHIATRY: No anxiety or depression.   DRUG ALLERGIES:   Allergies  Allergen Reactions  . Doxycycline Hives  . Latex Hives  . Promethazine Diarrhea  . Ranitidine Nausea Only  . Zofran [Ondansetron Hcl] Itching    VITALS:  Blood pressure 127/81, pulse 79, temperature 98.4 F (36.9 C), temperature source Oral, resp. rate (!) 25, height 5\' 3"  (1.6 m), weight 74.3 kg, SpO2 99 %.  PHYSICAL EXAMINATION:  GENERAL:  47 y.o.-year-old patient lying in the bed with no acute distress.  EYES: Pupils equal, round, reactive to light and accommodation. No scleral icterus. Extraocular muscles intact.  HEENT: Head atraumatic, normocephalic. Oropharynx and nasopharynx clear.  NECK:  Supple, no jugular venous distention. No thyroid enlargement, no tenderness.  LUNGS: Normal breath sounds bilaterally, no wheezing, rales,rhonchi or crepitation. No use of accessory muscles of respiration.  CARDIOVASCULAR: S1, S2 normal. No murmurs, rubs, or gallops.  Reproducible anterior chest wall tenderness on the right side ABDOMEN: Soft, epigastric tenderness but no rebound  tenderness nondistended. Bowel sounds present.  EXTREMITIES: No pedal edema, cyanosis, or clubbing.  NEUROLOGIC: Cranial nerves II through XII are intact. Muscle strength 5/5 in all extremities. Sensation intact. Gait not checked.  PSYCHIATRIC: The patient is alert and oriented x 3.  SKIN: No obvious rash, lesion, or ulcer.    LABORATORY PANEL:   CBC Recent Labs  Lab 04/02/18 1005  WBC 5.1  HGB 12.4  HCT 36.3  PLT 78*   ------------------------------------------------------------------------------------------------------------------  Chemistries  Recent Labs  Lab 04/02/18 1005  04/04/18 0424 04/05/18 0540  NA 136   < > 135 137  K 2.6*   < > 4.0 3.6  CL 103   < > 109 109  CO2 21*   < > 22 25  GLUCOSE 124*   < > 118* 102*  BUN <5*   < > <5* <5*  CREATININE 0.48   < > 0.34* 0.32*  CALCIUM 8.3*   < > 8.1* 8.5*  MG  --    < > 1.9  --   AST 125*  --   --   --   ALT 32  --   --   --   ALKPHOS 170*  --   --   --   BILITOT 2.3*  --   --   --    < > = values in this interval not displayed.   ------------------------------------------------------------------------------------------------------------------  Cardiac Enzymes Recent Labs  Lab 04/02/18 1005  TROPONINI <0.03   ------------------------------------------------------------------------------------------------------------------  RADIOLOGY:  Dg Chest 2 View  Result Date: 04/05/2018 CLINICAL DATA:  Shortness of breath, history of tobacco use EXAM: CHEST - 2 VIEW COMPARISON:  04/04/2018 FINDINGS: Cardiac shadow is within normal limits. The lungs  are well aerated bilaterally. Minimal blunting of the right costophrenic angle is noted with mild right lower lobe atelectasis. No pneumothorax is noted. No bony abnormality is seen. IMPRESSION: Mild right basilar changes as described. Electronically Signed   By: Alcide Clever M.D.   On: 04/05/2018 08:29   US Abdomen Limited  Result Date: 04/04/2018 CLINICAL DATA:   Hepatitis-C.  Assess for ascites. EXAM: LIMITED ABDOMEN ULTRASOUND FOR ASCITES TECHNIQUE: Limited ultrasound survey for ascites was performed in all four abdominal quadrants. COMPARISON:  Liver ultrasound March 10, 2018 and abdominal ultrasound March 10, 2018 FINDINGS: No free fluid identified within the 4 interrogated abdominal quadrants. IMPRESSION: No ascites identified. Electronically Signed   By: Awilda Metro M.D.   On: 04/04/2018 13:58   Dg Chest Port 1 View  Result Date: 04/04/2018 CLINICAL DATA:  47 year old female with shortness of breath. Hepatitis. EXAM: PORTABLE CHEST 1 VIEW COMPARISON:  Chest radiographs 01/04/2017 and earlier. FINDINGS: Portable AP semi upright view at 1445 hours. Lung volumes and mediastinal contours are stable and within normal limits. Allowing for portable technique the lungs are clear. Visualized tracheal air column is within normal limits. Negative visible bowel gas pattern. No acute osseous abnormality identified. IMPRESSION: Negative portable chest.  No acute cardiopulmonary abnormality. Electronically Signed   By: Odessa Fleming M.D.   On: 04/04/2018 16:32    EKG:   Orders placed or performed during the hospital encounter of 04/02/18  . EKG 12-Lead  . EKG 12-Lead  . ED EKG  . ED EKG  . EKG    ASSESSMENT AND PLAN:   47 year old female with history of COPD, hepatitis C with liver cirrhosis and essential hypertension who presents to the emergency room due to generalized weakness, diarrhea, nausea and vomiting.  Patient was hospitalized a few weeks ago for similar complaints and at that time was noted to have infectious colitis and treated with azithromycin.  1.  Recurring diarrhea: Secondary to Giardia Stool for C. difficile toxin is negative and GI panel is negative except for Giardia Patient is given 1 dose of tinidazole Continue Flagyl Appreciate GI consult   2.  Hypokalemia: Check magnesium After repleting potassium is at 3.6 today. Continue to  replace  3.  EtOH abuse: CIWA protocol  4.Tobacco dependence: Patient is encouraged to quit smoking. Counseling was provided for 4 minutes.  5.  Hepatitis C/liver cirrhosis with chronic thrombocytopenia and chronic right upper quadrant abdominal pain Continue to monitor Patient states that she feels like she has ascites I will check ultrasound of her abdomen  6.  Right-sided rib pain musculoskeletal pain management as needed  7.  Thrombocytopenia from liver cirrhosis No active bleeding, to monitor closely  DVT prophylaxis with SCDs   All the records are reviewed and case discussed with Care Management/Social Workerr. Management plans discussed with the patient, family and they are in agreement.  CODE STATUS: fc   TOTAL TIME TAKING CARE OF THIS PATIENT: 35 minutes.   POSSIBLE D/C IN 2 DAYS, DEPENDING ON CLINICAL CONDITION.  Note: This dictation was prepared with Dragon dictation along with smaller phrase technology. Any transcriptional errors that result from this process are unintentional.   Auburn Bilberry M.D on 04/05/2018 at 4:07 PM  Between 7am to 6pm - Pager - [phone] After 6pm go to [email or web address] - password [email or web address] [facility]  [place] [facility] [place] [facility]  Office  [phone]  CC: Primary care physician; [name], MD

## 2018-04-06 MED ORDER — OXYCODONE HCL 5 MG PO TABS: 5.0000 mg | ORAL_TABLET | Freq: Two times a day (BID) | ORAL | 0 refills | Status: AC | PRN

## 2018-04-06 NOTE — Discharge Summary (Signed)
[facility] - [place] at [facility], 46 y.o., DOB 06/24/1971, MRN [id number]. 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  Patient continuing to drink alcohol.  She was evaluated and admitted work-up including C. difficile was negative.  Stool studies showed positive Giardia.  Patient continued to complain of abdominal distention and ultrasound of the abdomen which was negative for ascites.  She complained of cough congestion chest x-ray was negative.  Her diarrhea is now resolved.  I strongly recommended her stop drinking.           Consults  GI  Significant Tests:  See full reports for all details     Dg Chest 2 View  Result Date: 04/05/2018 CLINICAL DATA:  Shortness of breath, history of tobacco use EXAM: CHEST - 2 VIEW COMPARISON:  04/04/2018 FINDINGS: Cardiac shadow is within normal limits. The lungs are well aerated bilaterally. Minimal blunting of the right costophrenic angle is noted with mild right lower lobe atelectasis. No pneumothorax is noted. No bony abnormality is seen. IMPRESSION: Mild right basilar changes as described. Electronically Signed   By: Alcide Clever M.D.   On: 04/05/2018 08:29   Mr 3d Recon At Scanner  Result Date: 03/10/2018 CLINICAL DATA:  Abnormal LFTs.  Dilated CBD on ultrasound. EXAM: MRI  ABDOMEN WITHOUT AND WITH CONTRAST (INCLUDING MRCP) TECHNIQUE: Multiplanar multisequence MR imaging of the abdomen was performed both before and after the administration of intravenous contrast. Heavily T2-weighted images of the biliary and pancreatic ducts were obtained, and three-dimensional MRCP images were rendered by post processing. CONTRAST:  15mL MULTIHANCE GADOBENATE DIMEGLUMINE 529 MG/ML IV SOLN COMPARISON:  Right upper quadrant ultrasound dated 03/10/2018. MRI abdomen dated 04/30/2017. FINDINGS: Lower chest: Lung bases are clear. Hepatobiliary: Cirrhosis. Mild hepatic steatosis. Lace-like T2 hyperintensity throughout the liver, reflecting fibrosis. Additionally, following contrast administration, there are numerable nonenhancing nodules throughout the liver (series 21), reflecting regenerating nodules. No enhancing foci to suggest dysplastic nodules. No suspicious/enhancing lesions suggestive of hepatocellular carcinoma. Tiny layering gallstone (series 5/image 19), without associated inflammatory changes. No intrahepatic ductal dilatation. Dilated common duct, measuring 11 mm centrally (series 10/image 10), unchanged from the prior and smoothly tapering at the ampulla. No choledocholithiasis is seen. Pancreas:  Within normal limits. Spleen:  Spleen is normal in size. Adrenals/Urinary Tract:  Adrenal glands are within normal limits. 4 mm left upper pole renal cyst (series 5/image 20). Right kidney is within normal limits. No hydronephrosis. Stomach/Bowel: Stomach is within normal limits. Mild left colonic wall thickening involving the proximal descending colon (series 10/image 13), poorly evaluated on MRI. Correlate for infectious/inflammatory colitis. Vascular/Lymphatic:  No evidence of abdominal aortic aneurysm. No suspicious abdominal lymphadenopathy. Other:  No abdominal ascites. Musculoskeletal: No focal osseous lesions. IMPRESSION: Stable dilatation of the common duct, measuring 11 mm. No  choledocholithiasis is seen. Mild cholelithiasis, without associated inflammatory changes. Cirrhosis. Mild hepatic steatosis. Lace-like fibrosis and numerous regenerating nodules. No evidence of hepatocellular carcinoma. Wall thickening involving the proximal descending colon, poorly  evaluated on MRI. Correlate for infectious/inflammatory colitis. Electronically Signed   By: Charline Bills M.D.   On: 03/10/2018 18:50   US Abdomen Limited  Result Date: 04/04/2018 CLINICAL DATA:  Hepatitis-C.  Assess for ascites. EXAM: LIMITED ABDOMEN ULTRASOUND FOR ASCITES TECHNIQUE: Limited ultrasound survey for ascites was performed in all four abdominal quadrants. COMPARISON:  Liver ultrasound March 10, 2018 and abdominal ultrasound March 10, 2018 FINDINGS: No free fluid identified within the 4 interrogated abdominal quadrants. IMPRESSION: No ascites identified. Electronically Signed   By: Awilda Metro M.D.   On: 04/04/2018 13:58   Dg Chest Port 1 View  Result Date: 04/04/2018 CLINICAL DATA:  47 year old female with shortness of breath. Hepatitis. EXAM: PORTABLE CHEST 1 VIEW COMPARISON:  Chest radiographs 01/04/2017 and earlier. FINDINGS: Portable AP semi upright view at 1445 hours. Lung volumes and mediastinal contours are stable and within normal limits. Allowing for portable technique the lungs are clear. Visualized tracheal air column is within normal limits. Negative visible bowel gas pattern. No acute osseous abnormality identified. IMPRESSION: Negative portable chest.  No acute cardiopulmonary abnormality. Electronically Signed   By: Odessa Fleming M.D.   On: 04/04/2018 16:32   Mr Abdomen Mrcp Vivien Rossetti Contast  Result Date: 03/10/2018 CLINICAL DATA:  Abnormal LFTs.  Dilated CBD on ultrasound. EXAM: MRI ABDOMEN WITHOUT AND WITH CONTRAST (INCLUDING MRCP) TECHNIQUE: Multiplanar multisequence MR imaging of the abdomen was performed both before and after the administration of intravenous contrast. Heavily T2-weighted  images of the biliary and pancreatic ducts were obtained, and three-dimensional MRCP images were rendered by post processing. CONTRAST:  15mL MULTIHANCE GADOBENATE DIMEGLUMINE 529 MG/ML IV SOLN COMPARISON:  Right upper quadrant ultrasound dated 03/10/2018. MRI abdomen dated 04/30/2017. FINDINGS: Lower chest: Lung bases are clear. Hepatobiliary: Cirrhosis. Mild hepatic steatosis. Lace-like T2 hyperintensity throughout the liver, reflecting fibrosis. Additionally, following contrast administration, there are numerable nonenhancing nodules throughout the liver (series 21), reflecting regenerating nodules. No enhancing foci to suggest dysplastic nodules. No suspicious/enhancing lesions suggestive of hepatocellular carcinoma. Tiny layering gallstone (series 5/image 19), without associated inflammatory changes. No intrahepatic ductal dilatation. Dilated common duct, measuring 11 mm centrally (series 10/image 10), unchanged from the prior and smoothly tapering at the ampulla. No choledocholithiasis is seen. Pancreas:  Within normal limits. Spleen:  Spleen is normal in size. Adrenals/Urinary Tract:  Adrenal glands are within normal limits. 4 mm left upper pole renal cyst (series 5/image 20). Right kidney is within normal limits. No hydronephrosis. Stomach/Bowel: Stomach is within normal limits. Mild left colonic wall thickening involving the proximal descending colon (series 10/image 13), poorly evaluated on MRI. Correlate for infectious/inflammatory colitis. Vascular/Lymphatic:  No evidence of abdominal aortic aneurysm. No suspicious abdominal lymphadenopathy. Other:  No abdominal ascites. Musculoskeletal: No focal osseous lesions. IMPRESSION: Stable dilatation of the common duct, measuring 11 mm. No choledocholithiasis is seen. Mild cholelithiasis, without associated inflammatory changes. Cirrhosis. Mild hepatic steatosis. Lace-like fibrosis and numerous regenerating nodules. No evidence of hepatocellular carcinoma. Wall  thickening involving the proximal descending colon, poorly evaluated on MRI. Correlate for infectious/inflammatory colitis. Electronically Signed   By: Charline Bills M.D.   On: 03/10/2018 18:50   US Abdomen Limited Ruq  Result Date: 03/10/2018 CLINICAL DATA:  Upper abdominal pain. EXAM: ULTRASOUND ABDOMEN LIMITED RIGHT UPPER QUADRANT COMPARISON:  MRI 04/30/2017 FINDINGS: Gallbladder: Layering sludge without stones. No gallbladder wall thickening. Sonographer reports no sonographic Murphy sign. Common bile duct: Diameter: 8-9 mm Liver: Heterogeneous echotexture with nodular contour, findings compatible with cirrhosis. Portal vein is patent on  color Doppler imaging with normal direction of blood flow towards the liver. IMPRESSION: 1. No evidence for cholelithiasis. 2. Extrahepatic biliary duct dilatation without identifiable obstructing etiology. Correlation with liver function test recommended. MRI/MRCP may prove helpful to further evaluate. Electronically Signed   By: Kennith Center M.D.   On: 03/10/2018 01:56       Today   Subjective:   Susan [name] feeling better denies any complaints  Objective:   Blood pressure 116/64, pulse 78, temperature 98.4 F (36.9 C), temperature source Oral, resp. rate 20, height 5\' 3"  (1.6 m), weight 74.3 kg, SpO2 99 %.  .  Intake/Output Summary (Last 24 hours) at 04/06/2018 1159 Last data filed at 04/06/2018 1143 Gross per 24 hour  Intake -  Output 3150 ml  Net -3150 ml    Exam VITAL SIGNS: Blood pressure 116/64, pulse 78, temperature 98.4 F (36.9 C), temperature source Oral, resp. rate 20, height 5\' 3"  (1.6 m), weight 74.3 kg, SpO2 99 %.  GENERAL:  47 y.o.-year-old patient lying in the bed with no acute distress.  EYES: Pupils equal, round, reactive to light and accommodation. No scleral icterus. Extraocular muscles intact.  HEENT: Head atraumatic, normocephalic. Oropharynx and nasopharynx clear.  NECK:  Supple, no jugular venous distention. No  thyroid enlargement, no tenderness.  LUNGS: Normal breath sounds bilaterally, no wheezing, rales,rhonchi or crepitation. No use of accessory muscles of respiration.  CARDIOVASCULAR: S1, S2 normal. No murmurs, rubs, or gallops.  ABDOMEN: Soft, nontender, nondistended. Bowel sounds present. No organomegaly or mass.  EXTREMITIES: No pedal edema, cyanosis, or clubbing.  NEUROLOGIC: Cranial nerves II through XII are intact. Muscle strength 5/5 in all extremities. Sensation intact. Gait not checked.  PSYCHIATRIC: The patient is alert and oriented x 3.  SKIN: No obvious rash, lesion, or ulcer.   Data Review     CBC w Diff:  Lab Results  Component Value Date   WBC 5.1 04/02/2018   HGB 12.4 04/02/2018   HGB 14.4 05/10/2013   HCT 36.3 04/02/2018   HCT 40.4 05/10/2013   PLT 78 (L) 04/02/2018   PLT 152 05/10/2013   LYMPHOPCT 46 02/27/2017   LYMPHOPCT 20.1 03/24/2013   MONOPCT 8 02/27/2017   MONOPCT 6.3 03/24/2013   EOSPCT 3 02/27/2017   EOSPCT 0.7 03/24/2013   BASOPCT 1 02/27/2017   BASOPCT 0.5 03/24/2013   CMP:  Lab Results  Component Value Date   NA 137 04/05/2018   NA 137 05/10/2013   K 3.6 04/05/2018   K 3.4 (L) 05/10/2013   CL 109 04/05/2018   CL 103 05/10/2013   CO2 25 04/05/2018   CO2 21 05/10/2013   BUN <5 (L) 04/05/2018   BUN 6 (L) 05/10/2013   CREATININE 0.32 (L) 04/05/2018   CREATININE 0.63 05/10/2013   PROT 8.5 (H) 04/02/2018   PROT 7.4 05/10/2013   ALBUMIN 2.8 (L) 04/02/2018   ALBUMIN 3.5 05/10/2013   BILITOT 2.3 (H) 04/02/2018   BILITOT 0.5 05/10/2013   ALKPHOS 170 (H) 04/02/2018   ALKPHOS 99 05/10/2013   AST 125 (H) 04/02/2018   AST 31 05/10/2013   ALT 32 04/02/2018   ALT 26 05/10/2013  .  Micro Results Recent Results (from the past 240 hour(s))  Gastrointestinal Panel by PCR , Stool     Status: Abnormal   Collection Time: 04/02/18  2:32 PM  Result Value Ref Range Status   Campylobacter species NOT DETECTED NOT DETECTED Final   Plesimonas  shigelloides NOT DETECTED NOT DETECTED Final  Salmonella species NOT DETECTED NOT DETECTED Final   Yersinia enterocolitica NOT DETECTED NOT DETECTED Final   Vibrio species NOT DETECTED NOT DETECTED Final   Vibrio cholerae NOT DETECTED NOT DETECTED Final   Enteroaggregative E coli (EAEC) NOT DETECTED NOT DETECTED Final   Enteropathogenic E coli (EPEC) NOT DETECTED NOT DETECTED Final   Enterotoxigenic E coli (ETEC) NOT DETECTED NOT DETECTED Final   Shiga like toxin producing E coli (STEC) NOT DETECTED NOT DETECTED Final   Shigella/Enteroinvasive E coli (EIEC) NOT DETECTED NOT DETECTED Final   Cryptosporidium NOT DETECTED NOT DETECTED Final   Cyclospora cayetanensis NOT DETECTED NOT DETECTED Final   Entamoeba histolytica NOT DETECTED NOT DETECTED Final   Giardia lamblia DETECTED (A) NOT DETECTED Final   Adenovirus F40/41 NOT DETECTED NOT DETECTED Final   Astrovirus NOT DETECTED NOT DETECTED Final   Norovirus GI/GII NOT DETECTED NOT DETECTED Final   Rotavirus A NOT DETECTED NOT DETECTED Final   Sapovirus (I, II, IV, and V) NOT DETECTED NOT DETECTED Final    Comment: Performed at [facility], [address] [place], [place] [address]  C difficile quick scan w PCR reflex     Status: None   Collection Time: 04/02/18  2:32 PM  Result Value Ref Range Status   C Diff antigen NEGATIVE NEGATIVE Final   C Diff toxin NEGATIVE NEGATIVE Final   C Diff interpretation No C. difficile detected.  Final    Comment: Performed at [facility], [address] [place], [place] [address]        Code Status Orders  (From admission, onward)         Start     Ordered   04/02/18 1516  Full code  Continuous     04/02/18 1515        Code Status History    Date Active Date Inactive Code Status Order ID Comments User Context   03/10/2018 0338 03/15/2018 1456 Full Code [id number]  Barbaraann RondoSridharan, Prasanna, MD ED   04/28/2017 2014 05/04/2017 1953 Full Code [id number]  Ramonita LabGouru, Aruna, MD Inpatient    04/02/2015 1410 04/07/2015 1739 Full Code [id number]  Adrian SaranMody, Sital, MD Inpatient   12/09/2014 1713 12/12/2014 1706 Full Code [id number]  Altamese DillingVachhani, Vaibhavkumar, MD Inpatient          Follow-up Information    Sherron Mondayejan-Sie, S Ahmed, MD. Go on 04/10/2018.   Specialty:  Internal Medicine Why:  Wednesday September 4th at 1:45pm for a follow-up appointment  Contact information: [address] [place] [address] [phone]           Discharge Medications   Allergies as of 04/06/2018      Reactions   Doxycycline Hives   Latex Hives   Promethazine Diarrhea   Ranitidine Nausea Only   Zofran [ondansetron Hcl] Itching      Medication List    STOP taking these medications   metoCLOPramide 10 MG tablet Commonly known as:  REGLAN   pantoprazole 40 MG tablet Commonly known as:  PROTONIX     TAKE these medications   albuterol 108 (90 Base) MCG/ACT inhaler Commonly known as:  PROVENTIL HFA;VENTOLIN HFA Inhale 1 puff into the lungs every 4 (four) hours as needed for wheezing or shortness of breath.   aluminum-magnesium hydroxide-simethicone 200-200-20 MG/5ML Susp Commonly known as:  MAALOX Take 30 mLs 4 (four) times daily -  before meals and at bedtime by mouth.   budesonide-formoterol 80-4.5 MCG/ACT inhaler Commonly known as:  SYMBICORT Inhale 2 puffs into  the lungs 2 (two) times daily.   chlordiazePOXIDE 5 MG capsule Commonly known as:  LIBRIUM Take 1 capsule (5 mg total) by mouth 3 (three) times daily as needed for anxiety.   clonazePAM 1 MG tablet Commonly known as:  KLONOPIN Take 1 tablet (1 mg total) by mouth 2 (two) times daily.   escitalopram 20 MG tablet Commonly known as:  LEXAPRO Take 1 tablet (20 mg total) by mouth at bedtime.   furosemide 40 MG tablet Commonly known as:  LASIX Take 1 tablet (40 mg total) by mouth 2 (two) times daily.   hydrOXYzine 25 MG tablet Commonly known as:  ATARAX/VISTARIL Take 1 tablet (25 mg total) by mouth 3 (three) times daily  as needed (itching).   ipratropium-albuterol 0.5-2.5 (3) MG/3ML Soln Commonly known as:  DUONEB Take 3 mLs by nebulization every 6 (six) hours as needed (for shortness of breath/wheezing).   loratadine 10 MG tablet Commonly known as:  CLARITIN Take 1 tablet (10 mg total) by mouth daily.   metroNIDAZOLE 500 MG tablet Commonly known as:  FLAGYL Take 1 tablet (500 mg total) by mouth 2 (two) times daily for 4 days.   oxyCODONE 5 MG immediate release tablet Commonly known as:  Oxy IR/ROXICODONE Take 1 tablet (5 mg total) by mouth every 12 (twelve) hours as needed for up to 10 days for severe pain (ha).   Potassium Chloride ER 20 MEQ Tbcr Take 40 mEq by mouth daily.   rifaximin 550 MG Tabs tablet Commonly known as:  XIFAXAN Take 1 tablet (550 mg total) by mouth 2 (two) times daily.   spironolactone 100 MG tablet Commonly known as:  ALDACTONE Take 1 tablet (100 mg total) by mouth daily.   traMADol 50 MG tablet Commonly known as:  ULTRAM Take 1 tablet (50 mg total) by mouth every 6 (six) hours as needed. What changed:    when to take this  reasons to take this          Total Time in preparing paper work, data evaluation and todays exam - 35 minutes  Auburn Bilberry M.D on 04/06/2018 at 11:59 AM [facility]   Office  [phone]

## 2018-05-14 ENCOUNTER — Other Ambulatory Visit: Payer: Self-pay

## 2018-05-14 ENCOUNTER — Encounter: Payer: Self-pay | Admitting: Gastroenterology

## 2018-05-14 ENCOUNTER — Ambulatory Visit: Payer: Medicaid Other | Admitting: Gastroenterology

## 2018-05-14 VITALS — BP 102/69 | HR 74 | Resp 17 | Wt 168.8 lb

## 2018-05-14 DIAGNOSIS — B182 Chronic viral hepatitis C: Secondary | ICD-10-CM | POA: Diagnosis not present

## 2018-05-14 DIAGNOSIS — K625 Hemorrhage of anus and rectum: Secondary | ICD-10-CM | POA: Diagnosis not present

## 2018-05-14 DIAGNOSIS — Z23 Encounter for immunization: Secondary | ICD-10-CM | POA: Diagnosis not present

## 2018-05-14 DIAGNOSIS — K921 Melena: Secondary | ICD-10-CM

## 2018-05-14 DIAGNOSIS — K7030 Alcoholic cirrhosis of liver without ascites: Secondary | ICD-10-CM

## 2018-05-14 MED ORDER — CHLORDIAZEPOXIDE HCL 5 MG PO CAPS: 5.0000 mg | ORAL_CAPSULE | Freq: Three times a day (TID) | ORAL | 0 refills | Status: AC | PRN

## 2018-05-14 NOTE — Progress Notes (Signed)
102

## 2018-05-14 NOTE — Patient Instructions (Signed)
Low-Sodium Eating Plan Sodium, which is an element that makes up salt, helps you maintain a healthy balance of fluids in your body. Too much sodium can increase your blood pressure and cause fluid and waste to be held in your body. Your health care provider or dietitian may recommend following this plan if you have high blood pressure (hypertension), kidney disease, liver disease, or heart failure. Eating less sodium can help lower your blood pressure, reduce swelling, and protect your heart, liver, and kidneys. What are tips for following this plan? General guidelines  Most people on this plan should limit their sodium intake to 1,500-2,000 mg (milligrams) of sodium each day. Reading food labels  The Nutrition Facts label lists the amount of sodium in one serving of the food. If you eat more than one serving, you must multiply the listed amount of sodium by the number of servings.  Choose foods with less than 140 mg of sodium per serving.  Avoid foods with 300 mg of sodium or more per serving. Shopping  Look for lower-sodium products, often labeled as "low-sodium" or "no salt added."  Always check the sodium content even if foods are labeled as "unsalted" or "no salt added".  Buy fresh foods. ? Avoid canned foods and premade or frozen meals. ? Avoid canned, cured, or processed meats  Buy breads that have less than 80 mg of sodium per slice. Cooking  Eat more home-cooked food and less restaurant, buffet, and fast food.  Avoid adding salt when cooking. Use salt-free seasonings or herbs instead of table salt or sea salt. Check with your health care provider or pharmacist before using salt substitutes.  Cook with plant-based oils, such as canola, sunflower, or olive oil. Meal planning  When eating at a restaurant, ask that your food be prepared with less salt or no salt, if possible.  Avoid foods that contain MSG (monosodium glutamate). MSG is sometimes added to Chinese food,  bouillon, and some canned foods. What foods are recommended? The items listed may not be a complete list. Talk with your dietitian about what dietary choices are best for you. Grains Low-sodium cereals, including oats, puffed wheat and rice, and shredded wheat. Low-sodium crackers. Unsalted rice. Unsalted pasta. Low-sodium bread. Whole-grain breads and whole-grain pasta. Vegetables Fresh or frozen vegetables. "No salt added" canned vegetables. "No salt added" tomato sauce and paste. Low-sodium or reduced-sodium tomato and vegetable juice. Fruits Fresh, frozen, or canned fruit. Fruit juice. Meats and other protein foods Fresh or frozen (no salt added) meat, poultry, seafood, and fish. Low-sodium canned tuna and salmon. Unsalted nuts. Dried peas, beans, and lentils without added salt. Unsalted canned beans. Eggs. Unsalted nut butters. Dairy Milk. Soy milk. Cheese that is naturally low in sodium, such as ricotta cheese, fresh mozzarella, or Swiss cheese Low-sodium or reduced-sodium cheese. Cream cheese. Yogurt. Fats and oils Unsalted butter. Unsalted margarine with no trans fat. Vegetable oils such as canola or olive oils. Seasonings and other foods Fresh and dried herbs and spices. Salt-free seasonings. Low-sodium mustard and ketchup. Sodium-free salad dressing. Sodium-free light mayonnaise. Fresh or refrigerated horseradish. Lemon juice. Vinegar. Homemade, reduced-sodium, or low-sodium soups. Unsalted popcorn and pretzels. Low-salt or salt-free chips. What foods are not recommended? The items listed may not be a complete list. Talk with your dietitian about what dietary choices are best for you. Grains Instant hot cereals. Bread stuffing, pancake, and biscuit mixes. Croutons. Seasoned rice or pasta mixes. Noodle soup cups. Boxed or frozen macaroni and cheese. Regular salted crackers.   Self-rising flour. Vegetables Sauerkraut, pickled vegetables, and relishes. Olives. [place] fries. Onion rings.  Regular canned vegetables (not low-sodium or reduced-sodium). Regular canned tomato sauce and paste (not low-sodium or reduced-sodium). Regular tomato and vegetable juice (not low-sodium or reduced-sodium). Frozen vegetables in sauces. Meats and other protein foods Meat or fish that is salted, canned, smoked, spiced, or pickled. Bacon, ham, sausage, hotdogs, corned beef, chipped beef, packaged lunch meats, salt pork, jerky, pickled herring, anchovies, regular canned tuna, sardines, salted nuts. Dairy Processed cheese and cheese spreads. Cheese curds. Blue cheese. Feta cheese. String cheese. Regular cottage cheese. Buttermilk. Canned milk. Fats and oils Salted butter. Regular margarine. Ghee. Bacon fat. Seasonings and other foods Onion salt, garlic salt, seasoned salt, table salt, and sea salt. Canned and packaged gravies. Worcestershire sauce. Tartar sauce. Barbecue sauce. Teriyaki sauce. Soy sauce, including reduced-sodium. Steak sauce. Fish sauce. Oyster sauce. Cocktail sauce. Horseradish that you find on the shelf. Regular ketchup and mustard. Meat flavorings and tenderizers. Bouillon cubes. Hot sauce and Tabasco sauce. Premade or packaged marinades. Premade or packaged taco seasonings. Relishes. Regular salad dressings. Salsa. Potato and tortilla chips. Corn chips and puffs. Salted popcorn and pretzels. Canned or dried soups. Pizza. Frozen entrees and pot pies. Summary  Eating less sodium can help lower your blood pressure, reduce swelling, and protect your heart, liver, and kidneys.  Most people on this plan should limit their sodium intake to 1,500-2,000 mg (milligrams) of sodium each day.  Canned, boxed, and frozen foods are high in sodium. Restaurant foods, fast foods, and pizza are also very high in sodium. You also get sodium by adding salt to food.  Try to cook at home, eat more fresh fruits and vegetables, and eat less fast food, canned, processed, or prepared foods. This information is  not intended to replace advice given to you by your health care provider. Make sure you discuss any questions you have with your health care provider. Document Released: 01/13/2002 Document Revised: 07/17/2016 Document Reviewed: 07/17/2016 Elsevier Interactive Patient Education  2018 [employer] High-Protein and High-Calorie Diet Eating high-protein and high-calorie foods can help you to gain weight, heal after an injury, and recover after an illness or surgery. What is my plan? The specific amount of daily protein and calories you need depends on:  Your body weight.  The reason this diet is recommended for you.  Generally, a high-protein, high-calorie diet involves:  Eating 250-500 extra calories each day.  Making sure that 10-35% of your daily calories come from protein.  Talk to your health care provider about how much protein and how many calories you need each day. Follow the diet as directed by your health care provider. What do I need to know about this diet?  Ask your health care provider if you should take a nutritional supplement.  Try to eat six small meals each day instead of three large meals.  Eat a balanced diet, including one food that is high in protein at each meal.  Keep nutritious snacks handy, such as nuts, trail mixes, dried fruit, and yogurt.  If you have kidney disease or diabetes, eating too much protein may put extra stress on your kidneys. Talk to your health care provider if you have either of those conditions. What are some high-protein foods? Grains Quinoa. Bulgur wheat. Vegetables Soybeans. Peas. Meats and Other Protein Sources Beef, pork, and poultry. Fish and seafood. Eggs. Tofu. Textured vegetable protein (TVP). Peanut butter. Nuts and seeds. Dried beans. Protein powders. Dairy Whole milk. Whole-milk  yogurt. Powdered milk. Cheese. [employer]. Eggnog. Beverages High-protein supplement drinks. Soy milk. Other Protein bars. The items  listed above may not be a complete list of recommended foods or beverages. Contact your dietitian for more options. What are some high-calorie foods? Grains Pasta. Quick breads. Muffins. Pancakes. Ready-to-eat cereal. Vegetables Vegetables cooked in oil or butter. Fried potatoes. Fruits Dried fruit. Fruit leather. Canned fruit in syrup. Fruit juice. Avocados. Meats and Other Protein Sources Peanut butter. Nuts and seeds. Dairy Heavy cream. Whipped cream. Cream cheese. Sour cream. Ice cream. Custard. Pudding. Beverages Meal-replacement beverages. Nutrition shakes. Fruit juice. Sugar-sweetened soft drinks. Condiments Salad dressing. Mayonnaise. Alfredo sauce. Fruit preserves or jelly. Honey. Syrup. Sweets/Desserts Cake. Cookies. Pie. Pastries. Candy bars. Chocolate. Fats and Oils Butter or margarine. Oil. Gravy. Other Meal-replacement bars. The items listed above may not be a complete list of recommended foods or beverages. Contact your dietitian for more options. What are some tips for including high-protein and high-calorie foods in my diet?  Add whole milk, half-and-half, or heavy cream to cereal, pudding, soup, or hot cocoa.  Add whole milk to instant breakfast drinks.  Add peanut butter to oatmeal or smoothies.  Add powdered milk to baked goods, smoothies, or milkshakes.  Add powdered milk, cream, or butter to mashed potatoes.  Add cheese to cooked vegetables.  Make whole-milk yogurt parfaits. Top them with granola, fruit, or nuts.  Add cottage cheese to your fruit.  Add avocados, cheese, or both to sandwiches or salads.  Add meat, poultry, or seafood to rice, pasta, casseroles, salads, and soups.  Use mayonnaise when making egg salad, chicken salad, or tuna salad.  Use peanut butter as a topping for pretzels, celery, or crackers.  Add beans to casseroles, dips, and spreads.  Add pureed beans to sauces and soups.  Replace calorie-free drinks with  calorie-containing drinks, such as milk and fruit juice. This information is not intended to replace advice given to you by your health care provider. Make sure you discuss any questions you have with your health care provider. Document Released: 07/24/2005 Document Revised: 12/30/2015 Document Reviewed: 01/06/2014 Elsevier Interactive Patient Education  2018 [employer]. Protein Content in Foods Generally, most healthy people need around 50 grams of protein each day. Depending on your overall health, you may need more or less protein in your diet. Talk to your health care provider or dietitian about how much protein you need. See the following list for the protein content of some common foods. High-protein foods High-protein foods contain 4 grams (4 g) or more of protein per serving. They include:  Beef, ground sirloin (cooked) - 3 oz have 24 g of protein.  Cheese (hard) - 1 oz has 7 g of protein.  Chicken breast, boneless and skinless (cooked) - 3 oz have 13.4 g of protein.  Cottage cheese - 1/2 cup has 13.4 g of protein.  Egg - 1 egg has 6 g of protein.  Fish, filet (cooked) - 1 oz has 6-7 g of protein.  Garbanzo beans (canned or cooked) - 1/2 cup has 6-7 g of protein.  Kidney beans (canned or cooked) - 1/2 cup has 6-7 g of protein.  Lamb (cooked) - 3 oz has 24 g of protein.  Milk - 1 cup (8 oz) has 8 g of protein.  Nuts (peanuts, pistachios, almonds) - 1 oz has 6 g of protein.  Peanut butter - 1 oz has 7-8 g of protein.  Pork tenderloin (cooked) - 3 oz has 18.4 g  of protein.  Pumpkin seeds - 1 oz has 8.5 g of protein.  Soybeans (roasted) - 1 oz has 8 g of protein.  Soybeans (cooked) - 1/2 cup has 11 g of protein.  Soy milk - 1 cup (8 oz) has 5-10 g of protein.  Soy or vegetable patty - 1 patty has 11 g of protein.  Sunflower seeds - 1 oz has 5.5 g of protein.  Tofu (firm) - 1/2 cup has 20 g of protein.  Tuna (canned in water) - 3 oz has 20 g of  protein.  Yogurt - 6 oz has 8 g of protein.  Low-protein foods Low-protein foods contain 3 grams (3 g) or less of protein per serving. They include:  Beets (raw or cooked) - 1/2 cup has 1.5 g of protein.  Bran cereal - 1/2 cup has 2-3 g of protein.  Bread - 1 slice has 2.5 g of protein.  Broccoli (raw or cooked) - 1/2 cup has 2 g of protein.  Collard greens (raw or cooked) - 1/2 cup has 2 g of protein.  Corn (fresh or cooked) - 1/2 cup has 2 g of protein.  Cream cheese - 1 oz has 2 g of protein.  Creamer (half-and-half) - 1 oz has 1 g of protein.  Flour tortilla - 1 tortilla has 2.5 g of protein  Frozen yogurt - 1/2 cup has 3 g of protein.  Fruit or vegetable juice - 1/2 cup has 1 g of protein.  Green beans (raw or cooked) - 1/2 cup has 1 g of protein.  Green peas (canned) - 1/2 cup has 3.5 g of protein.  Muffins - 1 small muffin (2 oz) has 3 g of protein.  Oatmeal (cooked) - 1/2 cup has 3 g of protein.  Potato (baked with skin) - 1 medium potato has 3 g of protein.  Rice (cooked) - 1/2 cup has 2.5-3.5 g of protein.  Sour cream - 1/2 cup has 2.5 g of protein.  Spinach (cooked) - 1/2 cup has 3 g of protein.  Squash (cooked) - 1/2 cup has 1.5 g of protein.  Actual amounts of protein may be different depending on processing. Talk with your health care provider or dietitian about what foods are recommended for you. This information is not intended to replace advice given to you by your health care provider. Make sure you discuss any questions you have with your health care provider. Document Released: 10/23/2015 Document Revised: 04/03/2016 Document Reviewed: 04/03/2016 Elsevier Interactive Patient Education  2018 [employer]. Cirrhosis Cirrhosis is long-term (chronic) liver injury. The liver is your largest internal organ, and it performs many functions. The liver converts food into energy, removes toxic material from your blood, makes important proteins, and  absorbs necessary vitamins from your diet. If you have cirrhosis, it means many of your healthy liver cells have been replaced by scar tissue. This prevents blood from flowing through your liver, which makes it difficult for your liver to function. This scarring is not reversible, but treatment can prevent it from getting worse. What are the causes? Hepatitis C and long-term alcohol abuse are the most common causes of cirrhosis. Other causes include:  Nonalcoholic fatty liver disease.  Hepatitis B infection.  Autoimmune hepatitis.  Diseases that cause blockage of ducts inside the liver.  Inherited liver diseases.  Reactions to certain long-term medicines.  Parasitic infections.  Long-term exposure to certain toxins.  What increases the risk? You may have a higher risk of cirrhosis  if you:  Have certain hepatitis viruses.  Abuse alcohol, especially if you are female.  Are overweight.  Share needles.  Have unprotected sex with someone who has hepatitis.  What are the signs or symptoms? You may not have any signs and symptoms at first. Symptoms may not develop until the damage to your liver starts to get worse. Signs and symptoms of cirrhosis may include:  Tenderness in the right-upper part of your abdomen.  Weakness and tiredness (fatigue).  Loss of appetite.  Nausea.  Weight loss and muscle loss.  Itchiness.  Yellow skin and eyes (jaundice).  Buildup of fluid in the abdomen (ascites).  Swelling of the feet and ankles (edema).  Appearance of tiny blood vessels under the skin.  Mental confusion.  Easy bruising and bleeding.  How is this diagnosed? Your health care provider may suspect cirrhosis based on your symptoms and medical history, especially if you have other medical conditions or a history of alcohol abuse. Your health care provider will do a physical exam to feel your liver and check for signs of cirrhosis. Your health care provider may perform  other tests, including:  Blood tests to check: ? Whether you have hepatitis B or C. ? Kidney function. ? Liver function.  Imaging tests such as: ? MRI or CT scan to look for changes seen in advanced cirrhosis. ? Ultrasound to see if normal liver tissue is being replaced by scar tissue.  A procedure using a long needle to take a sample of liver tissue (biopsy) for examination under a microscope. Liver biopsy can confirm the diagnosis of cirrhosis.  How is this treated? Treatment depends on how damaged your liver is and what caused the damage. Treatment may include treating cirrhosis symptoms or treating the underlying causes of the condition to try to slow the progression of the damage. Treatment may include:  Making lifestyle changes, such as: ? Eating a healthy diet. ? Restricting salt intake. ? Maintaining a healthy weight. ? Not abusing drugs or alcohol.  Taking medicines to: ? Treat liver infections or other infections. ? Control itching. ? Reduce fluid buildup. ? Reduce certain blood toxins. ? Reduce risk of bleeding from enlarged blood vessels in the stomach or esophagus (varices).  If varices are causing bleeding problems, you may need treatment with a procedure that ties up the vessels causing them to fall off (band ligation).  If cirrhosis is causing your liver to fail, your health care provider may recommend a liver transplant.  Other treatments may be recommended depending on any complications of cirrhosis, such as liver-related kidney failure (hepatorenal syndrome).  Follow these instructions at home:  Take medicines only as directed by your health care provider. Do not use drugs that are toxic to your liver. Ask your health care provider before taking any new medicines, including over-the-counter medicines.  Rest as needed.  Eat a well-balanced diet. Ask your health care provider or dietitian for more information.  You may have to follow a low-salt diet or  restrict your water intake as directed.  Do not drink alcohol. This is especially important if you are taking acetaminophen.  Keep all follow-up visits as directed by your health care provider. This is important. Contact a health care provider if:  You have fatigue or weakness that is getting worse.  You develop swelling of the hands, feet, legs, or face.  You have a fever.  You develop loss of appetite.  You have nausea or vomiting.  You develop jaundice.  You develop easy bruising or bleeding. Get help right away if:  You vomit bright red blood or a material that looks like coffee grounds.  You have blood in your stools.  Your stools appear black and tarry.  You become confused.  You have chest pain or trouble breathing. This information is not intended to replace advice given to you by your health care provider. Make sure you discuss any questions you have with your health care provider. Document Released: 07/24/2005 Document Revised: 12/02/2015 Document Reviewed: 04/01/2014 Elsevier Interactive Patient Education  2018 [employer]. Alcoholic Hepatitis Alcoholic hepatitis is liver inflammation caused by drinking alcohol. This inflammation decreases the liver's ability to function normally. What are the causes? Alcoholic hepatitis is caused by heavy drinking. What increases the risk? You may have an increased risk of alcoholic hepatitis if:  You drink large amounts of alcohol.  You have been drinking heavily for years.  You binge drink.  You are female.  You are obese.  You have had infectious hepatitis.  You are malnourished.  You have close family members who have had alcoholic hepatitis.  What are the signs or symptoms?  Abdominal pain.  A swollen abdomen.  Loss of appetite.  Unintentional weight loss.  Nausea and vomiting.  Tiredness.  Dry mouth.  Severe thirst.  A yellow tone to the skin and whites of the eyes (jaundice).  Spidery  veins, especially across the skin of the abdomen.  Unusual bleeding.  Itching.  Trouble thinking clearly.  Memory problems.  Mood changes.  Confusion.  Numbness and tingling in the feet and legs. How is this diagnosed? Alcoholic hepatitis is diagnosed with blood tests that show problems with liver function. Additional tests may also be done, such as:  An ultrasound.  A CT scan.  An MRI scan.  A liver biopsy. For this test, a small sample of the liver will be taken and examined for evidence of liver damage.  How is this treated? The most important step you can take to treat alcoholic hepatitis is to stop drinking alcohol. If you are addicted to alcohol, your health care provider will help you create a plan to quit. It may involve:  Taking medicine to decrease withdrawal symptoms.  Entering a program to help you stop drinking.  Joining a support group.  Additional treatment for alcoholic hepatitis may include:  Medicines such as steroids. The medicines will help decrease the inflammation.  Diet. Your health care provider might ask you to undergo nutritional counseling and follow a diet. You may also need to take dietary supplements and vitamins.  A liver transplant. This procedure is performed in very severe cases. It is only performed on people who have totally stopped drinking and can commit to never drinking alcohol again.  Follow these instructions at home:  Do not drink alcohol.  Do not use medicines or eat foods that contain alcohol.  Take medicines only as directed by your health care provider.  Follow dietary instructions carefully.  Keep all follow-up visits as directed by your health care provider. This is important. Contact a health care provider if:  You have a fever.  You do not have your usual appetite.  You have flu-like symptoms such as fatigue, weakness, or muscle aches.  You feel nauseous or vomit.  You bruise easily.  Your urine is  very dark.  You have new abdominal pain. Get help right away if:  There is blood in your vomit.  You develop jaundice.  Your skin  itches severely.  Your legs swell.  Your stomach appears bloated.  You have black, tarry-appearing stools.  You bleed easily.  You are confused or not thinking clearly.  You have a seizure. This information is not intended to replace advice given to you by your health care provider. Make sure you discuss any questions you have with your health care provider. Document Released: 02/18/2014 Document Revised: 12/30/2015 Document Reviewed: 09/17/2013 Elsevier Interactive Patient Education  [employer].

## 2018-05-14 NOTE — Progress Notes (Signed)
Susan Repress, MD [address]  [address]  [place], [place] [address]  Main: [phone]  Fax: [phone]    Gastroenterology Consultation  Referring Provider:     [name], MD Primary Care Physician:  [name], MD Primary Gastroenterologist:  Dr. Arlyss [name] Reason for Consultation:     Cirrhosis of liver, chronic hepatitis C        HPI:   Susan [name] is a 47 y.o. Susan referred by Dr. [name], MD  for consultation & management of cirrhosis of liver.  Patient has history of heavy alcohol use, chronic hep C, genotype 3, treatment nave.  She is known to have cirrhosis of liver for several years, appears to be well compensated.  She used to see GI at [facility] clinic.  She said she was released from that clinic because of several no-shows.  She has been hospitalized to [facility] a few times in the past ER secondary to alcoholic hepatitis, infectious diarrhea from E. Coli, Campylobacter and Giardia and she was treated for this infection.  She was tested negative for HIV in 04/2017  Patient reports generalized body pains, being depressed and not under control.  She continues to drink about 6 shots almost daily.  She used to drink about 20 vodkas in the past.  She lives with her boyfriend and has 28 year old and 37 year old son and daughter.  She is unemployed, accompanied by her friend today  Patient reports one episode of black stool this past Sunday.  Also, notices blood on wiping.  She denies hematemesis, hematochezia.  She denies abdominal distention but reports abdominal cramps.  She does report bruises in her upper extremities.  She denies swelling of legs.  She drinks sodas regularly.  She does smoke cigarettes.  She is also on opioid medication for chronic pain  NSAIDs: None  Antiplts/Anticoagulants/Anti thrombotics: None  GI Procedures: Reportedly had an EGD and colonoscopy several years ago, reports not available  Past Medical History:    Diagnosis Date  . Allergy   . Anemia   . Anxiety   . Asthma   . Cirrhosis (HCC)   . COPD (chronic obstructive pulmonary disease) (HCC) 12/09/2014  . Depression   . Emphysema of lung (HCC)   . GERD (gastroesophageal reflux disease)   . Heart murmur   . Hepatitis C    pt said they told her she had this last visit here  . Hyperlipidemia   . Hypertension     Past Surgical History:  Procedure Laterality Date  . BREAST CYST ASPIRATION Right 12/2017   abscess aspiration  . CESAREAN SECTION  2009    Current Outpatient Medications:  .  albuterol (PROVENTIL HFA;VENTOLIN HFA) 108 (90 Base) MCG/ACT inhaler, Inhale 1 puff into the lungs every 4 (four) hours as needed for wheezing or shortness of breath., Disp: 1 Inhaler, Rfl: 0 .  budesonide-formoterol (SYMBICORT) 80-4.5 MCG/ACT inhaler, Inhale 2 puffs into the lungs 2 (two) times daily., Disp: 1 Inhaler, Rfl: 12 .  chlordiazePOXIDE (LIBRIUM) 5 MG capsule, Take 1 capsule (5 mg total) by mouth 3 (three) times daily as needed for up to 30 doses for anxiety or withdrawal., Disp: 30 capsule, Rfl: 0 .  clonazePAM (KLONOPIN) 1 MG tablet, Take 1 tablet (1 mg total) by mouth 2 (two) times daily., Disp: 30 tablet, Rfl: 0 .  escitalopram (LEXAPRO) 20 MG tablet, Take 1 tablet (20 mg total) by mouth at bedtime., Disp: 30 tablet, Rfl: 0 .  furosemide (LASIX) 40 MG tablet, Take 1 tablet (40 mg total) by mouth 2 (two) times daily., Disp: 60 tablet, Rfl: 0 .  hydrOXYzine (ATARAX/VISTARIL) 25 MG tablet, Take 1 tablet (25 mg total) by mouth 3 (three) times daily as needed (itching)., Disp: 30 tablet, Rfl: 0 .  ipratropium-albuterol (DUONEB) 0.5-2.5 (3) MG/3ML SOLN, Take 3 mLs by nebulization every 6 (six) hours as needed (for shortness of breath/wheezing). , Disp: , Rfl:  .  loratadine (CLARITIN) 10 MG tablet, Take 1 tablet (10 mg total) by mouth daily., Disp: 30 tablet, Rfl: 0 .  Potassium Chloride ER 20 MEQ TBCR, Take 40 mEq by mouth daily., Disp: 60 tablet,  Rfl: 1 .  rifaximin (XIFAXAN) 550 MG TABS tablet, Take 1 tablet (550 mg total) by mouth 2 (two) times daily., Disp: 60 tablet, Rfl: 0 .  spironolactone (ALDACTONE) 100 MG tablet, Take 1 tablet (100 mg total) by mouth daily., Disp: 30 tablet, Rfl: 0 .  traMADol (ULTRAM) 50 MG tablet, Take 1 tablet (50 mg total) by mouth every 6 (six) hours as needed., Disp: 10 tablet, Rfl: 0 .  aluminum-magnesium hydroxide-simethicone (MAALOX) 200-200-20 MG/5ML SUSP, Take 30 mLs 4 (four) times daily -  before meals and at bedtime by mouth. (Patient not taking: Reported on 05/14/2018), Disp: 355 mL, Rfl: 0    Family History  Problem Relation Age of Onset  . Other Mother   . Lung cancer Mother   . Skin cancer Father   . Breast cancer Maternal Grandmother 64     Social History   Tobacco Use  . Smoking status: Current Every Day Smoker    Packs/day: 1.00    Years: 20.00    Pack years: 20.00    Types: Cigarettes  . Smokeless tobacco: Never Used  Substance Use Topics  . Alcohol use: No    Comment: Quit drinking few years ago.  . Drug use: No    Allergies as of 05/14/2018 - Review Complete 05/14/2018  Allergen Reaction Noted  . Doxycycline Hives 12/09/2014  . Latex Hives 12/09/2014  . Promethazine Diarrhea 12/09/2014  . Ranitidine Nausea Only 12/09/2014  . Zofran [ondansetron hcl] Itching 12/09/2014    Review of Systems:    All systems reviewed and negative except where noted in HPI.   Physical Exam:  BP 102/69 (BP Location: Left Arm, Patient Position: Sitting, Cuff Size: Large)   Pulse 74   Resp 17   Wt 168 lb 12.8 oz (76.6 kg)   BMI 29.90 kg/m  No LMP recorded. Patient has had an ablation.  General:   Alert, moderately built, moderately nourished, pleasant and cooperative in NAD Head:  Normocephalic and atraumatic. Eyes:  Sclera mild icterus.   Conjunctiva muddy. Ears:  Normal auditory acuity. Nose:  No deformity, discharge, or lesions. Mouth:  No deformity or lesions,oropharynx pink  & moist. Neck:  Supple; no masses or thyromegaly. Lungs:  Respirations even and unlabored.  Clear throughout to auscultation.   No wheezes, crackles, or rhonchi. No acute distress. Heart:  Regular rate and rhythm; no murmurs, clicks, rubs, or gallops. Abdomen:  Normal bowel sounds. Soft, non-tender and non-distended without masses, hepatosplenomegaly or hernias noted.  No guarding or rebound tenderness.   Rectal: Not performed Msk:  Symmetrical without gross deformities. Good, equal movement & strength bilaterally. Pulses:  Normal pulses noted. Extremities:  No clubbing or edema.  No cyanosis. Neurologic:  Alert and oriented x3;  grossly normal neurologically. Skin: Spider nevi on anterior and posterior chest wall, petechiae  in her bilateral forearms,No jaundice. Lymph Nodes:  No significant cervical adenopathy. Psych:  Alert and cooperative. Normal mood and affect.  Imaging Studies: Reviewed  Assessment and Plan:   ELEASE SWARM is a 47 y.o. Caucasian Susan with history of alcohol abuse, tobacco abuse, chronic hep C, genotype 3, treatment nave, compensated cirrhosis, history of depression, prior history of infection with Giardia, Campylobacter and E. Coli   Cirrhosis of liver: Well compensated, child class B Etiology-combination of chronic hep C and alcohol abuse Portal hypertension: Manifested as thrombocytopenia Recent ultrasound did not reveal evidence of ascites, no swelling of legs Recommend low-sodium diet, continue spironolactone Varices: Recommend EGD for variceal screening  HCC screening: Recent ultrasound did not reveal evidence of liver lesions, recommend repeat ultrasound in 3 months PSE: None HRS: none Counseled pt to quit drinking, join alcohol Anonymous groups.  Prescribed her Librium for short-term Follow-up with PCP for management of depression.  Recommend referral to psychologist and psychiatrist Recommend vaccination against hepatitis A and B serologies are  negative.  Administered first dose of Twinrix today Received Pneumovax in 03/2018 Received flu vaccine 05/2018 Recheck labs today  Melena and rectal bleeding: Recommend EGD and colonoscopy for further evaluation  Chronic hepatitis C: Genotype 3, treatment nave Advised patient that treatment of hepatitis C will not prevent progression of her cirrhosis if she continues to drink Will defer treatment at this time  Follow up in 1 month   Susan Repress, MD

## 2018-05-15 LAB — FOLATE: Folate: 13.3 ng/mL (ref >3.0)

## 2018-05-15 LAB — COMPREHENSIVE METABOLIC PANEL
ALK PHOS: 138 IU/L — AB (ref 39–117)
ALT: 63 IU/L — ABNORMAL HIGH (ref 0–32)
AST: 250 IU/L — AB (ref 0–40)
Albumin/Globulin Ratio: 0.7 — ABNORMAL LOW (ref 1.2–2.2)
Albumin: 3.2 g/dL — ABNORMAL LOW (ref 3.5–5.5)
BUN/Creatinine Ratio: 6 — ABNORMAL LOW (ref 9–23)
BUN: 3 mg/dL — AB (ref 6–24)
Bilirubin Total: 1.6 mg/dL — ABNORMAL HIGH (ref 0.0–1.2)
CHLORIDE: 102 mmol/L (ref 96–106)
CO2: 18 mmol/L — AB (ref 20–29)
CREATININE: 0.49 mg/dL — AB (ref 0.57–1.00)
Calcium: 8.6 mg/dL — ABNORMAL LOW (ref 8.7–10.2)
GFR calc Af Amer: 137 mL/min/{1.73_m2} (ref >59)
GFR calc non Af Amer: 119 mL/min/{1.73_m2} (ref >59)
GLOBULIN, TOTAL: 4.7 g/dL — AB (ref 1.5–4.5)
GLUCOSE: 100 mg/dL — AB (ref 65–99)
POTASSIUM: 3.4 mmol/L — AB (ref 3.5–5.2)
SODIUM: 138 mmol/L (ref 134–144)
Total Protein: 7.9 g/dL (ref 6.0–8.5)

## 2018-05-15 LAB — CBC
HEMATOCRIT: 33.2 % — AB (ref 34.0–46.6)
Hemoglobin: 11.7 g/dL (ref 11.1–15.9)
MCH: 33 pg (ref 26.6–33.0)
MCHC: 35.2 g/dL (ref 31.5–35.7)
MCV: 94 fL (ref 79–97)
Platelets: 99 10*3/uL — CL (ref 150–450)
RBC: 3.55 x10E6/uL — ABNORMAL LOW (ref 3.77–5.28)
RDW: 13.7 % (ref 12.3–15.4)
WBC: 3.7 10*3/uL (ref 3.4–10.8)

## 2018-05-15 LAB — VITAMIN B12: Vitamin B-12: 1124 pg/mL (ref 232–1245)

## 2018-05-15 LAB — PROTIME-INR
INR: 1.3 — ABNORMAL HIGH (ref 0.8–1.2)
Prothrombin Time: 13.4 s — ABNORMAL HIGH (ref 9.1–12.0)

## 2018-05-15 LAB — FERRITIN: Ferritin: 122 ng/mL (ref 15–150)

## 2018-05-17 LAB — VITAMIN B1: Thiamine: 96.6 nmol/L (ref 66.5–200.0)

## 2018-05-17 LAB — VITAMIN D 25 HYDROXY (VIT D DEFICIENCY, FRACTURES): Vit D, 25-Hydroxy: 19.3 ng/mL — ABNORMAL LOW (ref 30.0–100.0)

## 2018-05-17 LAB — MAGNESIUM: MAGNESIUM: 1.7 mg/dL (ref 1.6–2.3)

## 2018-06-04 ENCOUNTER — Ambulatory Visit: Admission: RE | Admit: 2018-06-04 | Payer: Medicaid Other | Source: Ambulatory Visit | Admitting: Gastroenterology

## 2018-06-04 ENCOUNTER — Encounter: Admission: RE | Payer: Self-pay | Source: Ambulatory Visit

## 2018-06-04 ENCOUNTER — Encounter: Payer: Self-pay | Admitting: Anesthesiology

## 2018-06-04 SURGERY — ESOPHAGOGASTRODUODENOSCOPY (EGD) WITH PROPOFOL
Anesthesia: General

## 2018-06-07 DEATH — deceased

## 2018-06-14 ENCOUNTER — Encounter: Payer: Self-pay | Admitting: Gastroenterology

## 2018-06-14 ENCOUNTER — Ambulatory Visit: Payer: Medicaid Other | Admitting: Gastroenterology

## 2018-06-14 DIAGNOSIS — K7030 Alcoholic cirrhosis of liver without ascites: Secondary | ICD-10-CM

## 2018-11-09 IMAGING — CT CT CERVICAL SPINE W/O CM
3 of 7 series · 10 of 33 positions shown, 11 images · non-contrast
Comparison: 07/07/2015

CLINICAL DATA: Fell tonight.  Scalp laceration.

EXAM:
CT HEAD WITHOUT CONTRAST
CT CERVICAL SPINE WITHOUT CONTRAST
TECHNIQUE: Multidetector CT imaging of the head and cervical spine was
performed following the standard protocol without intravenous
contrast. Multiplanar CT image reconstructions of the cervical spine
were also generated.

[Series 8: sagittal bone · sagittal · 0.21mm/px · 5 of 66 slices shown]
[im 11/66  bone]
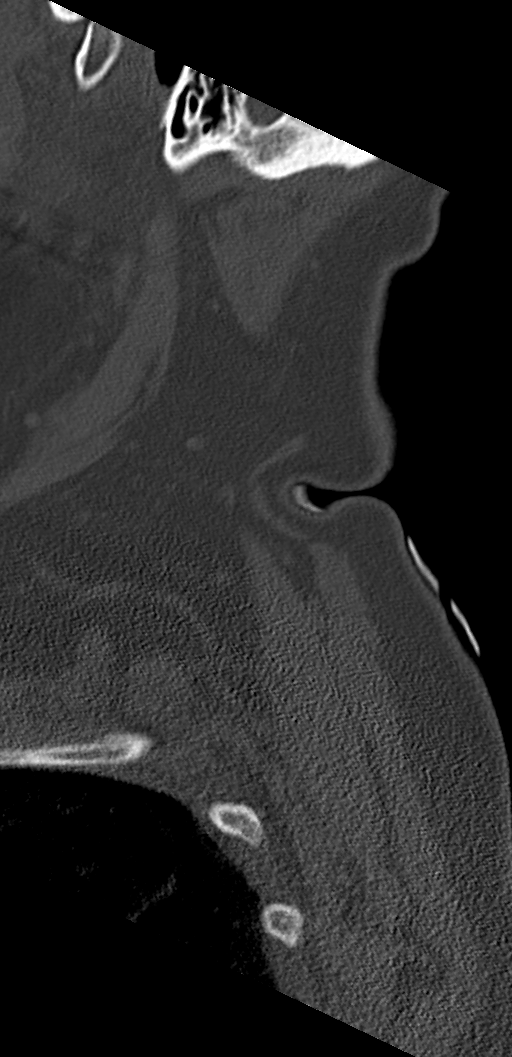
[im 22/66  bone]
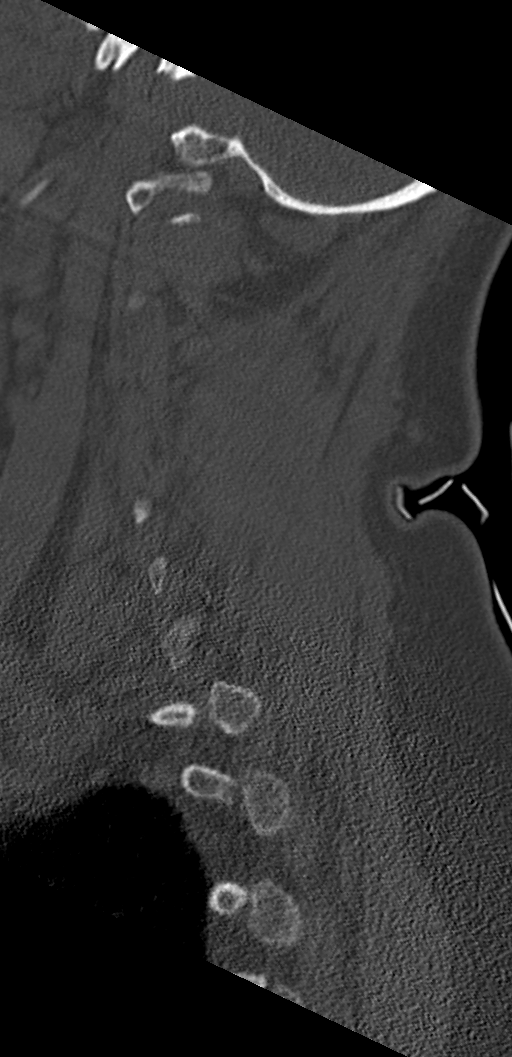
[im 33/66  bone]
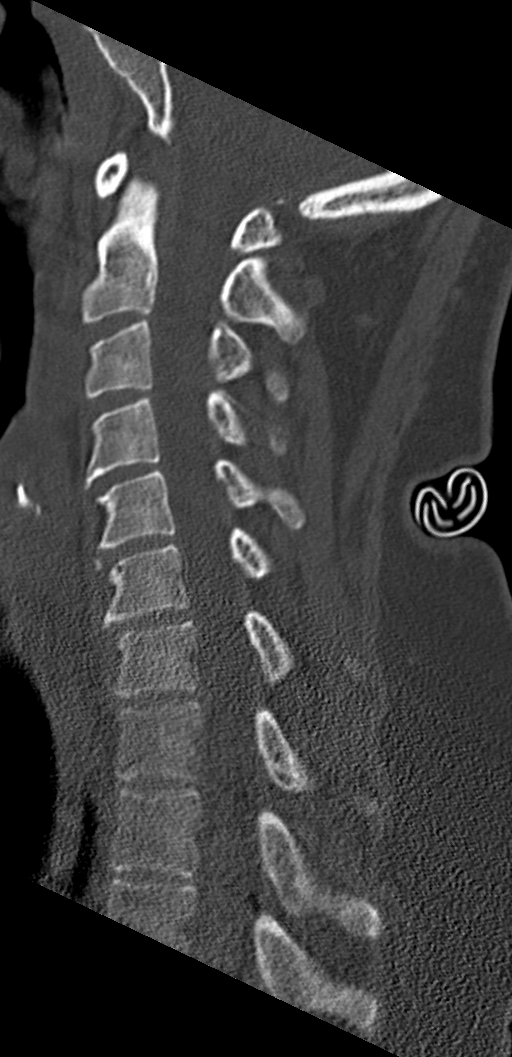
[im 44/66  bone]
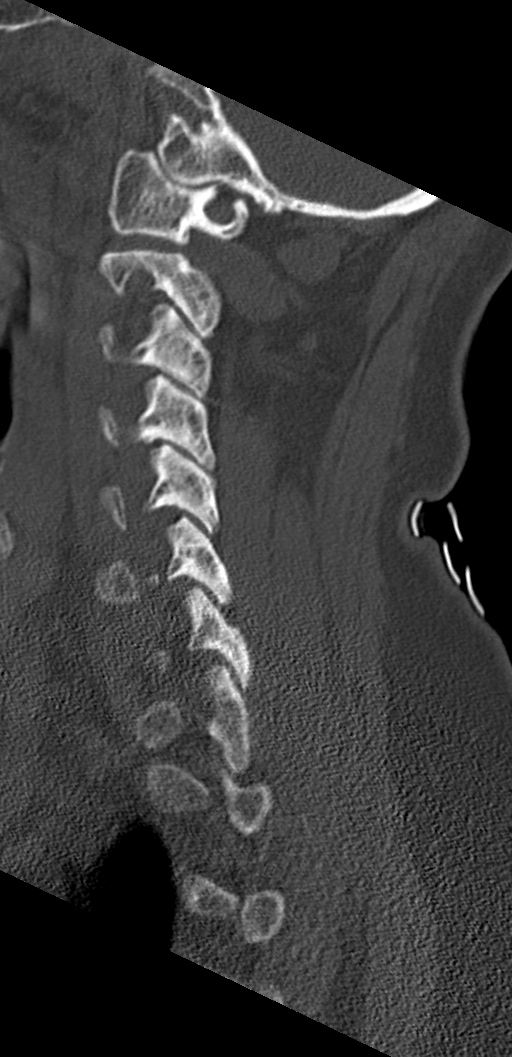
[im 55/66  bone]
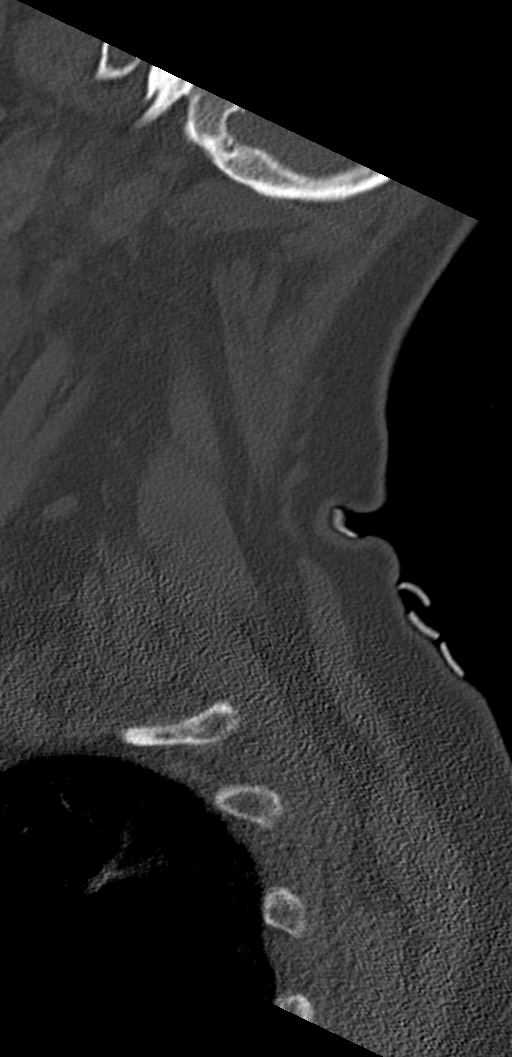

[Series 9: coronal bone · coronal · 0.26mm/px · 1 of 54 slices shown]
[im 27/54  bone]
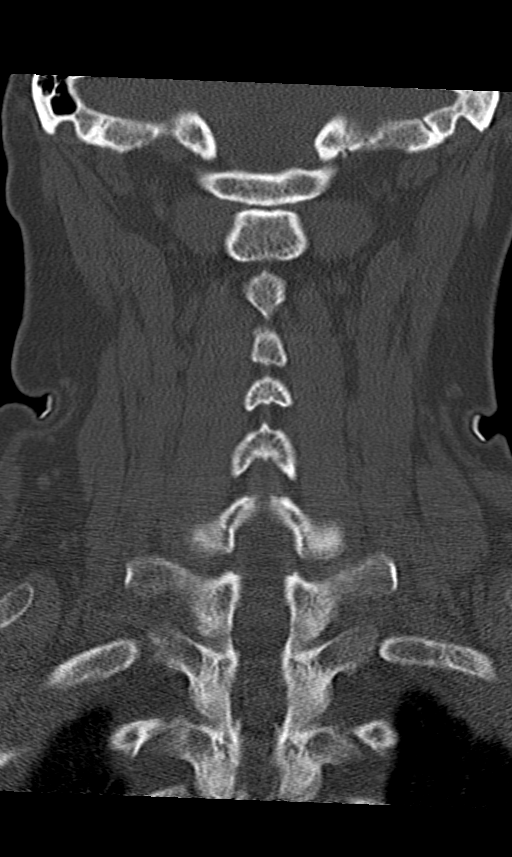

[Series 10: orthogonal bone · axial · 0.21mm/px · z∈[-313,-181]mm · 4 of 111 slices shown, 5 images]
[im 19/111  soft-tissue]
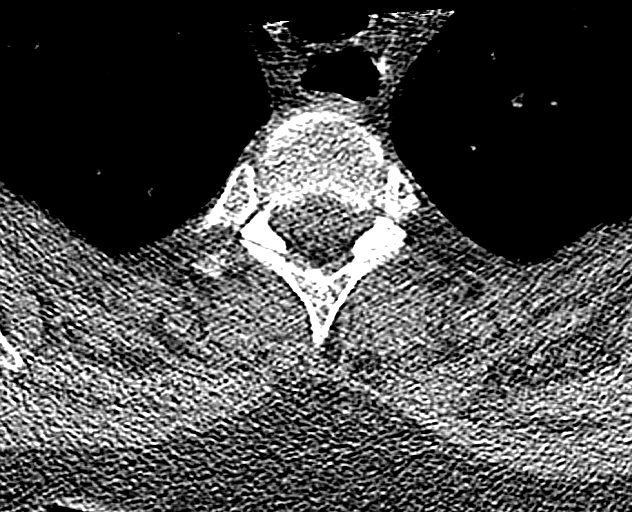
[im 19/111  bone]
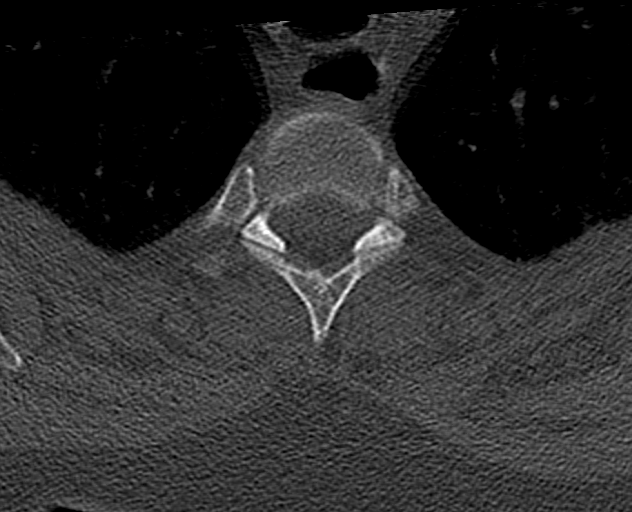
[im 37/111  bone]
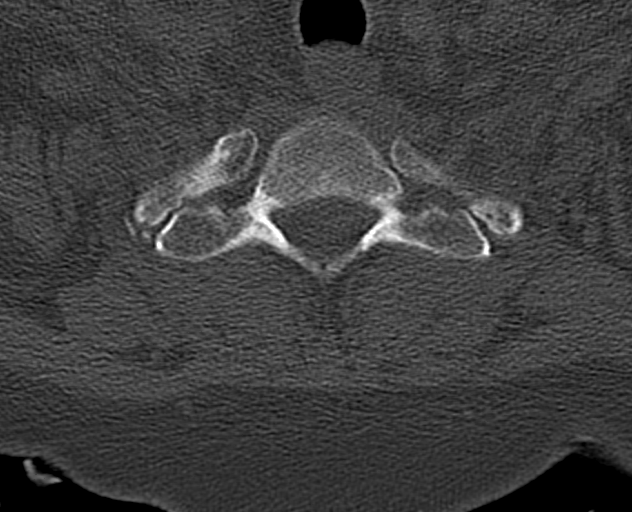
[im 74/111  bone]
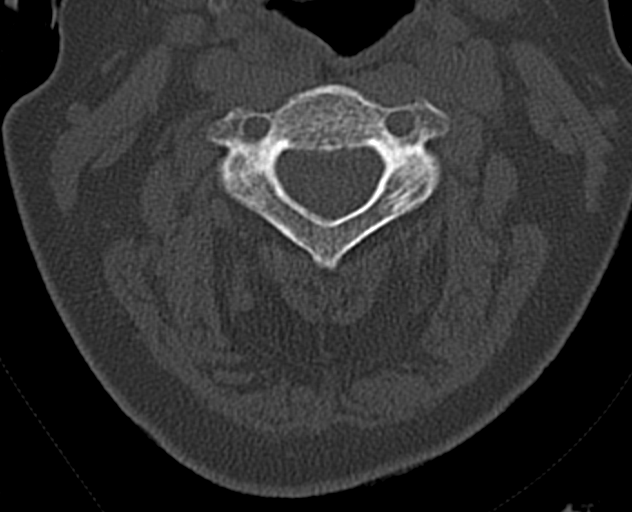
[im 92/111  bone]
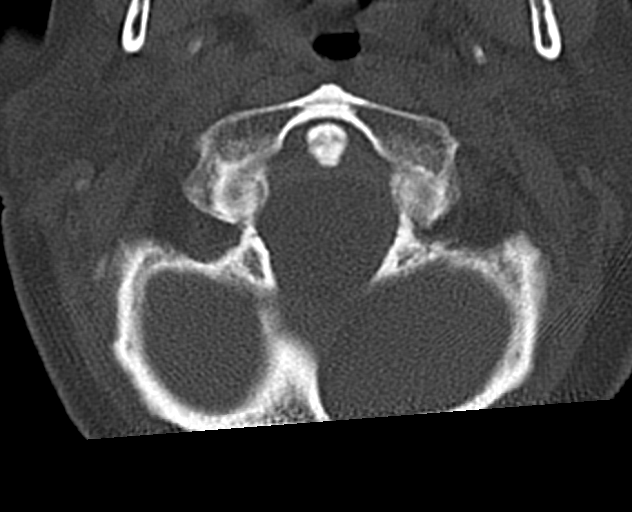

[10 of 33 positions shown; findings below may reference images not displayed]

FINDINGS: CT HEAD FINDINGS

Brain: There is no intracranial hemorrhage, mass or evidence of
acute infarction. There is no extra-axial fluid collection. Gray
matter and white matter appear normal. Cerebral volume is normal for
age. Brainstem and posterior fossa are unremarkable. The CSF spaces
appear normal.

Vascular: No hyperdense vessel or unexpected calcification.

Skull: Normal. Negative for fracture or focal lesion.

Sinuses/Orbits: No acute finding.

Other: Marked swelling and laceration of the left parietal scalp
posteriorly.

CT CERVICAL SPINE FINDINGS

Alignment: Normal.

Skull base and vertebrae: No acute fracture. No primary bone lesion
or focal pathologic process.

Soft tissues and spinal canal: No prevertebral fluid or swelling. No
visible canal hematoma.

Disc levels: Good preservation of intervertebral disc spaces. Facet
articulations are intact and well preserved.

Upper chest: Negative.

Other: None
IMPRESSION: 1. Normal brain.
2. Marked soft tissue thickening and laceration at the posterior
left parietal scalp.
3. Negative for acute cervical spine fracture.
